# Patient Record
Sex: Female | Born: 1980 | Race: Black or African American | Hispanic: No | Marital: Married | Smoking: Never smoker
Health system: Southern US, Community
[De-identification: ages and names within clinical notes are randomized; demographics above are authoritative.]

## PROBLEM LIST (undated history)

## (undated) DIAGNOSIS — K219 Gastro-esophageal reflux disease without esophagitis: Secondary | ICD-10-CM

## (undated) DIAGNOSIS — Z1589 Genetic susceptibility to other disease: Secondary | ICD-10-CM

## (undated) DIAGNOSIS — K5900 Constipation, unspecified: Secondary | ICD-10-CM

## (undated) DIAGNOSIS — Z975 Presence of (intrauterine) contraceptive device: Secondary | ICD-10-CM

## (undated) DIAGNOSIS — F419 Anxiety disorder, unspecified: Secondary | ICD-10-CM

## (undated) DIAGNOSIS — G43909 Migraine, unspecified, not intractable, without status migrainosus: Secondary | ICD-10-CM

## (undated) HISTORY — PX: EYE SURGERY: SHX253

## (undated) HISTORY — DX: Presence of (intrauterine) contraceptive device: Z97.5

## (undated) HISTORY — PX: BREAST BIOPSY: SHX20

## (undated) HISTORY — PX: WISDOM TOOTH EXTRACTION: SHX21

## (undated) HISTORY — DX: Constipation, unspecified: K59.00

## (undated) HISTORY — DX: Genetic susceptibility to other disease: Z15.89

## (undated) HISTORY — DX: Anxiety disorder, unspecified: F41.9

## (undated) HISTORY — DX: Gastro-esophageal reflux disease without esophagitis: K21.9

---

## 1999-03-18 ENCOUNTER — Other Ambulatory Visit: Admission: RE | Admit: 1999-03-18 | Discharge: 1999-03-18 | Payer: Self-pay | Admitting: Obstetrics and Gynecology

## 2000-05-04 ENCOUNTER — Other Ambulatory Visit: Admission: RE | Admit: 2000-05-04 | Discharge: 2000-05-04 | Payer: Self-pay | Admitting: Obstetrics and Gynecology

## 2001-09-29 ENCOUNTER — Other Ambulatory Visit: Admission: RE | Admit: 2001-09-29 | Discharge: 2001-09-29 | Payer: Self-pay | Admitting: Obstetrics and Gynecology

## 2002-01-04 HISTORY — PX: EYE SURGERY: SHX253

## 2003-05-28 ENCOUNTER — Other Ambulatory Visit: Admission: RE | Admit: 2003-05-28 | Discharge: 2003-05-28 | Payer: Self-pay | Admitting: Obstetrics and Gynecology

## 2004-07-06 ENCOUNTER — Other Ambulatory Visit: Admission: RE | Admit: 2004-07-06 | Discharge: 2004-07-06 | Payer: Self-pay | Admitting: Obstetrics and Gynecology

## 2005-01-19 ENCOUNTER — Inpatient Hospital Stay (HOSPITAL_COMMUNITY): Admission: AD | Admit: 2005-01-19 | Discharge: 2005-01-21 | Payer: Self-pay | Admitting: Obstetrics and Gynecology

## 2006-10-20 ENCOUNTER — Ambulatory Visit: Payer: Self-pay | Admitting: Family Medicine

## 2006-10-20 DIAGNOSIS — G43009 Migraine without aura, not intractable, without status migrainosus: Secondary | ICD-10-CM | POA: Insufficient documentation

## 2006-10-21 ENCOUNTER — Telehealth: Payer: Self-pay | Admitting: Family Medicine

## 2007-01-26 ENCOUNTER — Telehealth: Payer: Self-pay | Admitting: Family Medicine

## 2007-02-09 ENCOUNTER — Ambulatory Visit: Payer: Self-pay | Admitting: Family Medicine

## 2007-02-09 DIAGNOSIS — G43909 Migraine, unspecified, not intractable, without status migrainosus: Secondary | ICD-10-CM

## 2007-02-09 DIAGNOSIS — J039 Acute tonsillitis, unspecified: Secondary | ICD-10-CM | POA: Insufficient documentation

## 2007-04-06 ENCOUNTER — Telehealth: Payer: Self-pay | Admitting: Family Medicine

## 2007-07-28 ENCOUNTER — Emergency Department (HOSPITAL_COMMUNITY): Admission: EM | Admit: 2007-07-28 | Discharge: 2007-07-28 | Payer: Self-pay | Admitting: Family Medicine

## 2008-02-16 ENCOUNTER — Telehealth: Payer: Self-pay | Admitting: Family Medicine

## 2008-12-12 ENCOUNTER — Ambulatory Visit: Payer: Self-pay | Admitting: Family Medicine

## 2009-03-04 ENCOUNTER — Ambulatory Visit: Payer: Self-pay | Admitting: Family Medicine

## 2009-03-04 DIAGNOSIS — H6120 Impacted cerumen, unspecified ear: Secondary | ICD-10-CM | POA: Insufficient documentation

## 2009-04-10 ENCOUNTER — Ambulatory Visit: Payer: Self-pay | Admitting: Family Medicine

## 2009-04-10 DIAGNOSIS — J309 Allergic rhinitis, unspecified: Secondary | ICD-10-CM | POA: Insufficient documentation

## 2009-07-24 ENCOUNTER — Ambulatory Visit: Payer: Self-pay | Admitting: Family Medicine

## 2009-07-24 DIAGNOSIS — L02419 Cutaneous abscess of limb, unspecified: Secondary | ICD-10-CM | POA: Insufficient documentation

## 2009-07-24 DIAGNOSIS — L03119 Cellulitis of unspecified part of limb: Secondary | ICD-10-CM

## 2009-09-23 ENCOUNTER — Telehealth: Payer: Self-pay | Admitting: Family Medicine

## 2009-12-22 ENCOUNTER — Telehealth: Payer: Self-pay | Admitting: Family Medicine

## 2009-12-24 ENCOUNTER — Telehealth: Payer: Self-pay | Admitting: Family Medicine

## 2010-02-03 NOTE — Progress Notes (Signed)
Summary: New Rx for Topamax  Phone Note Call from Patient Call back at Home Phone [phone]   Caller: Patient---live call Summary of Call: Pt like to go back on Topamax. Send to [facility] on [address]. Initial call taken by: Warnell Forester,  September 23, 2009 1:20 PM  Follow-up for Phone Call        Topamax 50 mg, dispense 30 tablets, directions one nightly follow-up office visit 3 weeks after starting medication.  No refills Follow-up by: Roderick Pee MD,  September 23, 2009 1:39 PM    New/Updated Medications: TOPAMAX 50 MG TABS (TOPIRAMATE) One tablet nightly  Call to make follow-up apt in 3 weeks after starting this medication Prescriptions: TOPAMAX 50 MG TABS (TOPIRAMATE) One tablet nightly  Call to make follow-up apt in 3 weeks after starting this medication  #50 x 0   Entered by:   Kathrynn Speed CMA   Authorized by:   Roderick Pee MD   Signed by:   Kathrynn Speed CMA on 09/23/2009   Method used:   Faxed to ...       [facility] [address] [phone]* (retail)       [address]       [place], [place]  [address]       Ph: [phone]       Fax: [phone]   RxID:   [id number]

## 2010-02-03 NOTE — Assessment & Plan Note (Signed)
Summary: severe abrasions on legs/some inf/swelling/cjr   Vital Signs:  Patient profile:   30 year old female Weight:      171 pounds Temp:     98.1 degrees F oral BP sitting:   130 / 90  (left arm) Cuff size:   regular  Vitals Entered By: Kathrynn Speed CMA (July 24, 2009 12:04 PM) CC: Severe abrasion on on both legs, right leg swollen & infection, x 5 days, src   CC:  Severe abrasion on on both legs, right leg swollen & infection, x 5 days, and src.  History of Present Illness: Mariah Ferrell is a 30 year old female, who comes in today for evaluation of an infected abrasion on her right lower extremity.  This past weekend.  She was at a water park in [place] fell out of the raft and abraded both lower extremities.  Everything is healing well, except she noticed the abrasion on her right lower extremity is getting red and tender  Current Medications (verified): 1)  Zomig Zmt 5 Mg  Tbdp (Zolmitriptan) .... Uad For Migraine 2)  Vicodin Es 7.5-750 Mg  Tabs (Hydrocodone-Acetaminophen) .... Take 1 Tablet By Mouth Three Times A Day Prn 3)  Promethazine Hcl 25 Mg  Tabs (Promethazine Hcl) .... Take 1 Tablet By Mouth Three Times A Day Prn 4)  Bcp .... Once Daily 5)  Corgard 40 Mg Tabs (Nadolol) .Marland Kitchen.. 1 Tab @ Bedtime 6)  Flonase 50 Mcg/act Susp (Fluticasone Propionate) .... Uad  Allergies (verified): 1)  ! Codeine  Past History:  Past medical, surgical, family and social histories (including risk factors) reviewed for relevance to current acute and chronic problems.  Past Medical History: Reviewed history from 12/12/2008 and no changes required. childbirth x 1, episodic migraines iritis  Family History: Reviewed history from 10/20/2006 and no changes required. cluster migraines  Social History: Reviewed history from 10/20/2006 and no changes required. Occupation:[employer], certified technician Married Never Smoked Alcohol use-no Drug use-no  Review of Systems       See HPI  Physical Exam  General:  Well-developed,well-nourished,in no acute distress; alert,appropriate and cooperative throughout examination Skin:  abrasion right knee, left lower extremity.  It looked normal, and healing well, good abrasion, right lower extremity has some erythema around the base, consistent with an early superficial cellulitis   Problems:  Medical Problems Added: 1)  Dx of Cellulitis, Right Leg  (ICD-682.6)  Impression & Recommendations:  Problem # 1:  CELLULITIS, RIGHT LEG (ICD-682.6) Assessment New  Her updated medication list for this problem includes:    Keflex 500 Mg Caps (Cephalexin) .Marland Kitchen... 2 by mouth two times a day  Orders: Prescription Created Electronically [phone])  Complete Medication List: 1)  Zomig Zmt 5 Mg Tbdp (Zolmitriptan) .... Uad for migraine 2)  Vicodin Es 7.5-750 Mg Tabs (Hydrocodone-acetaminophen) .... Take 1 tablet by mouth three times a day prn 3)  Promethazine Hcl 25 Mg Tabs (Promethazine hcl) .... Take 1 tablet by mouth three times a day prn 4)  Bcp  .... Once daily 5)  Corgard 40 Mg Tabs (Nadolol) .Marland Kitchen.. 1 tab @ bedtime 6)  Flonase 50 Mcg/act Susp (Fluticasone propionate) .... Uad 7)  Keflex 500 Mg Caps (Cephalexin) .... 2 by mouth two times a day  Patient Instructions: 1)  begin Keflex 2........ b.i.d. return p.r.n. Prescriptions: KEFLEX 500 MG CAPS (CEPHALEXIN) 2 by mouth two times a day  #40 x 1   Entered and Authorized by:   Roderick Pee MD   Signed  by:   Roderick Pee MD on 07/24/2009   Method used:   Electronically to        [facility] [phone]* (retail)       [address]       [place], [place]  [address]       Ph: [phone]       Fax: [phone]   RxID:   [id number]

## 2010-02-03 NOTE — Assessment & Plan Note (Signed)
Summary: HOARNESS/POST NASAL DRIP/COUGHING/BREATHING DIFF/CJR pt rsc a...   Vital Signs:  Patient profile:   30 year old female Weight:      168 pounds Temp:     98.2 degrees F oral BP sitting:   102 / 78  (left arm) Cuff size:   regular  Vitals Entered By: Kern Reap CMA Duncan Dull) (April 10, 2009 1:55 PM) CC: haorsness, cough   CC:  haorsness and cough.  History of Present Illness: Mariah Ferrell is a 30 year old female, nonsmoker, who comes in with a 4-day history of head congestion, postnasal drip, and cough.  She's had a history of allergic rhinitis in the past, but no asthma.  Allergies: 1)  ! Codeine  Past History:  Past medical, surgical, family and social histories (including risk factors) reviewed for relevance to current acute and chronic problems.  Past Medical History: Reviewed history from 12/12/2008 and no changes required. childbirth x 1, episodic migraines iritis  Family History: Reviewed history from 10/20/2006 and no changes required. cluster migraines  Social History: Reviewed history from 10/20/2006 and no changes required. Occupation:[employer], certified technician Married Never Smoked Alcohol use-no Drug use-no  Review of Systems      See HPI  Physical Exam  General:  Well-developed,well-nourished,in no acute distress; alert,appropriate and cooperative throughout examination Head:  Normocephalic and atraumatic without obvious abnormalities. No apparent alopecia or balding. Eyes:  No corneal or conjunctival inflammation noted. EOMI. Perrla. Funduscopic exam benign, without hemorrhages, exudates or papilledema. Vision grossly normal. Ears:  External ear exam shows no significant lesions or deformities.  Otoscopic examination reveals clear canals, tympanic membranes are intact bilaterally without bulging, retraction, inflammation or discharge. Hearing is grossly normal bilaterally. Nose:  septum in the midline, and 3+ nasal edema Mouth:   Oral mucosa and oropharynx without lesions or exudates.  Teeth in good repair. Neck:  No deformities, masses, or tenderness noted. Chest Wall:  No deformities, masses, or tenderness noted. Lungs:  Normal respiratory effort, chest expands symmetrically. Lungs are clear to auscultation, no crackles or wheezes.   Problems:  Medical Problems Added: 1)  Dx of Rhinitis  (ICD-477.9)  Impression & Recommendations:  Problem # 1:  RHINITIS (ICD-477.9) Assessment New  Her updated medication list for this problem includes:    Promethazine Hcl 25 Mg Tabs (Promethazine hcl) .Marland Kitchen... Take 1 tablet by mouth three times a day prn    Flonase 50 Mcg/act Susp (Fluticasone propionate) ..... Uad  Orders: Prescription Created Electronically [phone])  Complete Medication List: 1)  Zomig Zmt 5 Mg Tbdp (Zolmitriptan) .... Uad for migraine 2)  Vicodin Es 7.5-750 Mg Tabs (Hydrocodone-acetaminophen) .... Take 1 tablet by mouth three times a day prn 3)  Promethazine Hcl 25 Mg Tabs (Promethazine hcl) .... Take 1 tablet by mouth three times a day prn 4)  Bcp  .... Once daily 5)  Corgard 40 Mg Tabs (Nadolol) .Marland Kitchen.. 1 tab @ bedtime 6)  Flonase 50 Mcg/act Susp (Fluticasone propionate) .... Uad  Patient Instructions: 1)  u  may take plain Claritin in the morning, or  plain Zyrtec at bedtime.  Also, one shot of the steroid nasal spray up each nostril at bedtime. 2)  You may also irrigate urine this with warm salt water in a netti pot Prescriptions: FLONASE 50 MCG/ACT SUSP (FLUTICASONE PROPIONATE) UAD  #2 x 6   Entered and Authorized by:   Roderick Pee MD   Signed by:   Roderick Pee MD on 04/10/2009   Method used:  Electronically to        [facility] [phone]* (retail)       [address]       [place], [place]  [address]       Ph: [phone]       Fax: [phone]   RxID:   [id number]

## 2010-02-03 NOTE — Assessment & Plan Note (Signed)
Summary: right ear clogged/njr   Vital Signs:  Patient profile:   30 year old female Temp:     98.4 degrees F oral  Vitals Entered By: Gladis Riffle, RN (March 04, 2009 4:41 PM) CC: c/o right ear clog Is Patient Diabetic? No   CC:  c/o right ear clog.  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  She tried eardrops and flushing at home, but it didn't work.  Left ear normal  Preventive Screening-Counseling & Management  Alcohol-Tobacco     Alcohol type: rarely     Smoking Status: never  Medications Prior to Update: 1)  Zomig Zmt 5 Mg  Tbdp (Zolmitriptan) .... Uad For Migraine 2)  Vicodin Es 7.5-750 Mg  Tabs (Hydrocodone-Acetaminophen) .... Take 1 Tablet By Mouth Three Times A Day Prn 3)  Promethazine Hcl 25 Mg  Tabs (Promethazine Hcl) .... Take 1 Tablet By Mouth Three Times A Day Prn 4)  Bcp .... Once Daily 5)  Corgard 40 Mg Tabs (Nadolol) .Marland Kitchen.. 1 Tab @ Bedtime  Allergies: 1)  ! Codeine  Review of Systems      See HPI  Physical Exam  General:  Well-developed,well-nourished,in no acute distress; alert,appropriate and cooperative throughout examination Ears:  left ear canal normal right cerumen impaction removed via irrigation and suction by me   Impression & Recommendations:  Problem # 1:  CERUMEN IMPACTION, RIGHT (ICD-380.4) Assessment New  Orders: Cerumen Impaction Removal (09811)  Complete Medication List: 1)  Zomig Zmt 5 Mg Tbdp (Zolmitriptan) .... Uad for migraine 2)  Vicodin Es 7.5-750 Mg Tabs (Hydrocodone-acetaminophen) .... Take 1 tablet by mouth three times a day prn 3)  Promethazine Hcl 25 Mg Tabs (Promethazine hcl) .... Take 1 tablet by mouth three times a day prn 4)  Bcp  .... Once daily 5)  Corgard 40 Mg Tabs (Nadolol) .Marland Kitchen.. 1 tab @ bedtime

## 2010-02-05 NOTE — Progress Notes (Signed)
Summary: vicodin es  Phone Note From Pharmacy   Caller: [facility]. #[phone]* Summary of Call: pharmacy is calling to see if they should fill the viodin because the patient is allergic to codiene? Initial call taken by: Kern Reap CMA Duncan Dull),  December 24, 2009 12:21 PM  Follow-up for Phone Call        yes..........she's taking it before, with no side effects nor allergy symptoms Follow-up by: Roderick Pee MD,  December 24, 2009 12:51 PM  Additional Follow-up for Phone Call Additional follow up Details #1::        spoke with pharmacy Additional Follow-up by: Kern Reap CMA Duncan Dull),  December 24, 2009 1:10 PM

## 2010-02-05 NOTE — Progress Notes (Signed)
Summary: refill  Phone Note Refill Request Call back at Home Phone [phone] Message from:  Patient----live call  Refills Requested: Medication #1:  VICODIN ES 7.5-750 MG  TABS Take 1 tablet by mouth three times a day prn send to [facility]  Initial call taken by: Warnell Forester,  December 22, 2009 12:49 PM  Follow-up for Phone Call        Vicodin ES number 30 directions one half to one tablet t.i.d., p.r.n. for severe migraine, no refills.  She needs a 30 minute appointment in February for annual a weighted Follow-up by: Roderick Pee MD,  December 22, 2009 1:43 PM  Additional Follow-up for Phone Call Additional follow up Details #1::        Rx called to pharmacy Additional Follow-up by: Kern Reap CMA Duncan Dull),  December 22, 2009 1:49 PM

## 2010-05-22 NOTE — Discharge Summary (Signed)
NAMEKENNEDEY, DIGILIO              ACCOUNT NO.:  [id number]   MEDICAL RECORD NO.:  [id number]          PATIENT TYPE:  INP   LOCATION:  9101                          FACILITY:  [facility]   PHYSICIAN:  Huel Cote, M.D. DATE OF BIRTH:  [birth year]   DATE OF ADMISSION:  01/19/2005  DATE OF DISCHARGE:  01/21/2005                                 DISCHARGE SUMMARY   DISCHARGE DIAGNOSES:  1.  Term pregnancy at 39+ weeks, delivered.  2.  Status post normal spontaneous vaginal delivery.  3.  Group B strep positive status.  4.  Jehovah's witness, will not accept blood products.   DISCHARGE MEDICATIONS:  1.  Motrin 600 mg p.o. every six hours p.r.n.  2.  Darvocet one to two tablets p.o. every four hours p.r.n.   HOSPITAL COURSE:  The patient is a 30 year old G1, P0, who was admitted at  [redacted] weeks gestation for induction of labor given term status and favorable  cervix.  The patient was a strict Jehovah's witness and declined to take any  blood products even in a life threatening situation.  She was positive group  B strep status.  Otherwise, her prenatal care was unremarkable.   PRENATAL LABORATORY:  A positive, antibody negative, sickle normal, RPR  nonreactive, rubella immune, hepatitis B surface antigen negative, human  immunodeficiency virus nonreactive, GC negative, chlamydia negative, group B  strep positive.   PAST OBSTETRICAL HISTORY:  None.   PAST GYNECOLOGICAL HISTORY:  None.   PAST SURGICAL HISTORY:  Wisdom teeth and __________.   PAST MEDICAL HISTORY:  None.   ALLERGIES:  CODEINE.   MEDICATIONS:  None.   On admission, the patient was afebrile with stable vital signs.  Fetal heart  rate was reactive.  Cervix was 60 and 2 at a -2 station.  She was placed on  penicillin for her positive group B strep status and Pitocin to augment her  labor.  She reached complete dilation and pushed well less than 10 minutes  with a normal spontaneous vaginal delivery of a vigorous female  infant over  a small second degree laceration.  Nuchal x1 was reduced over the head.  There was a compound presentation of the right arm, which easily reduced.  Apgars were 8 and 9.  Weight was 6 pounds, 10  ounces.  Placenta delivered spontaneously.  After cord blood was obtained,  second degree laceration was repaired with 2-0 Vicryl.  Postpartum the  patient did extremely well.  Hemoglobin was 10.8 after delivery.  She had a  normal postpartum course, which was uneventful and was felt stable for  discharge home on postpartum day #2.      Huel Cote, M.D.  Electronically Signed     KR/MEDQ  D:  01/21/2005  T:  01/21/2005  Job:  [id number]

## 2012-05-22 ENCOUNTER — Ambulatory Visit (INDEPENDENT_AMBULATORY_CARE_PROVIDER_SITE_OTHER): Payer: BC Managed Care – PPO | Admitting: Family Medicine

## 2012-05-22 ENCOUNTER — Ambulatory Visit: Payer: BC Managed Care – PPO

## 2012-05-22 VITALS — BP 128/76 | HR 90 | Temp 98.3°F | Resp 16 | Ht 63.5 in | Wt 179.6 lb

## 2012-05-22 DIAGNOSIS — K5900 Constipation, unspecified: Secondary | ICD-10-CM

## 2012-05-22 DIAGNOSIS — R109 Unspecified abdominal pain: Secondary | ICD-10-CM

## 2012-05-22 LAB — POCT CBC
Hemoglobin: 12.2 g/dL (ref 12.2–16.2)
MCH, POC: 27 pg (ref 27–31.2)
MCV: 85.3 fL (ref 80–97)
MID (cbc): 0.5 (ref 0–0.9)
Platelet Count, POC: 241 10*3/uL (ref 142–424)
RBC: 4.52 M/uL (ref 4.04–5.48)
WBC: 6.2 10*3/uL (ref 4.6–10.2)

## 2012-05-22 LAB — POCT UA - MICROSCOPIC ONLY: Crystals, Ur, HPF, POC: NEGATIVE

## 2012-05-22 LAB — POCT URINALYSIS DIPSTICK
Ketones, UA: NEGATIVE
Protein, UA: NEGATIVE

## 2012-05-22 NOTE — Patient Instructions (Signed)
Drink lots of fluids.  Avoid excessive cheese bananas.  Eat lots of fruits and vegetables. Leafy green vegetables and melons and berries or especially good  Take MiraLax once or twice daily until stools were loose, then gradually decrease dose to as needed.  Take Dulcolax initially if needed to start the bowels moving better.  Take Colace one daily for stool softener.  If bowels are too were rock hard go ahead and use a fleets enema  Return if worse

## 2012-05-22 NOTE — Progress Notes (Signed)
Subjective: 32 year old lady who is here complaining of abdominal pains for the last 2 weeks. She had a couple of weeks ago on the weekend. She's been having a lot of trouble with constipation. She took some laxatives and finally got some relief. That seemed to help her. Gas-X and some other preparations did not seem to help. She has not had any abdominal surgeries. This weekend she got worse. She had cramping mid abdominal pain. She'll removed a little bit of hard stool. She tried magnesium sulfate and it still didn't work. She is on medicines for migraine including Topamax and baclofen. She is married, her husband has had a vasectomy, and she denies chance of pregnancy. Her daughter accompanied her here today.  Objective: Pleasant lady in no major distress. Her throat was clear. Neck supple without nodes. Chest clear. Heart regular without murmurs. Abdomen soft. Has generalized tenderness right upper and midabdomen and across lower and left abdomen. Bowel sounds were active.  Assessment: Abdominal pain Constipation  Plan: Check blood chemistries and labs and x-ray.  Results for orders placed in visit on 05/22/12  POCT UA - MICROSCOPIC ONLY      Result Value Range   WBC, Ur, HPF, POC 1-3     RBC, urine, microscopic 0-2     Bacteria, U Microscopic 1+     Mucus, UA neg     Epithelial cells, urine per micros 3-7     Crystals, Ur, HPF, POC neg     Casts, Ur, LPF, POC neg     Yeast, UA neg    POCT URINALYSIS DIPSTICK      Result Value Range   Color, UA amber     Clarity, UA clear     Glucose, UA neg     Bilirubin, UA large     Ketones, UA neg     Spec Grav, UA 1.015     Blood, UA trace-lysed     pH, UA 6.5     Protein, UA neg     Urobilinogen, UA 1.0     Nitrite, UA neg     Leukocytes, UA Trace    POCT CBC      Result Value Range   WBC 6.2  4.6 - 10.2 K/uL   Lymph, poc 1.8  0.6 - 3.4   POC LYMPH PERCENT 29.1  10 - 50 %L   MID (cbc) 0.5  0 - 0.9   POC MID % 7.4  0 - 12 %M   POC Granulocyte 3.9  2 - 6.9   Granulocyte percent 63.5  37 - 80 %G   RBC 4.52  4.04 - 5.48 M/uL   Hemoglobin 12.2  12.2 - 16.2 g/dL   HCT, POC 40.9  81.1 - 47.9 %   MCV 85.3  80 - 97 fL   MCH, POC 27.0  27 - 31.2 pg   MCHC 31.6 (*) 31.8 - 35.4 g/dL   RDW, POC 91.4     Platelet Count, POC 241  142 - 424 K/uL   MPV 9.3  0 - 99.8 fL   [facility] reading (PRIMARY) by  Dr. Alwyn Ren  benign-appearing abdomen. Lots of stool in colon.  Assessment: Abdominal pain secondary to constipation  Plan: Discussed treatment of the constipation with the patient. She is to return if not doing better.Marland Kitchen

## 2012-05-23 ENCOUNTER — Telehealth: Payer: Self-pay | Admitting: Radiology

## 2012-05-23 ENCOUNTER — Inpatient Hospital Stay (HOSPITAL_COMMUNITY)
Admission: EM | Admit: 2012-05-23 | Discharge: 2012-05-27 | DRG: 493 | Disposition: A | Discharge status: Home / Self Care | Payer: BC Managed Care – PPO | Attending: Internal Medicine | Admitting: Internal Medicine

## 2012-05-23 ENCOUNTER — Emergency Department (HOSPITAL_COMMUNITY): Payer: BC Managed Care – PPO

## 2012-05-23 ENCOUNTER — Encounter (HOSPITAL_COMMUNITY): Payer: Self-pay | Admitting: Emergency Medicine

## 2012-05-23 DIAGNOSIS — D62 Acute posthemorrhagic anemia: Secondary | ICD-10-CM | POA: Diagnosis not present

## 2012-05-23 DIAGNOSIS — K8051 Calculus of bile duct without cholangitis or cholecystitis with obstruction: Secondary | ICD-10-CM | POA: Diagnosis present

## 2012-05-23 DIAGNOSIS — N390 Urinary tract infection, site not specified: Secondary | ICD-10-CM | POA: Diagnosis present

## 2012-05-23 DIAGNOSIS — R7989 Other specified abnormal findings of blood chemistry: Secondary | ICD-10-CM | POA: Diagnosis present

## 2012-05-23 DIAGNOSIS — K8061 Calculus of gallbladder and bile duct with cholecystitis, unspecified, with obstruction: Principal | ICD-10-CM | POA: Diagnosis present

## 2012-05-23 DIAGNOSIS — R17 Unspecified jaundice: Secondary | ICD-10-CM | POA: Diagnosis present

## 2012-05-23 DIAGNOSIS — R1013 Epigastric pain: Secondary | ICD-10-CM | POA: Diagnosis present

## 2012-05-23 DIAGNOSIS — R7402 Elevation of levels of lactic acid dehydrogenase (LDH): Secondary | ICD-10-CM | POA: Diagnosis present

## 2012-05-23 DIAGNOSIS — R7401 Elevation of levels of liver transaminase levels: Secondary | ICD-10-CM | POA: Diagnosis present

## 2012-05-23 DIAGNOSIS — G43909 Migraine, unspecified, not intractable, without status migrainosus: Secondary | ICD-10-CM | POA: Diagnosis present

## 2012-05-23 DIAGNOSIS — K5900 Constipation, unspecified: Secondary | ICD-10-CM | POA: Diagnosis present

## 2012-05-23 DIAGNOSIS — K8050 Calculus of bile duct without cholangitis or cholecystitis without obstruction: Secondary | ICD-10-CM

## 2012-05-23 DIAGNOSIS — K8065 Calculus of gallbladder and bile duct with chronic cholecystitis with obstruction: Principal | ICD-10-CM | POA: Diagnosis present

## 2012-05-23 LAB — COMPREHENSIVE METABOLIC PANEL
ALT: 716 U/L — ABNORMAL HIGH (ref 0–35)
AST: 272 U/L — ABNORMAL HIGH (ref 0–37)
Alkaline Phosphatase: 370 U/L — ABNORMAL HIGH (ref 39–117)
BUN: 7 mg/dL (ref 6–23)
CO2: 24 mEq/L (ref 19–32)
CO2: 21 mEq/L (ref 19–32)
Calcium: 9.6 mg/dL (ref 8.4–10.5)
Chloride: 107 mEq/L (ref 96–112)
Chloride: 106 mEq/L (ref 96–112)
Creat: 0.88 mg/dL (ref 0.50–1.10)
Creatinine, Ser: 0.8 mg/dL (ref 0.50–1.10)
GFR calc non Af Amer: >90 mL/min (ref >90)
Glucose, Bld: 84 mg/dL (ref 70–99)
Potassium: 4 mEq/L (ref 3.5–5.1)
Sodium: 139 mEq/L (ref 135–145)
Total Bilirubin: 4 mg/dL — ABNORMAL HIGH (ref 0.3–1.2)

## 2012-05-23 LAB — URINALYSIS, ROUTINE W REFLEX MICROSCOPIC
Glucose, UA: NEGATIVE mg/dL
Hgb urine dipstick: NEGATIVE
Ketones, ur: 15 mg/dL — AB
Protein, ur: NEGATIVE mg/dL
pH: 5.5 (ref 5.0–8.0)

## 2012-05-23 LAB — CBC WITH DIFFERENTIAL/PLATELET
Basophils Absolute: 0.1 10*3/uL (ref 0.0–0.1)
HCT: 36.8 % (ref 36.0–46.0)
Hemoglobin: 12.2 g/dL (ref 12.0–15.0)
Lymphocytes Relative: 26 % (ref 12–46)
Monocytes Absolute: 0.6 10*3/uL (ref 0.1–1.0)
Neutro Abs: 4.1 10*3/uL (ref 1.7–7.7)
Neutrophils Relative %: 59 % (ref 43–77)
RDW: 14.2 % (ref 11.5–15.5)
WBC: 6.8 10*3/uL (ref 4.0–10.5)

## 2012-05-23 LAB — TSH: TSH: 1.178 u[IU]/mL (ref 0.350–4.500)

## 2012-05-23 LAB — URINE MICROSCOPIC-ADD ON

## 2012-05-23 MED ORDER — HYDROMORPHONE HCL PF 1 MG/ML IJ SOLN
0.5000 mg | Freq: Once | INTRAMUSCULAR | Status: AC
  Administered 2012-05-23: 0.5 mg via INTRAVENOUS
  Filled 2012-05-23: qty 1

## 2012-05-23 MED ORDER — ONDANSETRON HCL 4 MG/2ML IJ SOLN
4.0000 mg | INTRAMUSCULAR | Status: AC
  Administered 2012-05-23: 4 mg via INTRAVENOUS
  Filled 2012-05-23: qty 2

## 2012-05-23 MED ORDER — CEPHALEXIN 500 MG PO CAPS
500.0000 mg | ORAL_CAPSULE | Freq: Once | ORAL | Status: AC
  Administered 2012-05-23: 500 mg via ORAL
  Filled 2012-05-23: qty 1

## 2012-05-23 NOTE — ED Provider Notes (Signed)
History     CSN: [id number]  Arrival date & time 05/23/12  4098   First MD Initiated Contact with Patient 05/23/12 1959      Chief Complaint  Patient presents with  . Abdominal Pain  . Abnormal Labs     (Consider location/radiation/quality/duration/timing/severity/associated sxs/prior treatment) HPI Comments: Patient is a 32 year old female with no significant past medical history who presents for right upper [place] epigastric pain x10 days. Patient states the pain is "punching" in nature, has been gradually worsening since onset, is waxing waning in severity, and radiating to her mid back. Patient denies any alleviating factors of her pain, but states that eating makes her pain worse. Patient was seen at [facility] urgent care yesterday for the pain and found to have elevated AST and ALT levels. Patient had abdominal x-ray done which was significant for constipation. Patient states her last bowel movement was yesterday which was normal in color and consistency. Patient denies history of abdominal surgery. She further denies fever, chest pain, shortness of breath, vomiting, diarrhea, melena or hematochezia, urinary symptoms, and numbness or tingling in her extremities.  Patient is a 32 y.o. female presenting with abdominal pain. The history is provided by the patient. No language interpreter was used.  Abdominal Pain Associated symptoms include abdominal pain (RUQ) and nausea. Pertinent negatives include no chest pain, fever, numbness, vomiting or weakness.    History reviewed. No pertinent past medical history.  Past Surgical History  Procedure Laterality Date  . Eye surgery      Family History  Problem Relation Age of Onset  . Hypertension Mother     History  Substance Use Topics  . Smoking status: Never Smoker   . Smokeless tobacco: Not on file  . Alcohol Use: Yes    OB History   Grav Para Term Preterm Abortions TAB SAB Ect Mult Living                  Review of Systems   Constitutional: Negative for fever.  Respiratory: Negative for shortness of breath.   Cardiovascular: Negative for chest pain.  Gastrointestinal: Positive for nausea, abdominal pain (RUQ) and constipation (resolved). Negative for vomiting and diarrhea.  Genitourinary: Negative for dysuria and hematuria.  Skin: Negative for pallor.  Neurological: Negative for syncope, weakness and numbness.  All other systems reviewed and are negative.    Allergies  Codeine  Home Medications   Current Outpatient Rx  Name  Route  Sig  Dispense  Refill  . baclofen (LIORESAL) 10 MG tablet   Oral   Take 10 mg by mouth 3 (three) times daily as needed (for headaches).          . eletriptan (RELPAX) 20 MG tablet   Oral   One tablet by mouth at onset of headache. May repeat in 2 hours if headache persists or recurs. may repeat in 2 hours if necessary         . topiramate (TOPAMAX) 25 MG tablet   Oral   Take 75 mg by mouth at bedtime.            BP 140/84  Pulse 91  Temp(Src) 98.8 F (37.1 C) (Oral)  Resp 20  SpO2 100%  LMP 05/01/2012  Physical Exam  Nursing note and vitals reviewed. Constitutional: She is oriented to person, place, and time. She appears well-developed and well-nourished. No distress.  HENT:  Head: Normocephalic and atraumatic.  Mouth/Throat: Oropharynx is clear and moist. No oropharyngeal exudate.  Eyes: Conjunctivae  and EOM are normal. No scleral icterus.  Neck: Normal range of motion.  Cardiovascular: Normal rate, regular rhythm, normal heart sounds and intact distal pulses.   Pulmonary/Chest: Effort normal and breath sounds normal. No respiratory distress. She has no wheezes. She has no rales.  Abdominal: Soft. She exhibits no distension and no mass. There is tenderness (RUQ and epigatric region). There is no rebound and no guarding.  No peritoneal signs or palpable masses.  Musculoskeletal: Normal range of motion.  Neurological: She is alert and oriented to  person, place, and time.  Skin: Skin is warm and dry. No rash noted. She is not diaphoretic. No erythema. No pallor.  Psychiatric: She has a normal mood and affect. Her behavior is normal.    ED Course  Procedures (including critical care time)  Labs Reviewed  CBC WITH DIFFERENTIAL - Abnormal; Notable for the following:    Eosinophils Relative 6 (*)    All other components within normal limits  COMPREHENSIVE METABOLIC PANEL - Abnormal; Notable for the following:    AST 272 (*)    ALT 716 (*)    Alkaline Phosphatase 370 (*)    Total Bilirubin 4.0 (*)    All other components within normal limits  URINALYSIS, ROUTINE W REFLEX MICROSCOPIC - Abnormal; Notable for the following:    Color, Urine ORANGE (*)    APPearance CLOUDY (*)    Bilirubin Urine LARGE (*)    Ketones, ur 15 (*)    Urobilinogen, UA 4.0 (*)    Nitrite POSITIVE (*)    Leukocytes, UA SMALL (*)    All other components within normal limits  URINE MICROSCOPIC-ADD ON - Abnormal; Notable for the following:    Squamous Epithelial / LPF FEW (*)    Bacteria, UA FEW (*)    All other components within normal limits  URINE CULTURE  LIPASE, BLOOD  POCT PREGNANCY, URINE   US Abdomen Complete  05/23/2012   *RADIOLOGY REPORT*  Clinical Data:  Right upper quadrant pain.  Elevated liver function tests.  ABDOMINAL ULTRASOUND COMPLETE  Comparison:  None  Findings:  Gallbladder:  Multiple small less than 1 cm gallstones are seen. There is no evidence of gallbladder wall thickening or pericholecystic fluid.  Common Bile Duct:  Diffuse dilatation of the intrahepatic bile ducts is seen as well as the common bile duct.  The common bile duct measures up to 1.7 cm and at least one stone is seen within the distal common bile duct which measures approximately 7 mm.  Liver: No focal mass lesion identified.  Within normal limits in parenchymal echogenicity.  IVC:  Appears normal.  Pancreas:  No abnormality identified.  Spleen:  Within normal limits in  size and echotexture.  Right kidney:  Normal in size and parenchymal echogenicity.  No evidence of mass or hydronephrosis.  Left kidney:  Normal in size and parenchymal echogenicity.  No evidence of mass or hydronephrosis.  Abdominal Aorta:  No aneurysm identified.  IMPRESSION:  1.  Diffuse biliary ductal dilatation, with choledocholithiasis. 2.  Cholelithiasis, without other sonographic signs of acute cholecystitis.   Original Report Authenticated By: Myles Rosenthal, M.D.   Dg Abd 2 Views  05/22/2012   *RADIOLOGY REPORT*  Clinical Data: Abdominal pain  ABDOMEN - 2 VIEW  Comparison: None.  Findings: Upright film shows no evidence for intraperitoneal free air.  Supine film shows no gaseous bowel dilatation to suggest obstruction.  Prominent stool volume noted.  Visualized bony structures are normal.  IMPRESSION: Prominent stool  volume could be compatible with clinical constipation.   Original Report Authenticated By: Kennith Center, M.D.    1. UTI (urinary tract infection)   2. Choledocholithiasis     MDM  Patient is a 32 year old female with no significant past medical history who presents for right upper quadrant epigastric pain onset 10 days ago. Patient was seen in [place] urgent care and found to have elevated LFTs. Today AST 272, ALT 716. Patient also has elevated alkaline phosphatase to 370 and increased total bilirubin of 4.0. No leukocytosis. There is focal tenderness in the right upper quadrant epigastric region without peritoneal signs or palpable masses. Will obtain abdominal ultrasound to further evaluate for cholelithiasis.   Urine positive for leukocytes and nitrites; will tx with Keflex for UTI. Patient well appearing with no complaints.  U/S abdomen significant for diffuse biliary ductal dilatation. Common bile duct measuring 1.7 cm and distal common bile duct stone appreciated measuring 7 mm. No evidence of cholecystitis. Have consulted general surgery and GI to inform them of the patient.  Internal medicine to admit. Patient made NPO.     Antony Madura, PA-C 05/23/12 2333

## 2012-05-23 NOTE — ED Provider Notes (Signed)
Medical screening examination/treatment/procedure(s) were conducted as a shared visit with non-physician practitioner(s) and myself.  I personally evaluated the patient during the encounter   Delmi Fulfer, MD 05/23/12 2336 

## 2012-05-23 NOTE — ED Notes (Signed)
Pt states that she has been having abd pain off and on for 10 days.  Went to [facility] yesterday and had labs drawn and was sent here today for abnormal LFTs.  Denies NVD.  C/o constipation.  Had a BM yesterday but prior to that, it had been a week since her last one.

## 2012-05-23 NOTE — H&P (Signed)
[facility] History and Physical  MACIL CRADY HQI:[id number] DOB: 01-Dec-1980 DOA: 05/23/2012  Referring physician: er PCP: Evette Georges, MD  Specialists: Enid Baas  Chief Complaint: epigastric pain  HPI: Mariah Ferrell is a 32 y.o. female  no significant past medical history who presents for right upper epigastric pain x10 days. Patient states the pain is "punching" in nature, has been gradually worsening since onset, is waxing waning in severity, and radiating to her mid back. Patient denies any alleviating factors of her pain, but states that eating makes her pain worse. Patient was seen at [facility] urgent care yesterday for the pain and found to have elevated AST and ALT levels. Patient had abdominal x-ray done which was significant for constipation. Patient states her last bowel movement was yesterday which was normal in color and consistency.  She denies fever, chest pain, shortness of breath, vomiting, diarrhea, melena or hematochezia, urinary symptoms, and numbness or tingling in her extremities  In the ER, an U/s was done that was significant for diffuse biliary ductal dilatation. Common bile duct measuring 1.7 cm and distal common bile duct stone appreciated measuring 7 mm. No evidence of cholecystitis.  Gi consulted by Er but deferred admission to hospitalist.  Patient is a Jehovah's Witness- no blood products  Review of Systems: all systems reviewed, negative unless stated above   History reviewed. No pertinent past medical history. Past Surgical History  Procedure Laterality Date  . Eye surgery     Social History:  reports that she has never smoked. She does not have any smokeless tobacco history on file. She reports that  drinks alcohol. She reports that she does not use illicit drugs.   Allergies  Allergen Reactions  . Codeine     REACTION: nausea    Family History  Problem Relation Age of Onset  . Hypertension Mother     Prior to Admission  medications   Medication Sig Start Date End Date Taking? Authorizing Provider  baclofen (LIORESAL) 10 MG tablet Take 10 mg by mouth 3 (three) times daily as needed (for headaches).    Yes Historical Provider, MD  eletriptan (RELPAX) 20 MG tablet One tablet by mouth at onset of headache. May repeat in 2 hours if headache persists or recurs. may repeat in 2 hours if necessary   Yes Historical Provider, MD  topiramate (TOPAMAX) 25 MG tablet Take 75 mg by mouth at bedtime.    Yes Historical Provider, MD   Physical Exam: Filed Vitals:   05/23/12 1901  BP: 140/84  Pulse: 91  Temp: 98.8 F (37.1 C)  TempSrc: Oral  Resp: 20  SpO2: 100%     General: A+Ox3, NAD  Eyes: wnl  ENT: wnl  Neck: supple  Cardiovascular: rrr  Respiratory: clear anterior  Abdomen: +BS, soft, minimal RUQ tend  Skin: no rashes or lesions  Musculoskeletal: moves all 4 ext  Psychiatric: no SI/no HI, normal mood/affect  Neurologic: CN 2-12 intact  Labs on Admission:  Basic Metabolic Panel:  Recent Labs Lab 05/22/12 1226 05/23/12 1920  NA 138 139  K 4.2 4.0  CL 107 106  CO2 24 21  GLUCOSE 84 88  BUN 7 7  CREATININE 0.88 0.80  CALCIUM 9.6 9.1   Liver Function Tests:  Recent Labs Lab 05/22/12 1226 05/23/12 1920  AST 399* 272*  ALT 917* 716*  ALKPHOS 334* 370*  BILITOT 6.0* 4.0*  PROT 6.8 7.2  ALBUMIN 4.2 3.7    Recent Labs Lab 05/23/12 1920  LIPASE 26   No results found for this basename: AMMONIA,  in the last 168 hours CBC:  Recent Labs Lab 05/22/12 1229 05/23/12 1920  WBC 6.2 6.8  NEUTROABS  --  4.1  HGB 12.2 12.2  HCT 38.6 36.8  MCV 85.3 80.3  PLT  --  264   Cardiac Enzymes: No results found for this basename: CKTOTAL, CKMB, CKMBINDEX, TROPONINI,  in the last 168 hours  BNP (last 3 results) No results found for this basename: PROBNP,  in the last 8760 hours CBG: No results found for this basename: GLUCAP,  in the last 168 hours  Radiological Exams on  Admission: US Abdomen Complete  05/23/2012   *RADIOLOGY REPORT*  Clinical Data:  Right upper quadrant pain.  Elevated liver function tests.  ABDOMINAL ULTRASOUND COMPLETE  Comparison:  None  Findings:  Gallbladder:  Multiple small less than 1 cm gallstones are seen. There is no evidence of gallbladder wall thickening or pericholecystic fluid.  Common Bile Duct:  Diffuse dilatation of the intrahepatic bile ducts is seen as well as the common bile duct.  The common bile duct measures up to 1.7 cm and at least one stone is seen within the distal common bile duct which measures approximately 7 mm.  Liver: No focal mass lesion identified.  Within normal limits in parenchymal echogenicity.  IVC:  Appears normal.  Pancreas:  No abnormality identified.  Spleen:  Within normal limits in size and echotexture.  Right kidney:  Normal in size and parenchymal echogenicity.  No evidence of mass or hydronephrosis.  Left kidney:  Normal in size and parenchymal echogenicity.  No evidence of mass or hydronephrosis.  Abdominal Aorta:  No aneurysm identified.  IMPRESSION:  1.  Diffuse biliary ductal dilatation, with choledocholithiasis. 2.  Cholelithiasis, without other sonographic signs of acute cholecystitis.   Original Report Authenticated By: Myles Rosenthal, M.D.   Dg Abd 2 Views  05/22/2012   *RADIOLOGY REPORT*  Clinical Data: Abdominal pain  ABDOMEN - 2 VIEW  Comparison: None.  Findings: Upright film shows no evidence for intraperitoneal free air.  Supine film shows no gaseous bowel dilatation to suggest obstruction.  Prominent stool volume noted.  Visualized bony structures are normal.  IMPRESSION: Prominent stool volume could be compatible with clinical constipation.   Original Report Authenticated By: Kennith Center, M.D.      Assessment/Plan Active Problems:   Epigastric pain   UTI (lower urinary tract infection)   Constipation   Transaminitis   1. Epigastric/RUQ pain with stone in distal CBD- GI to see in the AM,  prob ERCP, NPO after midnight 2. UTI- rocephin 3. Constipation- resolved 4. transaminitis- see #1  GI to see in AM as they refused to admit  Code Status: full Family Communication: patient Disposition Plan: admit  Time spent: 70 min  Macenzie Burford [facility] Pager [phone]   If 7PM-7AM, please contact night-coverage [email or web address] Password [facility] 05/23/2012, 11:36 PM

## 2012-05-23 NOTE — Telephone Encounter (Signed)
Patient needs to go to the Emergency Room for her abdominal pain per Dr Alwyn Ren. Her liver function tests are very elevated, suggesting she has an acute cholecystitis. I have spoken to her to advise and she agrees to go. To you FYI

## 2012-05-23 NOTE — ED Notes (Signed)
U/S at pt bedside 

## 2012-05-24 ENCOUNTER — Encounter (HOSPITAL_COMMUNITY): Payer: Self-pay | Admitting: *Deleted

## 2012-05-24 DIAGNOSIS — K8020 Calculus of gallbladder without cholecystitis without obstruction: Secondary | ICD-10-CM

## 2012-05-24 DIAGNOSIS — N390 Urinary tract infection, site not specified: Secondary | ICD-10-CM

## 2012-05-24 DIAGNOSIS — R1013 Epigastric pain: Secondary | ICD-10-CM

## 2012-05-24 DIAGNOSIS — K8051 Calculus of bile duct without cholangitis or cholecystitis with obstruction: Secondary | ICD-10-CM | POA: Diagnosis present

## 2012-05-24 LAB — COMPREHENSIVE METABOLIC PANEL
BUN: 5 mg/dL — ABNORMAL LOW (ref 6–23)
CO2: 21 mEq/L (ref 19–32)
Chloride: 105 mEq/L (ref 96–112)
Creatinine, Ser: 0.79 mg/dL (ref 0.50–1.10)
GFR calc Af Amer: >90 mL/min (ref >90)
GFR calc non Af Amer: >90 mL/min (ref >90)
Glucose, Bld: 103 mg/dL — ABNORMAL HIGH (ref 70–99)
Total Bilirubin: 4.4 mg/dL — ABNORMAL HIGH (ref 0.3–1.2)

## 2012-05-24 LAB — URINE CULTURE: Colony Count: NO GROWTH

## 2012-05-24 LAB — CBC
MCH: 26.1 pg (ref 26.0–34.0)
Platelets: 226 10*3/uL (ref 150–400)
RBC: 4.25 MIL/uL (ref 3.87–5.11)
WBC: 6.5 10*3/uL (ref 4.0–10.5)

## 2012-05-24 MED ORDER — SENNOSIDES-DOCUSATE SODIUM 8.6-50 MG PO TABS
1.0000 | ORAL_TABLET | Freq: Every evening | ORAL | Status: DC | PRN
  Filled 2012-05-24: qty 1

## 2012-05-24 MED ORDER — ONDANSETRON HCL 4 MG/2ML IJ SOLN
4.0000 mg | Freq: Four times a day (QID) | INTRAMUSCULAR | Status: DC | PRN
  Administered 2012-05-24 (×2): 4 mg via INTRAVENOUS
  Filled 2012-05-24 (×3): qty 2

## 2012-05-24 MED ORDER — SENNOSIDES-DOCUSATE SODIUM 8.6-50 MG PO TABS
1.0000 | ORAL_TABLET | Freq: Two times a day (BID) | ORAL | Status: DC
  Administered 2012-05-24 – 2012-05-27 (×6): 1 via ORAL
  Filled 2012-05-24 (×8): qty 1

## 2012-05-24 MED ORDER — PROMETHAZINE HCL 25 MG/ML IJ SOLN
6.2500 mg | Freq: Four times a day (QID) | INTRAMUSCULAR | Status: DC | PRN
  Administered 2012-05-24 – 2012-05-25 (×4): 6.25 mg via INTRAVENOUS
  Filled 2012-05-24 (×5): qty 1

## 2012-05-24 MED ORDER — ENOXAPARIN SODIUM 40 MG/0.4ML ~~LOC~~ SOLN
40.0000 mg | SUBCUTANEOUS | Status: AC
  Administered 2012-05-24: 40 mg via SUBCUTANEOUS
  Filled 2012-05-24 (×2): qty 0.4

## 2012-05-24 MED ORDER — DEXTROSE 5 % IV SOLN
1.0000 g | Freq: Every day | INTRAVENOUS | Status: DC
  Administered 2012-05-24 (×2): 1 g via INTRAVENOUS
  Filled 2012-05-24 (×2): qty 10

## 2012-05-24 MED ORDER — BUTALBITAL-APAP-CAFFEINE 50-325-40 MG PO TABS
1.0000 | ORAL_TABLET | Freq: Once | ORAL | Status: AC
  Administered 2012-05-24: 1 via ORAL
  Filled 2012-05-24: qty 1

## 2012-05-24 MED ORDER — SODIUM CHLORIDE 0.9 % IV SOLN
INTRAVENOUS | Status: DC
  Administered 2012-05-24 – 2012-05-25 (×3): via INTRAVENOUS

## 2012-05-24 MED ORDER — TOPIRAMATE 25 MG PO TABS
75.0000 mg | ORAL_TABLET | Freq: Every day | ORAL | Status: DC
Start: 2012-05-24 — End: 2012-05-27
  Administered 2012-05-24 – 2012-05-26 (×3): 75 mg via ORAL
  Filled 2012-05-24 (×5): qty 3

## 2012-05-24 MED ORDER — HYDROMORPHONE HCL PF 1 MG/ML IJ SOLN
0.5000 mg | INTRAMUSCULAR | Status: DC | PRN
  Administered 2012-05-24 – 2012-05-25 (×5): 0.5 mg via INTRAVENOUS
  Filled 2012-05-24 (×6): qty 1

## 2012-05-24 MED ORDER — ELETRIPTAN HYDROBROMIDE 20 MG PO TABS
20.0000 mg | ORAL_TABLET | ORAL | Status: DC | PRN
  Administered 2012-05-24 (×2): 20 mg via ORAL
  Filled 2012-05-24 (×2): qty 1

## 2012-05-24 MED ORDER — ONDANSETRON HCL 4 MG PO TABS: 4.0000 mg | ORAL_TABLET | Freq: Four times a day (QID) | ORAL | Status: DC | PRN

## 2012-05-24 MED ORDER — BACLOFEN 10 MG PO TABS
10.0000 mg | ORAL_TABLET | Freq: Three times a day (TID) | ORAL | Status: DC | PRN
  Filled 2012-05-24: qty 1

## 2012-05-24 NOTE — Consult Note (Signed)
[facility] Consultation Note  Referring Provider: Dr. Marlin Canary [facility]) Primary Care Physician:  Evette Georges, MD  Reason for Consultation:  Elevated LFTs, gallstones, choledocholithiasis.  HPI: FARIN BUHMAN is a 32 y.o. female with about 10 days of post-prandial epigastric pain with radiation to back.  Symptoms progressively worsening.  No nausea, vomiting, fevers.  No blood in stool.  No prior GI problems.  Elevated LFTs, including bilirubin ~ 4.  Ultrasound showed gallstones as well as dilated CBD (1.7 cm) and evidence of at least one bile duct stone.   History reviewed. No pertinent past medical history.  Past Surgical History  Procedure Laterality Date  . Eye surgery      Prior to Admission medications   Medication Sig Start Date End Date Taking? Authorizing Provider  baclofen (LIORESAL) 10 MG tablet Take 10 mg by mouth 3 (three) times daily as needed (for headaches).    Yes Historical Provider, MD  eletriptan (RELPAX) 20 MG tablet One tablet by mouth at onset of headache. May repeat in 2 hours if headache persists or recurs. may repeat in 2 hours if necessary   Yes Historical Provider, MD  topiramate (TOPAMAX) 25 MG tablet Take 75 mg by mouth at bedtime.    Yes Historical Provider, MD    Current Facility-Administered Medications  Medication Dose Route Frequency Provider Last Rate Last Dose  . 0.9 %  sodium chloride infusion   Intravenous Continuous Joseph Art, DO 75 mL/hr at 05/24/12 0045    . baclofen (LIORESAL) tablet 10 mg  10 mg Oral TID PRN Joseph Art, DO      . cefTRIAXone (ROCEPHIN) 1 g in dextrose 5 % 50 mL IVPB  1 g Intravenous QHS Joseph Art, DO   1 g at 05/24/12 0104  . eletriptan (RELPAX) tablet 20 mg  20 mg Oral PRN Joseph Art, DO   20 mg at 05/24/12 0123  . enoxaparin (LOVENOX) injection 40 mg  40 mg Subcutaneous Q24H Joseph Art, DO      . HYDROmorphone (DILAUDID) injection 0.5 mg  0.5 mg Intravenous Q4H PRN Joseph Art,  DO   0.5 mg at 05/24/12 0715  . ondansetron (ZOFRAN) tablet 4 mg  4 mg Oral Q6H PRN Joseph Art, DO       Or  . ondansetron (ZOFRAN) injection 4 mg  4 mg Intravenous Q6H PRN Joseph Art, DO   4 mg at 05/24/12 0715  . senna-docusate (Senokot-S) tablet 1 tablet  1 tablet Oral QHS PRN Joseph Art, DO      . topiramate (TOPAMAX) tablet 75 mg  75 mg Oral QHS Joseph Art, DO        Allergies as of 05/23/2012 - Review Complete 05/23/2012  Allergen Reaction Noted  . Codeine  10/20/2006    Family History  Problem Relation Age of Onset  . Hypertension Mother     History   Social History  . Marital Status: Married    Spouse Name: N/A    Number of Children: N/A  . Years of Education: N/A   Occupational History  . Not on file.   Social History Main Topics  . Smoking status: Never Smoker   . Smokeless tobacco: Not on file  . Alcohol Use: Yes  . Drug Use: No  . Sexually Active: Yes   Other Topics Concern  . Not on file   Social History Narrative  . No narrative on file  Review of Systems: As per HPI, all others negative  Physical Exam: Vital signs in last 24 hours: Temp:  [98.2 F (36.8 C)-98.8 F (37.1 C)] 98.2 F (36.8 C) (05/21 0500) Pulse Rate:  [79-91] 79 (05/21 0500) Resp:  [18-20] 20 (05/21 0500) BP: (133-140)/(81-88) 133/88 mmHg (05/21 0500) SpO2:  [100 %] 100 % (05/21 0500) Weight:  [78.8 kg (173 lb 11.6 oz)] 78.8 kg (173 lb 11.6 oz) (05/21 0053)   General:  NAD, non-toxic appearing HEENT:  Sclera icteric.  Lab Results:  Recent Labs  05/23/12 1920 05/24/12 0350  WBC 6.8 6.5  HGB 12.2 11.1*  HCT 36.8 34.5*  PLT 264 226   BMET  Recent Labs  05/22/12 1226 05/23/12 1920 05/24/12 0350  NA 138 139 135  K 4.2 4.0 3.5  CL 107 106 105  CO2 24 21 21   GLUCOSE 84 88 103*  BUN 7 7 5*  CREATININE 0.88 0.80 0.79  CALCIUM 9.6 9.1 8.4   LFT  Recent Labs  05/24/12 0350  PROT 6.3  ALBUMIN 3.2*  AST 209*  ALT 570*  ALKPHOS 317*   BILITOT 4.4*   PT/INR No results found for this basename: LABPROT, INR,  in the last 72 hours  Studies/Results: US Abdomen Complete  05/23/2012   *RADIOLOGY REPORT*  Clinical Data:  Right upper quadrant pain.  Elevated liver function tests.  ABDOMINAL ULTRASOUND COMPLETE  Comparison:  None  Findings:  Gallbladder:  Multiple small less than 1 cm gallstones are seen. There is no evidence of gallbladder wall thickening or pericholecystic fluid.  Common Bile Duct:  Diffuse dilatation of the intrahepatic bile ducts is seen as well as the common bile duct.  The common bile duct measures up to 1.7 cm and at least one stone is seen within the distal common bile duct which measures approximately 7 mm.  Liver: No focal mass lesion identified.  Within normal limits in parenchymal echogenicity.  IVC:  Appears normal.  Pancreas:  No abnormality identified.  Spleen:  Within normal limits in size and echotexture.  Right kidney:  Normal in size and parenchymal echogenicity.  No evidence of mass or hydronephrosis.  Left kidney:  Normal in size and parenchymal echogenicity.  No evidence of mass or hydronephrosis.  Abdominal Aorta:  No aneurysm identified.  IMPRESSION:  1.  Diffuse biliary ductal dilatation, with choledocholithiasis. 2.  Cholelithiasis, without other sonographic signs of acute cholecystitis.   Original Report Authenticated By: Myles Rosenthal, M.D.   Dg Abd 2 Views  05/22/2012   *RADIOLOGY REPORT*  Clinical Data: Abdominal pain  ABDOMEN - 2 VIEW  Comparison: None.  Findings: Upright film shows no evidence for intraperitoneal free air.  Supine film shows no gaseous bowel dilatation to suggest obstruction.  Prominent stool volume noted.  Visualized bony structures are normal.  IMPRESSION: Prominent stool volume could be compatible with clinical constipation.   Original Report Authenticated By: Kennith Center, M.D.    Impression:  1.  Gallstones. 2.  Elevated LFTs with obstructive pattern on ultrasound. 3.   Biliary ductal diltation with evidence of bile duct stone(s).  Plan:  1.  Clear liquid diet today.  NPO after midnight. 2.  ERCP tomorrow for biliary sphincterotomy and likely bile duct stone extraction. 3.  Risks (up to and including bleeding, infection, perforation, pancreatitis that can be complicated by infected necrosis and death), benefits (removal of stones, alleviating blockage, decreasing risk of cholangitis or choledocholithiasis-related pancreatitis), and alternatives (watchful waiting, percutaneous transhepatic cholangiography) of ERCP were  explained to patient in detail and patient elects to proceed. 4.  Will need surgical consultation, post-ERCP, for consideration of cholecystectomy. 5.  It is noted that patient is a Jehovah's witness and will not accept blood products.   LOS: 1 day   Reeder Brisby M  05/24/2012, 8:21 AM

## 2012-05-24 NOTE — Consult Note (Signed)
Mariah Ferrell 1980/05/20  [id number].    Requesting MD: Dr. Trula Ore Rama Chief Complaint/Reason for Consult: CBD stone, abdominal pain HPI:  32 y.o. female with about 10 days of post-prandial epigastric pain with radiation to back. Symptoms progressively worsening and brought on by greasy/fatty foods. No nausea, vomiting, fevers/chills. No blood in stool. No prior GI problems other than constipation and "indigestion" from over-eating.  She went to a UC on 05/22/12 and they called her on 05/23/12 with the results of her labs: elevated LFTs, including bilirubin at 6.0 (now 4.4). Ultrasound showed gallstones as well as dilated CBD (1.7 cm) and evidence of at least one bile duct stone.  Dr. Dulce Sellar is on board with the patient and is planning on doing an ERCP with sphincterotomy tomorrow.  We were consulted regarding possible cholecystectomy after ERCP.    No history of abdominal surgery.  PMH positive for migraines, but otherwise healthy.  ROS: All systems reviewed and otherwise negative except for as above  Family History  Problem Relation Age of Onset  . Hypertension Mother     History reviewed. No pertinent past medical history.  Past Surgical History  Procedure Laterality Date  . Eye surgery      Social History:  reports that she has never smoked. She does not have any smokeless tobacco history on file. She reports that  drinks alcohol. She reports that she does not use illicit drugs.  Allergies:  Allergies  Allergen Reactions  . Codeine     REACTION: nausea    Medications Prior to Admission  Medication Sig Dispense Refill  . baclofen (LIORESAL) 10 MG tablet Take 10 mg by mouth 3 (three) times daily as needed (for headaches).       . eletriptan (RELPAX) 20 MG tablet One tablet by mouth at onset of headache. May repeat in 2 hours if headache persists or recurs. may repeat in 2 hours if necessary      . topiramate (TOPAMAX) 25 MG tablet Take 75 mg by mouth at bedtime.          Blood pressure 133/88, pulse 79, temperature 98.2 F (36.8 C), temperature source Oral, resp. rate 20, height 5\' 3"  (1.6 m), weight 78.8 kg (173 lb 11.6 oz), last menstrual period 05/01/2012, SpO2 100.00%. Physical Exam: General: pleasant, WD/WN AA female who is laying in bed in NAD HEENT: head is normocephalic, atraumatic.  Sclera are icteric.  PERRL.  Ears and nose without any masses or lesions.  Mouth is pink and moist Heart: regular, rate, and rhythm.  No obvious murmurs, gallops, or rubs noted.  Palpable pedal pulses bilaterally Lungs: CTAB, no wheezes, rhonchi, or rales noted.  Respiratory effort nonlabored Abd: soft, minimally tender in epigastric area, ND, +BS, no masses, hernias, or organomegaly MS: all 4 extremities are symmetrical with no cyanosis, clubbing, or edema Skin: warm and dry with no masses, lesions, or rashes Psych: A&Ox3 with an appropriate affect.  Results for orders placed during the hospital encounter of 05/23/12 (from the past 48 hour(s))  CBC WITH DIFFERENTIAL     Status: Abnormal   Collection Time    05/23/12  7:20 PM      Result Value Range   WBC 6.8  4.0 - 10.5 K/uL   RBC 4.58  3.87 - 5.11 MIL/uL   Hemoglobin 12.2  12.0 - 15.0 g/dL   HCT 40.9  81.1 - 91.4 %   MCV 80.3  78.0 - 100.0 fL   MCH 26.6  26.0 -  34.0 pg   MCHC 33.2  30.0 - 36.0 g/dL   RDW 86.5  78.4 - 69.6 %   Platelets 264  150 - 400 K/uL   Neutrophils Relative % 59  43 - 77 %   Neutro Abs 4.1  1.7 - 7.7 K/uL   Lymphocytes Relative 26  12 - 46 %   Lymphs Abs 1.8  0.7 - 4.0 K/uL   Monocytes Relative 8  3 - 12 %   Monocytes Absolute 0.6  0.1 - 1.0 K/uL   Eosinophils Relative 6 (*) 0 - 5 %   Eosinophils Absolute 0.4  0.0 - 0.7 K/uL   Basophils Relative 1  0 - 1 %   Basophils Absolute 0.1  0.0 - 0.1 K/uL  COMPREHENSIVE METABOLIC PANEL     Status: Abnormal   Collection Time    05/23/12  7:20 PM      Result Value Range   Sodium 139  135 - 145 mEq/L   Potassium 4.0  3.5 - 5.1 mEq/L    Chloride 106  96 - 112 mEq/L   CO2 21  19 - 32 mEq/L   Glucose, Bld 88  70 - 99 mg/dL   BUN 7  6 - 23 mg/dL   Creatinine, Ser 2.95  0.50 - 1.10 mg/dL   Calcium 9.1  8.4 - 28.4 mg/dL   Total Protein 7.2  6.0 - 8.3 g/dL   Albumin 3.7  3.5 - 5.2 g/dL   AST 132 (*) 0 - 37 U/L   ALT 716 (*) 0 - 35 U/L   Alkaline Phosphatase 370 (*) 39 - 117 U/L   Total Bilirubin 4.0 (*) 0.3 - 1.2 mg/dL   GFR calc non Af Amer >90  >90 mL/min   GFR calc Af Amer >90  >90 mL/min   Comment:            The eGFR has been calculated     using the CKD EPI equation.     This calculation has not been     validated in all clinical     situations.     eGFR's persistently     <90 mL/min signify     possible Chronic Kidney Disease.  LIPASE, BLOOD     Status: None   Collection Time    05/23/12  7:20 PM      Result Value Range   Lipase 26  11 - 59 U/L  URINALYSIS, ROUTINE W REFLEX MICROSCOPIC     Status: Abnormal   Collection Time    05/23/12  8:17 PM      Result Value Range   Color, Urine ORANGE (*) YELLOW   Comment: BIOCHEMICALS MAY BE AFFECTED BY COLOR   APPearance CLOUDY (*) CLEAR   Specific Gravity, Urine 1.023  1.005 - 1.030   pH 5.5  5.0 - 8.0   Glucose, UA NEGATIVE  NEGATIVE mg/dL   Hgb urine dipstick NEGATIVE  NEGATIVE   Bilirubin Urine LARGE (*) NEGATIVE   Ketones, ur 15 (*) NEGATIVE mg/dL   Protein, ur NEGATIVE  NEGATIVE mg/dL   Urobilinogen, UA 4.0 (*) 0.0 - 1.0 mg/dL   Nitrite POSITIVE (*) NEGATIVE   Leukocytes, UA SMALL (*) NEGATIVE  URINE MICROSCOPIC-ADD ON     Status: Abnormal   Collection Time    05/23/12  8:17 PM      Result Value Range   Squamous Epithelial / LPF FEW (*) RARE   WBC, UA 3-6  <3 WBC/hpf  Bacteria, UA FEW (*) RARE   Urine-Other MUCOUS PRESENT    POCT PREGNANCY, URINE     Status: None   Collection Time    05/23/12  8:21 PM      Result Value Range   Preg Test, Ur NEGATIVE  NEGATIVE   Comment:            THE SENSITIVITY OF THIS     METHODOLOGY IS >24 mIU/mL   COMPREHENSIVE METABOLIC PANEL     Status: Abnormal   Collection Time    05/24/12  3:50 AM      Result Value Range   Sodium 135  135 - 145 mEq/L   Potassium 3.5  3.5 - 5.1 mEq/L   Chloride 105  96 - 112 mEq/L   CO2 21  19 - 32 mEq/L   Glucose, Bld 103 (*) 70 - 99 mg/dL   BUN 5 (*) 6 - 23 mg/dL   Creatinine, Ser 7.82  0.50 - 1.10 mg/dL   Calcium 8.4  8.4 - 95.6 mg/dL   Total Protein 6.3  6.0 - 8.3 g/dL   Albumin 3.2 (*) 3.5 - 5.2 g/dL   AST 213 (*) 0 - 37 U/L   ALT 570 (*) 0 - 35 U/L   Alkaline Phosphatase 317 (*) 39 - 117 U/L   Total Bilirubin 4.4 (*) 0.3 - 1.2 mg/dL   GFR calc non Af Amer >90  >90 mL/min   GFR calc Af Amer >90  >90 mL/min   Comment:            The eGFR has been calculated     using the CKD EPI equation.     This calculation has not been     validated in all clinical     situations.     eGFR's persistently     <90 mL/min signify     possible Chronic Kidney Disease.  CBC     Status: Abnormal   Collection Time    05/24/12  3:50 AM      Result Value Range   WBC 6.5  4.0 - 10.5 K/uL   Comment: WHITE COUNT CONFIRMED ON SMEAR   RBC 4.25  3.87 - 5.11 MIL/uL   Hemoglobin 11.1 (*) 12.0 - 15.0 g/dL   HCT 08.6 (*) 57.8 - 46.9 %   MCV 81.2  78.0 - 100.0 fL   MCH 26.1  26.0 - 34.0 pg   MCHC 32.2  30.0 - 36.0 g/dL   RDW 62.9  52.8 - 41.3 %   Platelets 226  150 - 400 K/uL   US Abdomen Complete  05/23/2012   *RADIOLOGY REPORT*  Clinical Data:  Right upper quadrant pain.  Elevated liver function tests.  ABDOMINAL ULTRASOUND COMPLETE  Comparison:  None  Findings:  Gallbladder:  Multiple small less than 1 cm gallstones are seen. There is no evidence of gallbladder wall thickening or pericholecystic fluid.  Common Bile Duct:  Diffuse dilatation of the intrahepatic bile ducts is seen as well as the common bile duct.  The common bile duct measures up to 1.7 cm and at least one stone is seen within the distal common bile duct which measures approximately 7 mm.  Liver: No focal  mass lesion identified.  Within normal limits in parenchymal echogenicity.  IVC:  Appears normal.  Pancreas:  No abnormality identified.  Spleen:  Within normal limits in size and echotexture.  Right kidney:  Normal in size and parenchymal echogenicity.  No evidence of  mass or hydronephrosis.  Left kidney:  Normal in size and parenchymal echogenicity.  No evidence of mass or hydronephrosis.  Abdominal Aorta:  No aneurysm identified.  IMPRESSION:  1.  Diffuse biliary ductal dilatation, with choledocholithiasis. 2.  Cholelithiasis, without other sonographic signs of acute cholecystitis.   Original Report Authenticated By: Myles Rosenthal, M.D.   Dg Abd 2 Views  05/22/2012   *RADIOLOGY REPORT*  Clinical Data: Abdominal pain  ABDOMEN - 2 VIEW  Comparison: None.  Findings: Upright film shows no evidence for intraperitoneal free air.  Supine film shows no gaseous bowel dilatation to suggest obstruction.  Prominent stool volume noted.  Visualized bony structures are normal.  IMPRESSION: Prominent stool volume could be compatible with clinical constipation.   Original Report Authenticated By: Kennith Center, M.D.       Assessment/Plan Cholelithiasis with Choledocholelithiasis Postprandial epigastric pain Elevated LFT's (Hyperbilirubinemia &Transaminitis) CBD dilatation   Plan: 1.  Proceed with ERCP and sphincterotomy tomorrow 2.  Will discuss with Dr. Jamey Ripa about indication and timing of procedure but have time tomorrow afternoon or Friday, patient would like to have her GB out this admission if possible 3.  IVF, pain control, antiemetics 4.  Will avoid blood products per the patients wishes (Jahovah's witness) 5.  Ambulate and IS 5.  SCD's and lovenox   DORT, Lucca Ballo 05/24/2012, 10:46 AM Pager: [phone]

## 2012-05-24 NOTE — Consult Note (Signed)
Agree with A&P of MD,PA. Assuming the ERCP goes well tomorrow we will plan to recheck labs Friday am and proceed to lap chole on Friday. I have discussed the indications for laparoscopic cholecystectomy with her and provided educational material. We have discussed the risks of surgery, including general risks such as bleeding, infection, lung and heart issues etc. We have also discussed the potential for injuries to other organs, bile duct leaks, and other unexpected events. We have also talked about the fact that this may need to be converted to open under certain circumstances. We discussed the typical post op recovery and the fact that there is a good likelihood of improvement in symptoms and return to normal activity.  She understands this and wishes to proceed to schedule surgery. I believe all of her questions have been answered.

## 2012-05-24 NOTE — Progress Notes (Addendum)
[facility] PROGRESS NOTE  Mariah Ferrell ZOX:[id number] DOB: 1980/10/16 DOA: 05/23/2012 PCP: Evette Georges, MD  Brief narrative: Mariah Ferrell is an 32 y.o. female with no significant PMH who was admitted with biliary colic, transaminitis, and cholechodolithiasis.  She has been seen by Dr. Dulce Sellar of gastroenterology with plans to perform an ERCP 05/25/2012.  Assessment/Plan: Principal Problem:   Choledocholithiasis with obstruction and epigastric pain -Admitted and provided with supportive care including IV fluids, antinausea and pain medications. -Seen by gastroenterologist with plans to perform ERCP 05/25/2012. -Will need surgical consultation post ERCP for consideration of cholecystectomy.  Called this in. Active Problems:   UTI (lower urinary tract infection) -Continue empiric Rocephin. Followup urine culture results.   Constipation -Stool softeners as needed.   Transaminitis -Secondary to obstruction of the common bile duct from gallstones.  Code Status: Full. Family Communication: Husband updated at bedside. Disposition Plan: Home when stable.   Medical Consultants:  Dr. Willis Modena, Gastroenterology.  Other Consultants:  None.   Anti-infectives:  Rocephin 05/24/2012--->   HPI/Subjective: Mariah Ferrell is feeling okay at the moment. She has had Dilaudid-HP which has alleviated her abdominal pain. No nausea or vomiting. Tolerated a clear liquid breakfast without difficulty.  Objective: Filed Vitals:   05/23/12 1901 05/24/12 0053 05/24/12 0500  BP: 140/84 133/81 133/88  Pulse: 91 80 79  Temp: 98.8 F (37.1 C) 98.3 F (36.8 C) 98.2 F (36.8 C)  TempSrc: Oral Oral Oral  Resp: 20 18 20   Height:  5\' 3"  (1.6 m)   Weight:  78.8 kg (173 lb 11.6 oz)   SpO2: 100% 100% 100%    Intake/Output Summary (Last 24 hours) at 05/24/12 1008 Last data filed at 05/24/12 0600  Gross per 24 hour  Intake 443.75 ml  Output      1 ml  Net 442.75 ml     Exam: Gen:  NAD Cardiovascular:  RRR, No M/R/G Respiratory:  Lungs CTAB Gastrointestinal:  Abdomen soft, tender in the right upper quadrant. Extremities:  No C/E/C  Data Reviewed: Basic Metabolic Panel:  Recent Labs Lab 05/22/12 1226 05/23/12 1920 05/24/12 0350  NA 138 139 135  K 4.2 4.0 3.5  CL 107 106 105  CO2 24 21 21   GLUCOSE 84 88 103*  BUN 7 7 5*  CREATININE 0.88 0.80 0.79  CALCIUM 9.6 9.1 8.4   GFR Estimated Creatinine Clearance: 99.5 ml/min (by C-G formula based on Cr of 0.79). Liver Function Tests:  Recent Labs Lab 05/22/12 1226 05/23/12 1920 05/24/12 0350  AST 399* 272* 209*  ALT 917* 716* 570*  ALKPHOS 334* 370* 317*  BILITOT 6.0* 4.0* 4.4*  PROT 6.8 7.2 6.3  ALBUMIN 4.2 3.7 3.2*    Recent Labs Lab 05/23/12 1920  LIPASE 26   CBC:  Recent Labs Lab 05/23/12 1920 05/24/12 0350  WBC 6.8 6.5  NEUTROABS 4.1  --   HGB 12.2 11.1*  HCT 36.8 34.5*  MCV 80.3 81.2  PLT 264 226   Thyroid function studies  Recent Labs  05/22/12 1256  TSH 1.178   Microbiology No results found for this or any previous visit (from the past 240 hour(s)).   Procedures and Diagnostic Studies: US Abdomen Complete  05/23/2012   *RADIOLOGY REPORT*  Clinical Data:  Right upper quadrant pain.  Elevated liver function tests.  ABDOMINAL ULTRASOUND COMPLETE  Comparison:  None  Findings:  Gallbladder:  Multiple small less than 1 cm gallstones are seen. There is no evidence of gallbladder wall  thickening or pericholecystic fluid.  Common Bile Duct:  Diffuse dilatation of the intrahepatic bile ducts is seen as well as the common bile duct.  The common bile duct measures up to 1.7 cm and at least one stone is seen within the distal common bile duct which measures approximately 7 mm.  Liver: No focal mass lesion identified.  Within normal limits in parenchymal echogenicity.  IVC:  Appears normal.  Pancreas:  No abnormality identified.  Spleen:  Within normal limits in size and  echotexture.  Right kidney:  Normal in size and parenchymal echogenicity.  No evidence of mass or hydronephrosis.  Left kidney:  Normal in size and parenchymal echogenicity.  No evidence of mass or hydronephrosis.  Abdominal Aorta:  No aneurysm identified.  IMPRESSION:  1.  Diffuse biliary ductal dilatation, with choledocholithiasis. 2.  Cholelithiasis, without other sonographic signs of acute cholecystitis.   Original Report Authenticated By: Myles Rosenthal, M.D.   Dg Abd 2 Views  05/22/2012   *RADIOLOGY REPORT*  Clinical Data: Abdominal pain  ABDOMEN - 2 VIEW  Comparison: None.  Findings: Upright film shows no evidence for intraperitoneal free air.  Supine film shows no gaseous bowel dilatation to suggest obstruction.  Prominent stool volume noted.  Visualized bony structures are normal.  IMPRESSION: Prominent stool volume could be compatible with clinical constipation.   Original Report Authenticated By: Kennith Center, M.D.    Scheduled Meds: . cefTRIAXone (ROCEPHIN)  IV  1 g Intravenous QHS  . enoxaparin (LOVENOX) injection  40 mg Subcutaneous Q24H  . topiramate  75 mg Oral QHS   Continuous Infusions: . sodium chloride 75 mL/hr at 05/24/12 0045    Time spent: 25 minutes.   LOS: 1 day   Kialee Kham  [facility] Pager [phone].  If 8PM-8AM, please contact night-coverage at [email or web address], password [facility] 05/24/2012, 10:08 AM

## 2012-05-25 ENCOUNTER — Encounter (HOSPITAL_COMMUNITY)
Admission: EM | Disposition: A | Discharge status: Home / Self Care | Payer: Self-pay | Source: Home / Self Care | Attending: Internal Medicine

## 2012-05-25 ENCOUNTER — Encounter (HOSPITAL_COMMUNITY): Payer: Self-pay | Admitting: Certified Registered"

## 2012-05-25 ENCOUNTER — Inpatient Hospital Stay (HOSPITAL_COMMUNITY): Payer: BC Managed Care – PPO | Admitting: Certified Registered"

## 2012-05-25 ENCOUNTER — Inpatient Hospital Stay (HOSPITAL_COMMUNITY): Payer: BC Managed Care – PPO

## 2012-05-25 DIAGNOSIS — R945 Abnormal results of liver function studies: Secondary | ICD-10-CM

## 2012-05-25 HISTORY — PX: ERCP: SHX5425

## 2012-05-25 LAB — COMPREHENSIVE METABOLIC PANEL
ALT: 500 U/L — ABNORMAL HIGH (ref 0–35)
AST: 175 U/L — ABNORMAL HIGH (ref 0–37)
Alkaline Phosphatase: 330 U/L — ABNORMAL HIGH (ref 39–117)
CO2: 25 mEq/L (ref 19–32)
Calcium: 8.4 mg/dL (ref 8.4–10.5)
Chloride: 106 mEq/L (ref 96–112)
GFR calc non Af Amer: >90 mL/min (ref >90)
Potassium: 3.7 mEq/L (ref 3.5–5.1)
Sodium: 139 mEq/L (ref 135–145)

## 2012-05-25 LAB — SURGICAL PCR SCREEN: MRSA, PCR: NEGATIVE

## 2012-05-25 LAB — CBC
HCT: 35.8 % — ABNORMAL LOW (ref 36.0–46.0)
Hemoglobin: 11.3 g/dL — ABNORMAL LOW (ref 12.0–15.0)
MCH: 26.6 pg (ref 26.0–34.0)
MCHC: 31.6 g/dL (ref 30.0–36.0)

## 2012-05-25 SURGERY — ERCP, WITH INTERVENTION IF INDICATED
Anesthesia: General

## 2012-05-25 MED ORDER — ROCURONIUM BROMIDE 100 MG/10ML IV SOLN
INTRAVENOUS | Status: DC | PRN
  Administered 2012-05-25: 40 mg via INTRAVENOUS

## 2012-05-25 MED ORDER — PROMETHAZINE HCL 25 MG/ML IJ SOLN
INTRAMUSCULAR | Status: AC
  Filled 2012-05-25: qty 1

## 2012-05-25 MED ORDER — FENTANYL CITRATE 0.05 MG/ML IJ SOLN
100.0000 ug | Freq: Once | INTRAMUSCULAR | Status: DC
  Administered 2012-05-25 (×2): 25 ug via INTRAVENOUS

## 2012-05-25 MED ORDER — LIDOCAINE HCL 4 % MT SOLN
OROMUCOSAL | Status: DC | PRN
  Administered 2012-05-25: 4 mL via TOPICAL

## 2012-05-25 MED ORDER — CIPROFLOXACIN IN D5W 400 MG/200ML IV SOLN
400.0000 mg | Freq: Once | INTRAVENOUS | Status: AC
  Administered 2012-05-25: 400 mg via INTRAVENOUS
  Filled 2012-05-25: qty 200

## 2012-05-25 MED ORDER — NEOSTIGMINE METHYLSULFATE 1 MG/ML IJ SOLN
INTRAMUSCULAR | Status: DC | PRN
  Administered 2012-05-25: 3 mg via INTRAVENOUS

## 2012-05-25 MED ORDER — LACTATED RINGERS IV SOLN
INTRAVENOUS | Status: DC | PRN
  Administered 2012-05-25 (×2): via INTRAVENOUS

## 2012-05-25 MED ORDER — ARTIFICIAL TEARS OP OINT
TOPICAL_OINTMENT | OPHTHALMIC | Status: DC | PRN
  Administered 2012-05-25: 1 via OPHTHALMIC

## 2012-05-25 MED ORDER — SODIUM CHLORIDE 0.9 % IV SOLN: INTRAVENOUS | Status: DC

## 2012-05-25 MED ORDER — HYDROMORPHONE HCL PF 1 MG/ML IJ SOLN: 0.2500 mg | INTRAMUSCULAR | Status: DC | PRN

## 2012-05-25 MED ORDER — SCOPOLAMINE 1 MG/3DAYS TD PT72
MEDICATED_PATCH | TRANSDERMAL | Status: AC
  Filled 2012-05-25: qty 1

## 2012-05-25 MED ORDER — CHLORHEXIDINE GLUCONATE 4 % EX LIQD
1.0000 "application " | Freq: Once | CUTANEOUS | Status: AC
  Administered 2012-05-26: 1 via TOPICAL
  Filled 2012-05-25: qty 15

## 2012-05-25 MED ORDER — PROPOFOL 10 MG/ML IV BOLUS
INTRAVENOUS | Status: DC | PRN
  Administered 2012-05-25: 150 mg via INTRAVENOUS

## 2012-05-25 MED ORDER — LIDOCAINE HCL (CARDIAC) 20 MG/ML IV SOLN
INTRAVENOUS | Status: DC | PRN
  Administered 2012-05-25: 80 mg via INTRAVENOUS

## 2012-05-25 MED ORDER — LACTATED RINGERS IV SOLN
INTRAVENOUS | Status: DC
  Administered 2012-05-25 – 2012-05-26 (×2): via INTRAVENOUS

## 2012-05-25 MED ORDER — FENTANYL CITRATE 0.05 MG/ML IJ SOLN
INTRAMUSCULAR | Status: DC | PRN
  Administered 2012-05-25 (×2): 50 ug via INTRAVENOUS

## 2012-05-25 MED ORDER — CHLORHEXIDINE GLUCONATE 4 % EX LIQD
1.0000 "application " | Freq: Once | CUTANEOUS | Status: AC
  Administered 2012-05-25: 1 via TOPICAL
  Filled 2012-05-25: qty 15

## 2012-05-25 MED ORDER — PROMETHAZINE HCL 25 MG/ML IJ SOLN
6.2500 mg | INTRAMUSCULAR | Status: DC | PRN
  Administered 2012-05-25: 12.5 mg via INTRAVENOUS

## 2012-05-25 MED ORDER — SCOPOLAMINE 1 MG/3DAYS TD PT72
MEDICATED_PATCH | TRANSDERMAL | Status: DC | PRN
  Administered 2012-05-25: 1 via TRANSDERMAL

## 2012-05-25 MED ORDER — ONDANSETRON HCL 4 MG/2ML IJ SOLN
INTRAMUSCULAR | Status: DC | PRN
  Administered 2012-05-25 (×2): 4 mg via INTRAVENOUS

## 2012-05-25 MED ORDER — MIDAZOLAM HCL 5 MG/5ML IJ SOLN
INTRAMUSCULAR | Status: DC | PRN
  Administered 2012-05-25: 2 mg via INTRAVENOUS

## 2012-05-25 MED ORDER — SODIUM CHLORIDE 0.9 % IV SOLN
INTRAVENOUS | Status: DC | PRN
  Administered 2012-05-25 (×2)

## 2012-05-25 MED ORDER — GLYCOPYRROLATE 0.2 MG/ML IJ SOLN
INTRAMUSCULAR | Status: DC | PRN
  Administered 2012-05-25: 0.4 mg via INTRAVENOUS

## 2012-05-25 MED ORDER — FENTANYL CITRATE 0.05 MG/ML IJ SOLN
INTRAMUSCULAR | Status: AC
  Administered 2012-05-25: 50 ug via INTRAVENOUS
  Filled 2012-05-25: qty 2

## 2012-05-25 NOTE — Progress Notes (Signed)
Day of Surgery  Subjective: Pt feeling fine today, minimal abdominal pain, no N/V.  Ready to have her procedure.  Its being moved to [facility] since the [facility] was out in the OR.  She will return today and have her lap chole here at [facility] tomorrow.  Pt ambulating well, +flatus, no BM yet.    Objective: Vital signs in last 24 hours: Temp:  [98 F (36.7 C)-98.5 F (36.9 C)] 98 F (36.7 C) (05/22 0610) Pulse Rate:  [64-71] 65 (05/22 0610) Resp:  [18-20] 20 (05/22 0610) BP: (124-126)/(70-85) 126/81 mmHg (05/22 0610) SpO2:  [100 %] 100 % (05/22 0610) Last BM Date: 05/22/12  Intake/Output from previous day: 05/21 0701 - 05/22 0700 In: 2582.5 [P.O.:720; I.V.:1862.5] Out: 2150 [Urine:2150] Intake/Output this shift:    PE: Gen:  Alert, NAD, pleasant Abd: Soft, minimal tenderness in epigastrium, ND, +BS, no HSM   Lab Results:   Recent Labs  05/24/12 0350 05/25/12 0419  WBC 6.5 4.7  HGB 11.1* 11.3*  HCT 34.5* 35.8*  PLT 226 252   BMET  Recent Labs  05/24/12 0350 05/25/12 0419  NA 135 139  K 3.5 3.7  CL 105 106  CO2 21 25  GLUCOSE 103* 86  BUN 5* 3*  CREATININE 0.79 0.71  CALCIUM 8.4 8.4   PT/INR No results found for this basename: LABPROT, INR,  in the last 72 hours CMP     Component Value Date/Time   NA 139 05/25/2012 0419   K 3.7 05/25/2012 0419   CL 106 05/25/2012 0419   CO2 25 05/25/2012 0419   GLUCOSE 86 05/25/2012 0419   BUN 3* 05/25/2012 0419   CREATININE 0.71 05/25/2012 0419   CREATININE 0.88 05/22/2012 1226   CALCIUM 8.4 05/25/2012 0419   PROT 6.3 05/25/2012 0419   ALBUMIN 3.1* 05/25/2012 0419   AST 175* 05/25/2012 0419   ALT 500* 05/25/2012 0419   ALKPHOS 330* 05/25/2012 0419   BILITOT 2.1* 05/25/2012 0419   GFRNONAA >90 05/25/2012 0419   GFRAA >90 05/25/2012 0419   Lipase     Component Value Date/Time   LIPASE 26 05/23/2012 1920       Studies/Results: US Abdomen Complete  05/23/2012   *RADIOLOGY REPORT*  Clinical Data:  Right upper quadrant pain.  Elevated  liver function tests.  ABDOMINAL ULTRASOUND COMPLETE  Comparison:  None  Findings:  Gallbladder:  Multiple small less than 1 cm gallstones are seen. There is no evidence of gallbladder wall thickening or pericholecystic fluid.  Common Bile Duct:  Diffuse dilatation of the intrahepatic bile ducts is seen as well as the common bile duct.  The common bile duct measures up to 1.7 cm and at least one stone is seen within the distal common bile duct which measures approximately 7 mm.  Liver: No focal mass lesion identified.  Within normal limits in parenchymal echogenicity.  IVC:  Appears normal.  Pancreas:  No abnormality identified.  Spleen:  Within normal limits in size and echotexture.  Right kidney:  Normal in size and parenchymal echogenicity.  No evidence of mass or hydronephrosis.  Left kidney:  Normal in size and parenchymal echogenicity.  No evidence of mass or hydronephrosis.  Abdominal Aorta:  No aneurysm identified.  IMPRESSION:  1.  Diffuse biliary ductal dilatation, with choledocholithiasis. 2.  Cholelithiasis, without other sonographic signs of acute cholecystitis.   Original Report Authenticated By: Myles Rosenthal, M.D.    Anti-infectives: Anti-infectives   Start     Dose/Rate Route Frequency Ordered  Stop   05/24/12 0045  cefTRIAXone (ROCEPHIN) 1 g in dextrose 5 % 50 mL IVPB     1 g 100 mL/hr over 30 Minutes Intravenous Daily at bedtime 05/24/12 0033     05/23/12 2200  cephALEXin (KEFLEX) capsule 500 mg     500 mg Oral  Once 05/23/12 2145 05/23/12 2230       Assessment/Plan Cholelithiasis with Choledocholelithiasis  Postprandial epigastric pain  Elevated LFT's (Hyperbilirubinemia &Transaminitis)  CBD dilatation   Plan:  1. Proceed with ERCP and sphincterotomy today at [facility] 2. OR tomorrow for Lap chole with IOC 3. IVF, pain control, antiemetics  4. Will avoid blood products per the patients wishes (Jahovah's witness), pt asks if a cell saver can be used and will allow epo drugs if  needed 5. Ambulate and IS  5. SCD's and lovenox will order to hold for surgery     LOS: 2 days    DORT, [facility] 05/25/2012, 9:08 AM Pager: [phone]

## 2012-05-25 NOTE — Interval H&P Note (Signed)
History and Physical Interval Note:  05/25/2012 1:05 PM  Mariah Ferrell  has presented today for surgery, with the diagnosis of STONES  The various methods of treatment have been discussed with the patient and family. After consideration of risks, benefits and other options for treatment, the patient has consented to  Procedure(s): ENDOSCOPIC RETROGRADE CHOLANGIOPANCREATOGRAPHY (ERCP) (N/A) as a surgical intervention .  The patient's history has been reviewed, patient examined, no change in status, stable for surgery.  I have reviewed the patient's chart and labs.  Questions were answered to the patient's satisfaction.     Willer Osorno M  Assessment:  1.  Dilated bile duct. 2.  Elevated LFTs. 3.  Gallstones. 4.  Bile duct stone(s) seen on abdominal ultrasound  Plan:  1.  ERCP with biliary sphincterotomy and possible bile duct stone extraction. 2.  Risks (up to and including bleeding, infection, perforation, pancreatitis that can be complicated by infected necrosis and death), benefits (removal of stones, alleviating blockage, decreasing risk of cholangitis or choledocholithiasis-related pancreatitis), and alternatives (watchful waiting, percutaneous transhepatic cholangiography) of ERCP were explained to patient in detail and patient elects to proceed.

## 2012-05-25 NOTE — Progress Notes (Signed)
Agree with A&P of MD,PA. Patient very comfortable. Reviewed plans risks etc again today. Will check labs in am and plan surgery tomorrow. Currently scheduled for 11:00

## 2012-05-25 NOTE — Progress Notes (Signed)
[facility] PROGRESS NOTE  [name] XBM:[id number] DOB: [birth year] DOA: 05/23/2012 PCP: Evette Georges, MD  Brief narrative: Mariah Ferrell is an 32 y.o. female with no significant PMH who was admitted with biliary colic, transaminitis, and cholechodolithiasis.  She has been seen by Dr. Dulce Sellar of gastroenterology with plans to perform an ERCP 05/25/2012 and by Dr. Jamey Ripa of general surgery with plans to undergo laparoscopic cholecystectomy on 05/26/2012.  Assessment/Plan: Principal Problem:   Choledocholithiasis with obstruction and epigastric pain -Admitted and provided with supportive care including IV fluids, antinausea and pain medications. -Seen by gastroenterologist with plans to perform ERCP 05/25/2012. -Elective cholecystectomy tentatively scheduled for 05/26/2012. Active Problems:   Migraine headache -May be induced by Zofran. Zofran discontinued. Continue Relpax PRN.   UTI (lower urinary tract infection) -Urine culture negative, discontinue empiric Rocephin.    Constipation -Stool softeners as needed.   Transaminitis -Secondary to obstruction of the common bile duct from gallstones.  Code Status: Full. Family Communication: Husband updated at bedside. Disposition Plan: Home when stable.   Medical Consultants:  Dr. Willis Modena, Gastroenterology.  Other Consultants:  None.   Anti-infectives:  Rocephin 05/24/2012--->   HPI/Subjective: Mariah Ferrell reports that her pain and nausea are under control. No new complaints today.  Objective: Filed Vitals:   05/24/12 0500 05/24/12 1500 05/24/12 2254 05/25/12 0610  BP: 133/88 124/85 124/70 126/81  Pulse: 79 71 64 65  Temp: 98.2 F (36.8 C) 98.5 F (36.9 C) 98.1 F (36.7 C) 98 F (36.7 C)  TempSrc: Oral Oral Oral Oral  Resp: 20 18 20 20   Height:      Weight:      SpO2: 100% 100% 100% 100%    Intake/Output Summary (Last 24 hours) at 05/25/12 0945 Last data filed at 05/25/12 0900  Gross  per 24 hour  Intake 2582.5 ml  Output   2750 ml  Net -167.5 ml    Exam: Gen:  NAD Cardiovascular:  RRR, No M/R/G Respiratory:  Lungs CTAB Gastrointestinal:  Abdomen soft, tender in the right upper quadrant. Extremities:  No C/E/C  Data Reviewed: Basic Metabolic Panel:  Recent Labs Lab 05/22/12 1226 05/23/12 1920 05/24/12 0350 05/25/12 0419  NA 138 139 135 139  K 4.2 4.0 3.5 3.7  CL 107 106 105 106  CO2 24 21 21 25   GLUCOSE 84 88 103* 86  BUN 7 7 5* 3*  CREATININE 0.88 0.80 0.79 0.71  CALCIUM 9.6 9.1 8.4 8.4   GFR Estimated Creatinine Clearance: 99.5 ml/min (by C-G formula based on Cr of 0.71). Liver Function Tests:  Recent Labs Lab 05/22/12 1226 05/23/12 1920 05/24/12 0350 05/25/12 0419  AST 399* 272* 209* 175*  ALT 917* 716* 570* 500*  ALKPHOS 334* 370* 317* 330*  BILITOT 6.0* 4.0* 4.4* 2.1*  PROT 6.8 7.2 6.3 6.3  ALBUMIN 4.2 3.7 3.2* 3.1*    Recent Labs Lab 05/23/12 1920  LIPASE 26   CBC:  Recent Labs Lab 05/23/12 1920 05/24/12 0350 05/25/12 0419  WBC 6.8 6.5 4.7  NEUTROABS 4.1  --   --   HGB 12.2 11.1* 11.3*  HCT 36.8 34.5* 35.8*  MCV 80.3 81.2 84.2  PLT 264 226 252   Thyroid function studies  Recent Labs  05/22/12 1256  TSH 1.178   Microbiology Recent Results (from the past 240 hour(s))  URINE CULTURE     Status: None   Collection Time    05/23/12  8:17 PM      Result Value  Range Status   Specimen Description URINE, CLEAN CATCH   Final   Special Requests NONE   Final   Culture  Setup Time 05/24/2012 01:31   Final   Colony Count NO GROWTH   Final   Culture NO GROWTH   Final   Report Status 05/24/2012 FINAL   Final     Procedures and Diagnostic Studies: US Abdomen Complete  05/23/2012   *RADIOLOGY REPORT*  Clinical Data:  Right upper quadrant pain.  Elevated liver function tests.  ABDOMINAL ULTRASOUND COMPLETE  Comparison:  None  Findings:  Gallbladder:  Multiple small less than 1 cm gallstones are seen. There is no  evidence of gallbladder wall thickening or pericholecystic fluid.  Common Bile Duct:  Diffuse dilatation of the intrahepatic bile ducts is seen as well as the common bile duct.  The common bile duct measures up to 1.7 cm and at least one stone is seen within the distal common bile duct which measures approximately 7 mm.  Liver: No focal mass lesion identified.  Within normal limits in parenchymal echogenicity.  IVC:  Appears normal.  Pancreas:  No abnormality identified.  Spleen:  Within normal limits in size and echotexture.  Right kidney:  Normal in size and parenchymal echogenicity.  No evidence of mass or hydronephrosis.  Left kidney:  Normal in size and parenchymal echogenicity.  No evidence of mass or hydronephrosis.  Abdominal Aorta:  No aneurysm identified.  IMPRESSION:  1.  Diffuse biliary ductal dilatation, with choledocholithiasis. 2.  Cholelithiasis, without other sonographic signs of acute cholecystitis.   Original Report Authenticated By: Myles Rosenthal, M.D.   Dg Abd 2 Views  05/22/2012   *RADIOLOGY REPORT*  Clinical Data: Abdominal pain  ABDOMEN - 2 VIEW  Comparison: None.  Findings: Upright film shows no evidence for intraperitoneal free air.  Supine film shows no gaseous bowel dilatation to suggest obstruction.  Prominent stool volume noted.  Visualized bony structures are normal.  IMPRESSION: Prominent stool volume could be compatible with clinical constipation.   Original Report Authenticated By: Kennith Center, M.D.    Scheduled Meds: . cefTRIAXone (ROCEPHIN)  IV  1 g Intravenous QHS  . senna-docusate  1 tablet Oral BID  . topiramate  75 mg Oral QHS   Continuous Infusions: . sodium chloride 75 mL/hr at 05/25/12 0317  . sodium chloride      Time spent: 25 minutes.   LOS: 2 days   Twylia Oka  [facility] Pager [phone].  If 8PM-8AM, please contact night-coverage at [email or web address], password [facility] 05/25/2012, 9:45 AM

## 2012-05-25 NOTE — Op Note (Signed)
[facility] [address] [place], [address]   ERCP PROCEDURE REPORT  PATIENT: Mariah Ferrell, Mariah Ferrell.  MR# :[id number] BIRTHDATE: 06/16/1980  GENDER: Female ENDOSCOPIST: Willis Modena, MD REFERRED BY: [facility] PROCEDURE DATE:  05/25/2012 PROCEDURE:   ERCP with sphincterotomy/papillotomy and ERCP with removal of calculus/calculi ASA CLASS:    ASA-I INDICATIONS:  Bile duct stones (seen on abdominal ultrasound); dilated bile duct; gallstones; elevated LFTs MEDICATIONS:    General endotracheal anesthesia (GETA); Ciprofloxacin 400 mg IV  DESCRIPTION OF PROCEDURE:   After the risks benefits and alternatives of the procedure were thoroughly explained, informed consent was obtained.  The Pentax Ercp Scope [id number]  endoscope was introduced through the mouth and advanced to the second portion of the duodenum .     FINDINGS:  Normal-appearing ampulla with scant biliary drainage pre-instrumentation.  Deep biliary access achieved.  Bile duct about 10mm in diameter with hazy filling defects in the distal bile duct, consistent with small stones.  Moderate biliary sphincterotomy performed with blended current without immediate complication.  Trapezoidal basket used to extract several small triangular light-green colored stones.  Post-occlusion cholangiogram showed no obvious residual filling defects, with gradual tapering of the distal bile duct without obvious evidence of biliary stricture.  There was some periampullary edema post stone extraction and sphincterotomy, but there was fairly good bile flow per ampulla post-instrumentation.  I feel the periampullary edema will resolve with time, and did not feel there was need for bile duct stent placement.  Pancreatogram was not obtained, intentionally.  ENDOSCOPIC IMPRESSION:  As above.  Successful removal of several bile duct stones.  RECOMMENDATIONS: 1.  Watch for potential complications of  procedure. 2.  No ASA/NSAIDs x 5 days post-sphincterotomy. 3.  Clear liquid diet today, NPO after midnight. 4.  If no obvious complications after today's procedure, would consider cholecystectomy (tentatively planned for tomorrow, 05/26/12).     _______________________________ Rosalie DoctorWillis Modena, MD 05/25/2012 4:07 PM   CC:

## 2012-05-25 NOTE — Transfer of Care (Signed)
Immediate Anesthesia Transfer of Care Note  Patient: Mariah Ferrell  Procedure(s) Performed: Procedure(s): ENDOSCOPIC RETROGRADE CHOLANGIOPANCREATOGRAPHY (ERCP) (N/A)  Patient Location: PACU  Anesthesia Type:General  Level of Consciousness: awake  Airway & Oxygen Therapy: Patient Spontanous Breathing and Patient connected to nasal cannula oxygen  Post-op Assessment: Report given to PACU RN and Post -op Vital signs reviewed and stable  Post vital signs: Reviewed and stable  Complications: No apparent anesthesia complications

## 2012-05-25 NOTE — Anesthesia Preprocedure Evaluation (Addendum)
Anesthesia Evaluation  Patient identified by MRN, date of birth, ID band Patient awake    Reviewed: Allergy & Precautions, H&P , NPO status , Patient's Chart, lab work & pertinent test results, reviewed documented beta blocker date and time   Airway Mallampati: I TM Distance: >3 FB Neck ROM: Full    Dental  (+) Dental Advisory Given and Teeth Intact   Pulmonary  breath sounds clear to auscultation        Cardiovascular negative cardio ROS  Rhythm:Regular Rate:Normal     Neuro/Psych  Headaches,    GI/Hepatic negative GI ROS, Neg liver ROS, Increased LFTs   Endo/Other    Renal/GU negative Renal ROS     Musculoskeletal   Abdominal   Peds  Hematology  (+) Blood dyscrasia, , Jehovah witness   Anesthesia Other Findings   Reproductive/Obstetrics                          Anesthesia Physical Anesthesia Plan  ASA: I  Anesthesia Plan: General   Post-op Pain Management:    Induction: Intravenous  Airway Management Planned: Oral ETT  Additional Equipment:   Intra-op Plan:   Post-operative Plan: Extubation in OR  Informed Consent: I have reviewed the patients History and Physical, chart, labs and discussed the procedure including the risks, benefits and alternatives for the proposed anesthesia with the patient or authorized representative who has indicated his/her understanding and acceptance.   Dental advisory given  Plan Discussed with: CRNA and Surgeon  Anesthesia Plan Comments:         Anesthesia Quick Evaluation

## 2012-05-26 ENCOUNTER — Encounter (HOSPITAL_COMMUNITY): Payer: Self-pay

## 2012-05-26 ENCOUNTER — Inpatient Hospital Stay (HOSPITAL_COMMUNITY): Payer: BC Managed Care – PPO | Admitting: Anesthesiology

## 2012-05-26 ENCOUNTER — Encounter (HOSPITAL_COMMUNITY)
Admission: EM | Disposition: A | Discharge status: Home / Self Care | Payer: Self-pay | Source: Home / Self Care | Attending: Internal Medicine

## 2012-05-26 ENCOUNTER — Encounter (HOSPITAL_COMMUNITY): Payer: Self-pay | Admitting: Anesthesiology

## 2012-05-26 ENCOUNTER — Inpatient Hospital Stay (HOSPITAL_COMMUNITY): Payer: BC Managed Care – PPO

## 2012-05-26 DIAGNOSIS — K8010 Calculus of gallbladder with chronic cholecystitis without obstruction: Secondary | ICD-10-CM

## 2012-05-26 HISTORY — PX: CHOLECYSTECTOMY: SHX55

## 2012-05-26 LAB — HEPATIC FUNCTION PANEL
ALT: 348 U/L — ABNORMAL HIGH (ref 0–35)
AST: 87 U/L — ABNORMAL HIGH (ref 0–37)
Albumin: 3 g/dL — ABNORMAL LOW (ref 3.5–5.2)
Alkaline Phosphatase: 290 U/L — ABNORMAL HIGH (ref 39–117)
Total Protein: 6.3 g/dL (ref 6.0–8.3)

## 2012-05-26 LAB — CBC
HCT: 33.5 % — ABNORMAL LOW (ref 36.0–46.0)
Hemoglobin: 11.1 g/dL — ABNORMAL LOW (ref 12.0–15.0)
MCH: 27.4 pg (ref 26.0–34.0)
MCHC: 33.1 g/dL (ref 30.0–36.0)
MCV: 82.7 fL (ref 78.0–100.0)
RDW: 14.2 % (ref 11.5–15.5)

## 2012-05-26 SURGERY — LAPAROSCOPIC CHOLECYSTECTOMY WITH INTRAOPERATIVE CHOLANGIOGRAM
Anesthesia: General | Wound class: Clean Contaminated

## 2012-05-26 MED ORDER — ROCURONIUM BROMIDE 100 MG/10ML IV SOLN
INTRAVENOUS | Status: DC | PRN
  Administered 2012-05-26: 35 mg via INTRAVENOUS

## 2012-05-26 MED ORDER — HEMOSTATIC AGENTS (NO CHARGE) OPTIME
TOPICAL | Status: DC | PRN
  Administered 2012-05-26: 1 via TOPICAL

## 2012-05-26 MED ORDER — IOHEXOL 300 MG/ML  SOLN
INTRAMUSCULAR | Status: AC
  Filled 2012-05-26: qty 1

## 2012-05-26 MED ORDER — LIDOCAINE HCL (CARDIAC) 20 MG/ML IV SOLN
INTRAVENOUS | Status: DC | PRN
  Administered 2012-05-26: 100 mg via INTRAVENOUS

## 2012-05-26 MED ORDER — BUPIVACAINE HCL (PF) 0.25 % IJ SOLN
INTRAMUSCULAR | Status: AC
  Filled 2012-05-26: qty 30

## 2012-05-26 MED ORDER — NEOSTIGMINE METHYLSULFATE 1 MG/ML IJ SOLN
INTRAMUSCULAR | Status: DC | PRN
  Administered 2012-05-26: 3 mg via INTRAVENOUS

## 2012-05-26 MED ORDER — MIDAZOLAM HCL 5 MG/5ML IJ SOLN
INTRAMUSCULAR | Status: DC | PRN
  Administered 2012-05-26: 2 mg via INTRAVENOUS

## 2012-05-26 MED ORDER — IOHEXOL 300 MG/ML  SOLN
INTRAMUSCULAR | Status: DC | PRN
  Administered 2012-05-26: 50 mL

## 2012-05-26 MED ORDER — LIDOCAINE HCL 4 % MT SOLN
OROMUCOSAL | Status: DC | PRN
  Administered 2012-05-26: 4 mL via TOPICAL

## 2012-05-26 MED ORDER — HYDROMORPHONE HCL PF 2 MG/ML IJ SOLN
2.0000 mg | INTRAMUSCULAR | Status: DC | PRN
  Administered 2012-05-26 (×2): 2 mg via INTRAVENOUS
  Filled 2012-05-26 (×2): qty 1

## 2012-05-26 MED ORDER — CEFAZOLIN SODIUM-DEXTROSE 2-3 GM-% IV SOLR: 2.0000 g | Freq: Once | INTRAVENOUS | Status: DC

## 2012-05-26 MED ORDER — SUCCINYLCHOLINE CHLORIDE 20 MG/ML IJ SOLN
INTRAMUSCULAR | Status: DC | PRN
  Administered 2012-05-26: 100 mg via INTRAVENOUS

## 2012-05-26 MED ORDER — PROPOFOL 10 MG/ML IV BOLUS
INTRAVENOUS | Status: DC | PRN
  Administered 2012-05-26: 150 mg via INTRAVENOUS

## 2012-05-26 MED ORDER — CEFAZOLIN SODIUM-DEXTROSE 2-3 GM-% IV SOLR
INTRAVENOUS | Status: DC | PRN
  Administered 2012-05-26: 2 g via INTRAVENOUS

## 2012-05-26 MED ORDER — KETOROLAC TROMETHAMINE 30 MG/ML IJ SOLN: 15.0000 mg | Freq: Once | INTRAMUSCULAR | Status: DC | PRN

## 2012-05-26 MED ORDER — OXYCODONE-ACETAMINOPHEN 5-325 MG PO TABS
1.0000 | ORAL_TABLET | ORAL | Status: DC | PRN
  Administered 2012-05-26: 2 via ORAL
  Filled 2012-05-26: qty 2

## 2012-05-26 MED ORDER — 0.9 % SODIUM CHLORIDE (POUR BTL) OPTIME
TOPICAL | Status: DC | PRN
  Administered 2012-05-26: 1000 mL

## 2012-05-26 MED ORDER — PROMETHAZINE HCL 25 MG/ML IJ SOLN: 6.2500 mg | INTRAMUSCULAR | Status: DC | PRN

## 2012-05-26 MED ORDER — CEFAZOLIN SODIUM-DEXTROSE 2-3 GM-% IV SOLR
INTRAVENOUS | Status: AC
  Filled 2012-05-26: qty 50

## 2012-05-26 MED ORDER — LACTATED RINGERS IV SOLN
INTRAVENOUS | Status: DC | PRN
  Administered 2012-05-26: 1000 mL

## 2012-05-26 MED ORDER — SCOPOLAMINE 1 MG/3DAYS TD PT72: 1.0000 | MEDICATED_PATCH | Freq: Once | TRANSDERMAL | Status: DC

## 2012-05-26 MED ORDER — ONDANSETRON HCL 4 MG/2ML IJ SOLN
INTRAMUSCULAR | Status: DC | PRN
  Administered 2012-05-26: 4 mg via INTRAVENOUS

## 2012-05-26 MED ORDER — LACTATED RINGERS IV SOLN
INTRAVENOUS | Status: DC
  Administered 2012-05-26: 12:00:00 via INTRAVENOUS

## 2012-05-26 MED ORDER — HYDROMORPHONE HCL PF 1 MG/ML IJ SOLN
0.2500 mg | INTRAMUSCULAR | Status: DC | PRN
  Administered 2012-05-26 (×4): 0.5 mg via INTRAVENOUS

## 2012-05-26 MED ORDER — FENTANYL CITRATE 0.05 MG/ML IJ SOLN
INTRAMUSCULAR | Status: DC | PRN
  Administered 2012-05-26 (×3): 50 ug via INTRAVENOUS
  Administered 2012-05-26 (×3): 100 ug via INTRAVENOUS
  Administered 2012-05-26: 50 ug via INTRAVENOUS
  Administered 2012-05-26: 100 ug via INTRAVENOUS

## 2012-05-26 MED ORDER — HEPARIN SODIUM (PORCINE) 5000 UNIT/ML IJ SOLN
5000.0000 [IU] | Freq: Three times a day (TID) | INTRAMUSCULAR | Status: DC
  Administered 2012-05-27: 5000 [IU] via SUBCUTANEOUS
  Filled 2012-05-26 (×3): qty 1

## 2012-05-26 MED ORDER — GLYCOPYRROLATE 0.2 MG/ML IJ SOLN
INTRAMUSCULAR | Status: DC | PRN
  Administered 2012-05-26: 0.4 mg via INTRAVENOUS

## 2012-05-26 MED ORDER — BUPIVACAINE HCL (PF) 0.25 % IJ SOLN
INTRAMUSCULAR | Status: DC | PRN
  Administered 2012-05-26: 30 mL

## 2012-05-26 MED ORDER — DEXAMETHASONE SODIUM PHOSPHATE 10 MG/ML IJ SOLN
INTRAMUSCULAR | Status: DC | PRN
  Administered 2012-05-26: 8 mg via INTRAVENOUS

## 2012-05-26 SURGICAL SUPPLY — 42 items
APPLIER CLIP ROT 10 11.4 M/L (STAPLE) ×2
CANISTER SUCTION 2500CC (MISCELLANEOUS) ×2 IMPLANT
CHLORAPREP W/TINT 10.5 ML (MISCELLANEOUS) ×2 IMPLANT
CLIP APPLIE ROT 10 11.4 M/L (STAPLE) ×1 IMPLANT
CLOTH BEACON ORANGE TIMEOUT ST (SAFETY) ×2 IMPLANT
COVER MAYO STAND STRL (DRAPES) IMPLANT
DECANTER SPIKE VIAL GLASS SM (MISCELLANEOUS) ×2 IMPLANT
DERMABOND ADVANCED (GAUZE/BANDAGES/DRESSINGS) ×1
DERMABOND ADVANCED .7 DNX12 (GAUZE/BANDAGES/DRESSINGS) ×1 IMPLANT
DRAPE C-ARM 42X72 X-RAY (DRAPES) IMPLANT
DRAPE LAPAROSCOPIC ABDOMINAL (DRAPES) ×2 IMPLANT
ELECT REM PT RETURN 9FT ADLT (ELECTROSURGICAL) ×2
ELECTRODE REM PT RTRN 9FT ADLT (ELECTROSURGICAL) ×1 IMPLANT
ENDOLOOP SUT PDS II  0 18 (SUTURE) ×2
ENDOLOOP SUT PDS II 0 18 (SUTURE) ×2 IMPLANT
FILTER SMOKE EVAC LAPAROSHD (FILTER) ×2 IMPLANT
GLOVE BIOGEL PI IND STRL 7.0 (GLOVE) ×1 IMPLANT
GLOVE BIOGEL PI INDICATOR 7.0 (GLOVE) ×1
GLOVE ECLIPSE 8.0 STRL XLNG CF (GLOVE) ×4 IMPLANT
GLOVE EUDERMIC 7 POWDERFREE (GLOVE) ×2 IMPLANT
GLOVE SURG ORTHO 8.0 STRL STRW (GLOVE) IMPLANT
GOWN STRL NON-REIN LRG LVL3 (GOWN DISPOSABLE) ×4 IMPLANT
GOWN STRL REIN XL XLG (GOWN DISPOSABLE) ×6 IMPLANT
HEMOSTAT SURGICEL 4X8 (HEMOSTASIS) ×2 IMPLANT
IV SET EXT 30 76VOL 4 MALE LL (IV SETS) IMPLANT
KIT BASIN OR (CUSTOM PROCEDURE TRAY) ×2 IMPLANT
NS IRRIG 1000ML POUR BTL (IV SOLUTION) ×2 IMPLANT
POUCH SPECIMEN RETRIEVAL 10MM (ENDOMECHANICALS) ×2 IMPLANT
SCISSORS LAP 5X35 DISP (ENDOMECHANICALS) ×2 IMPLANT
SET CHOLANGIOGRAPH MIX (MISCELLANEOUS) ×2 IMPLANT
SET IRRIG TUBING LAPAROSCOPIC (IRRIGATION / IRRIGATOR) ×2 IMPLANT
SLEEVE Z-THREAD 5X100MM (TROCAR) ×2 IMPLANT
SOLUTION ANTI FOG 6CC (MISCELLANEOUS) ×2 IMPLANT
STOPCOCK K 69 2C6206 (IV SETS) IMPLANT
STRIP CLOSURE SKIN 1/4X3 (GAUZE/BANDAGES/DRESSINGS) ×4 IMPLANT
SUT MNCRL AB 4-0 PS2 18 (SUTURE) ×4 IMPLANT
TOWEL OR 17X26 10 PK STRL BLUE (TOWEL DISPOSABLE) ×2 IMPLANT
TRAY LAP CHOLE (CUSTOM PROCEDURE TRAY) ×2 IMPLANT
TROCAR XCEL BLUNT TIP 100MML (ENDOMECHANICALS) ×2 IMPLANT
TROCAR Z-THREAD FIOS 11X100 BL (TROCAR) ×2 IMPLANT
TROCAR Z-THREAD FIOS 5X100MM (TROCAR) ×2 IMPLANT
TUBING INSUFFLATION 10FT LAP (TUBING) ×2 IMPLANT

## 2012-05-26 NOTE — Anesthesia Preprocedure Evaluation (Signed)

## 2012-05-26 NOTE — Preoperative (Signed)
Beta Blockers   Reason not to administer Beta Blockers:Not Applicable 

## 2012-05-26 NOTE — Transfer of Care (Signed)
Immediate Anesthesia Transfer of Care Note  Patient: Mariah Ferrell  Procedure(s) Performed: Procedure(s): LAPAROSCOPIC CHOLECYSTECTOMY WITH INTRAOPERATIVE CHOLANGIOGRAM (N/A)  Patient Location: PACU  Anesthesia Type:General  Level of Consciousness: alert , oriented, sedated and patient cooperative  Airway & Oxygen Therapy: Patient Spontanous Breathing and Patient connected to face mask oxygen  Post-op Assessment: Report given to PACU RN, Post -op Vital signs reviewed and stable and Patient moving all extremities X 4  Post vital signs: stable  Complications: No apparent anesthesia complications

## 2012-05-26 NOTE — Anesthesia Postprocedure Evaluation (Signed)
  Anesthesia Post-op Note  Patient: Mariah Ferrell  Procedure(s) Performed: Procedure(s): ENDOSCOPIC RETROGRADE CHOLANGIOPANCREATOGRAPHY (ERCP) (N/A)  Patient Location: PACU  Anesthesia Type:General  Level of Consciousness: awake  Airway and Oxygen Therapy: Patient Spontanous Breathing  Post-op Pain: mild  Post-op Assessment: Post-op Vital signs reviewed  Post-op Vital Signs: stable  Complications: No apparent anesthesia complications

## 2012-05-26 NOTE — Progress Notes (Signed)
Subjective: Minimal abdominal pain. Ready to proceed with surgery.  Objective: Vital signs in last 24 hours: Temp:  [97.5 F (36.4 C)-98.3 F (36.8 C)] 98.3 F (36.8 C) (05/23 0545) Pulse Rate:  [67-88] 71 (05/23 0545) Resp:  [16-20] 20 (05/23 0545) BP: (106-132)/(66-81) 106/70 mmHg (05/23 0545) SpO2:  [98 %-100 %] 100 % (05/23 0545) Weight change:  Last BM Date: 05/22/12  PE: GEN:  NAD ABD:  Soft  Lab Results: CMP     Component Value Date/Time   NA 139 05/25/2012 0419   K 3.7 05/25/2012 0419   CL 106 05/25/2012 0419   CO2 25 05/25/2012 0419   GLUCOSE 86 05/25/2012 0419   BUN 3* 05/25/2012 0419   CREATININE 0.71 05/25/2012 0419   CREATININE 0.88 05/22/2012 1226   CALCIUM 8.4 05/25/2012 0419   PROT 6.3 05/26/2012 0830   ALBUMIN 3.0* 05/26/2012 0830   AST 87* 05/26/2012 0830   ALT 348* 05/26/2012 0830   ALKPHOS 290* 05/26/2012 0830   BILITOT 1.2 05/26/2012 0830   GFRNONAA >90 05/25/2012 0419   GFRAA >90 05/25/2012 0419   Assessment:  1.  Choledocholithiasis, post-procedure day one from ERCP with biliary sphincterotomy and bile duct stone extractions.  No obvious post-procedural complications. 2.  Elevated LFTs, downtrending. 3.  Cholelithiasis.  Plan: 1.  Proceed with cholecystectomy today. 2.  Will sign-off; please call with questions.   Mariah Ferrell 05/26/2012, 9:42 AM

## 2012-05-26 NOTE — Progress Notes (Signed)
[facility] PROGRESS NOTE  Mariah Ferrell ZOX:[id number] DOB: Jul 28, 1980 DOA: 05/23/2012 PCP: Evette Georges, MD  Brief narrative: Mariah Ferrell is an 32 y.o. female with no significant PMH who was admitted with biliary colic, transaminitis, and cholechodolithiasis.  She has been seen by Dr. Dulce Sellar of gastroenterology with plans to perform an ERCP 05/25/2012 and by Dr. Jamey Ripa of general surgery with plans to undergo laparoscopic cholecystectomy on 05/26/2012.  Assessment/Plan: Principal Problem:   Choledocholithiasis with obstruction and epigastric pain -Admitted and provided with supportive care including IV fluids, antinausea and pain medications. -Seen by gastroenterologist, status post ERCP 05/25/2012. -Elective cholecystectomy scheduled for 05/26/2012. Active Problems:   Migraine headache -Continue Relpax PRN.   UTI (lower urinary tract infection) -Urine culture negative, empiric Rocephin discontinued 05/25/12.    Constipation -Stool softeners as needed.   Transaminitis -Secondary to obstruction of the common bile duct from gallstones.  Code Status: Full. Family Communication: Husband and parents updated at bedside. Disposition Plan: Home when stable.   Medical Consultants:  Dr. Willis Modena, Gastroenterology.  Dr. Gwendlyn Deutscher, General Surgery  Other Consultants:  None.   Anti-infectives:  Rocephin 05/24/2012--->   HPI/Subjective: Mariah Ferrell is without complaints this morning. Denies pain, nausea, vomiting.  Objective: Filed Vitals:   05/25/12 1600 05/25/12 1700 05/25/12 2045 05/26/12 0545  BP:  128/75 132/81 106/70  Pulse: 77 71 73 71  Temp:  97.8 F (36.6 C) 97.9 F (36.6 C) 98.3 F (36.8 C)  TempSrc:   Oral Oral  Resp: 18 18 16 20   Height:      Weight:      SpO2: 100% 100% 100% 100%    Intake/Output Summary (Last 24 hours) at 05/26/12 0940 Last data filed at 05/26/12 0751  Gross per 24 hour  Intake   1640 ml  Output    2000 ml  Net   -360 ml    Exam: Gen:  NAD Cardiovascular:  RRR, No M/R/G Respiratory:  Lungs CTAB Gastrointestinal:  Abdomen soft, tender in the right upper quadrant. Extremities:  No C/E/C  Data Reviewed: Basic Metabolic Panel:  Recent Labs Lab 05/22/12 1226 05/23/12 1920 05/24/12 0350 05/25/12 0419  NA 138 139 135 139  K 4.2 4.0 3.5 3.7  CL 107 106 105 106  CO2 24 21 21 25   GLUCOSE 84 88 103* 86  BUN 7 7 5* 3*  CREATININE 0.88 0.80 0.79 0.71  CALCIUM 9.6 9.1 8.4 8.4   GFR Estimated Creatinine Clearance: 99.5 ml/min (by C-G formula based on Cr of 0.71). Liver Function Tests:  Recent Labs Lab 05/22/12 1226 05/23/12 1920 05/24/12 0350 05/25/12 0419 05/26/12 0830  AST 399* 272* 209* 175* 87*  ALT 917* 716* 570* 500* 348*  ALKPHOS 334* 370* 317* 330* 290*  BILITOT 6.0* 4.0* 4.4* 2.1* 1.2  PROT 6.8 7.2 6.3 6.3 6.3  ALBUMIN 4.2 3.7 3.2* 3.1* 3.0*    Recent Labs Lab 05/23/12 1920 05/26/12 0830  LIPASE 26 73*   CBC:  Recent Labs Lab 05/23/12 1920 05/24/12 0350 05/25/12 0419 05/26/12 0830  WBC 6.8 6.5 4.7 4.9  NEUTROABS 4.1  --   --   --   HGB 12.2 11.1* 11.3* 11.1*  HCT 36.8 34.5* 35.8* 33.5*  MCV 80.3 81.2 84.2 82.7  PLT 264 226 252 255   Microbiology Recent Results (from the past 240 hour(s))  URINE CULTURE     Status: None   Collection Time    05/23/12  8:17 PM  Result Value Range Status   Specimen Description URINE, CLEAN CATCH   Final   Special Requests NONE   Final   Culture  Setup Time 05/24/2012 01:31   Final   Colony Count NO GROWTH   Final   Culture NO GROWTH   Final   Report Status 05/24/2012 FINAL   Final  SURGICAL PCR SCREEN     Status: None   Collection Time    05/25/12  8:10 AM      Result Value Range Status   MRSA, PCR NEGATIVE  NEGATIVE Final   Staphylococcus aureus NEGATIVE  NEGATIVE Final   Comment:            The Xpert SA Assay (FDA     approved for NASAL specimens     in patients over 44 years of age),     is  one component of     a comprehensive surveillance     program.  Test performance has     been validated by [employer] for patients greater     than or equal to 32 year old.     It is not intended     to diagnose infection nor to     guide or monitor treatment.     Procedures and Diagnostic Studies: US Abdomen Complete  05/23/2012   *RADIOLOGY REPORT*  Clinical Data:  Right upper quadrant pain.  Elevated liver function tests.  ABDOMINAL ULTRASOUND COMPLETE  Comparison:  None  Findings:  Gallbladder:  Multiple small less than 1 cm gallstones are seen. There is no evidence of gallbladder wall thickening or pericholecystic fluid.  Common Bile Duct:  Diffuse dilatation of the intrahepatic bile ducts is seen as well as the common bile duct.  The common bile duct measures up to 1.7 cm and at least one stone is seen within the distal common bile duct which measures approximately 7 mm.  Liver: No focal mass lesion identified.  Within normal limits in parenchymal echogenicity.  IVC:  Appears normal.  Pancreas:  No abnormality identified.  Spleen:  Within normal limits in size and echotexture.  Right kidney:  Normal in size and parenchymal echogenicity.  No evidence of mass or hydronephrosis.  Left kidney:  Normal in size and parenchymal echogenicity.  No evidence of mass or hydronephrosis.  Abdominal Aorta:  No aneurysm identified.  IMPRESSION:  1.  Diffuse biliary ductal dilatation, with choledocholithiasis. 2.  Cholelithiasis, without other sonographic signs of acute cholecystitis.   Original Report Authenticated By: Myles Rosenthal, M.D.   Dg Abd 2 Views  05/22/2012   *RADIOLOGY REPORT*  Clinical Data: Abdominal pain  ABDOMEN - 2 VIEW  Comparison: None.  Findings: Upright film shows no evidence for intraperitoneal free air.  Supine film shows no gaseous bowel dilatation to suggest obstruction.  Prominent stool volume noted.  Visualized bony structures are normal.  IMPRESSION: Prominent stool volume could  be compatible with clinical constipation.   Original Report Authenticated By: Kennith Center, M.D.    Scheduled Meds: . scopolamine  1 patch Transdermal Once  . senna-docusate  1 tablet Oral BID  . topiramate  75 mg Oral QHS   Continuous Infusions: . sodium chloride 75 mL/hr at 05/25/12 0317  . lactated ringers 50 mL/hr at 05/26/12 0340    Time spent: 25 minutes.   LOS: 3 days   Tarick Parenteau  [facility] Pager [phone].  If 8PM-8AM, please contact night-coverage at [email or web address], password [facility] 05/26/2012, 9:40 AM

## 2012-05-26 NOTE — Anesthesia Postprocedure Evaluation (Signed)
  Anesthesia Post-op Note  Patient: Mariah Ferrell  Procedure(s) Performed: Procedure(s) (LRB): LAPAROSCOPIC CHOLECYSTECTOMY WITH INTRAOPERATIVE CHOLANGIOGRAM (N/A)  Patient Location: PACU  Anesthesia Type: General  Level of Consciousness: awake and alert   Airway and Oxygen Therapy: Patient Spontanous Breathing  Post-op Pain: mild  Post-op Assessment: Post-op Vital signs reviewed, Patient's Cardiovascular Status Stable, Respiratory Function Stable, Patent Airway and No signs of Nausea or vomiting  Last Vitals:  Filed Vitals:   05/26/12 1400  BP: 138/76  Pulse: 72  Temp: 36.9 C  Resp: 14    Post-op Vital Signs: stable   Complications: No apparent anesthesia complications

## 2012-05-26 NOTE — Progress Notes (Signed)
Choledocholithiasis with obstruction  Assessment: Doing well s/p ercp and stone retreival  Plan: Await am labs but anticipate proceeding today to cholecystectomy and IOC. Discussed with patient and questions answered   Subjective: Feels OK this am, had some pain when awoke after ERCP, but improved this am. Anxious to move on to surgery  Objective: Vital signs in last 24 hours: Temp:  [97.5 F (36.4 C)-98.3 F (36.8 C)] 98.3 F (36.8 C) (05/23 0545) Pulse Rate:  [67-88] 71 (05/23 0545) Resp:  [16-20] 20 (05/23 0545) BP: (106-132)/(66-81) 106/70 mmHg (05/23 0545) SpO2:  [98 %-100 %] 100 % (05/23 0545) Last BM Date: 05/22/12  Intake/Output from previous day: 05/22 0701 - 05/23 0700 In: 1640 [P.O.:240; I.V.:1400] Out: 2600 [Urine:2600]  General appearance: alert, cooperative and no distress GI: soft, non-tender; bowel sounds normal; no masses,  no organomegaly  Lab Results:  Results for orders placed during the hospital encounter of 05/23/12 (from the past 24 hour(s))  SURGICAL PCR SCREEN     Status: None   Collection Time    05/25/12  8:10 AM      Result Value Range   MRSA, PCR NEGATIVE  NEGATIVE   Staphylococcus aureus NEGATIVE  NEGATIVE     Studies/Results Radiology     MEDS, Scheduled . scopolamine  1 patch Transdermal Once  . senna-docusate  1 tablet Oral BID  . topiramate  75 mg Oral QHS       LOS: 3 days    Currie Paris, MD, [facility] Surgery, [place] [phone]   05/26/2012 7:33 AM

## 2012-05-26 NOTE — Op Note (Signed)
SALLI BODIN 02/24/1980 [id number] 05/23/2012  Preoperative diagnosis: chronic calculus cholecystitis, status post ERCP and stone retrieval for choledocholithiasis  Postoperative diagnosis: the same  Procedure: laparoscopic cholecystectomy with intraoperative cholangiogram  Surgeon: Currie Paris, MD, FACS  Assistant surgeon: Dr. Viviann Spare gross   Anesthesia: General  Clinical History and Indications: This patient was admitted and found to have cholelithiasis and choledocholithiasis.she underwent ERCP yesterday with stone retrieval. She is brought to the operating room today for cholecystectomy.  Description of procedure: The patient was seen in the preoperative area. I reviewed the plans for the procedure with her as well as the risks and complications. She had no further questions and wished to proceed.  The patient was taken to the operating room. After satisfactory general endotracheal anesthesia had been obtained the abdomen was prepped and draped. A time out was done.  0.25% plain Marcaine was used at all incisions. I made an umbilical incision, identified the fascia and opened that, and entered the peritoneal cavity under direct vision. A 0 Vicryl pursestring suture was placed and the Hasson cannula was introduced under direct vision and secured with the pursestring. The abdomen was inflated to 15 cm.  The camera was placed and there were no gross abnormalities. The patient was then placed in reverse Trendelenburg and tilted to the left. A 10/11 trocar was placed in the epigastrium and two 5 mm trochars placed laterally all under direct vision.  The gallbladder was markedly distended. I opened the peritoneum in the area of the cystic duct was able to identify the cystic artery which was closely adherent to the cystic duct but I was able to dissected off. I clipped and divided. I then had a long window behind the cystic duct and saw the cystic duct lymph node. I can also see  the junction with the common duct. I put a clip on the cystic duct at the junction with the gallbladder and opened. A few stones were milked out.  An intraoperative cholangiogram was then performed. A Cook catheter was introduced percutaneously and placed in the cystic duct. The cholangiogram showed good filling of the common duct and hepatic radicals, but there also appeared to be several stones remaining in the common bile duct. She had stones in the cystic duct these may have entered the common duct after the ERCP yesterday. The catheter was removed, and the cystic duct was divided. It was then ligated with 2 PDS Endoloops.  The gallbladder was then removed from below to above the coagulation current of the cautery. It was then placed in a bag to be retrieved later.  The abdomen was irrigated and a check for hemostasis along the bed of the gallbladder made. Once everything appeared to be dry we were able to move the camera to the epigastric port and removed the gallbladder through the umbilical port.  The abdomen was reinsufflated and a final check for hemostasis made. There is no evidence of bleeding or bile leakage. I left some Surgicel along the bed of the gallbladder where there had been some bleeding that was controlled with cautery.The lateral ports were removed under direct vision and there was no bleeding. The umbilical site was closed with a pursestring, watching with the camera in the epigastric port. The abdomen was then deflated through the epigastric port and that was removed. Skin was closed with 4-0 Monocryl subcuticular and Dermabond.  The patient tolerated the procedure well. There were no operative complications. EBL was minimal. All counts were correct.  Currie Paris, MD, FACS 05/26/2012 12:38 PM

## 2012-05-27 DIAGNOSIS — D62 Acute posthemorrhagic anemia: Secondary | ICD-10-CM | POA: Diagnosis not present

## 2012-05-27 LAB — COMPREHENSIVE METABOLIC PANEL
ALT: 272 U/L — ABNORMAL HIGH (ref 0–35)
AST: 84 U/L — ABNORMAL HIGH (ref 0–37)
Albumin: 2.8 g/dL — ABNORMAL LOW (ref 3.5–5.2)
Alkaline Phosphatase: 243 U/L — ABNORMAL HIGH (ref 39–117)
GFR calc Af Amer: >90 mL/min (ref >90)
Glucose, Bld: 120 mg/dL — ABNORMAL HIGH (ref 70–99)
Potassium: 3.5 mEq/L (ref 3.5–5.1)
Sodium: 137 mEq/L (ref 135–145)
Total Protein: 5.7 g/dL — ABNORMAL LOW (ref 6.0–8.3)

## 2012-05-27 LAB — CBC
HCT: 31.3 % — ABNORMAL LOW (ref 36.0–46.0)
Hemoglobin: 9.9 g/dL — ABNORMAL LOW (ref 12.0–15.0)
MCH: 26.4 pg (ref 26.0–34.0)
MCHC: 31.6 g/dL (ref 30.0–36.0)
MCV: 83.5 fL (ref 78.0–100.0)
RDW: 14 % (ref 11.5–15.5)

## 2012-05-27 MED ORDER — HYDROCODONE-ACETAMINOPHEN 10-500 MG PO TABS: 1.0000 | ORAL_TABLET | Freq: Four times a day (QID) | ORAL | Status: DC | PRN

## 2012-05-27 MED ORDER — HYDROCODONE-ACETAMINOPHEN 10-325 MG PO TABS
1.0000 | ORAL_TABLET | Freq: Four times a day (QID) | ORAL | Status: DC | PRN
  Administered 2012-05-27: 1 via ORAL
  Filled 2012-05-27: qty 1

## 2012-05-27 MED ORDER — PROMETHAZINE HCL 25 MG PO TABS: 25.0000 mg | ORAL_TABLET | Freq: Four times a day (QID) | ORAL | Status: DC | PRN

## 2012-05-27 MED ORDER — POLYETHYLENE GLYCOL 3350 17 G PO PACK
17.0000 g | PACK | Freq: Every day | ORAL | Status: DC
  Administered 2012-05-27: 17 g via ORAL
  Filled 2012-05-27 (×2): qty 1

## 2012-05-27 MED ORDER — PROMETHAZINE HCL 25 MG PO TABS
25.0000 mg | ORAL_TABLET | Freq: Four times a day (QID) | ORAL | Status: DC | PRN
  Administered 2012-05-27: 25 mg via ORAL
  Filled 2012-05-27: qty 1

## 2012-05-27 MED ORDER — HYDROMORPHONE HCL 2 MG PO TABS
1.0000 mg | ORAL_TABLET | Freq: Once | ORAL | Status: AC
  Administered 2012-05-27: 1 mg via ORAL
  Filled 2012-05-27: qty 1

## 2012-05-27 NOTE — Progress Notes (Signed)
Patient ID: Mariah Ferrell, female   DOB: 1980/01/28, 32 y.o.   MRN: [id number]  General Surgery - [facility] - Progress Note  POD# 1  Subjective: Patient comfortable.  Tolerating diet.  Wanting to go home.  Objective: Vital signs in last 24 hours: Temp:  [98.1 F (36.7 C)-98.5 F (36.9 C)] 98.2 F (36.8 C) (05/24 0500) Pulse Rate:  [64-90] 72 (05/24 0500) Resp:  [9-18] 16 (05/24 0500) BP: (109-138)/(64-82) 110/75 mmHg (05/24 0500) SpO2:  [99 %-100 %] 100 % (05/24 0500) Last BM Date: 05/22/12  Intake/Output from previous day: 05/23 0701 - 05/24 0700 In: 1750 [I.V.:1750] Out: 1700 [Urine:1000; Blood:700]  Exam: HEENT - clear, not icteric Neck - soft Chest - clear bilaterally Cor - RRR, no murmur Abd - soft, obese; wounds clear and dry; mild tenderness RUQ Ext - no significant edema Neuro - grossly intact, no focal deficits  Lab Results:   Recent Labs  05/26/12 0830 05/27/12 0602  WBC 4.9 8.0  HGB 11.1* 9.9*  HCT 33.5* 31.3*  PLT 255 230     Recent Labs  05/25/12 0419 05/27/12 0602  NA 139 137  K 3.7 3.5  CL 106 104  CO2 25 25  GLUCOSE 86 120*  BUN 3* 5*  CREATININE 0.71 0.72  CALCIUM 8.4 8.5    Studies/Results: Dg Cholangiogram Operative  05/26/2012   *RADIOLOGY REPORT*  Clinical Data: Cholelithiasis  INTRAOPERATIVE CHOLANGIOGRAM  Technique:  Multiple fluoroscopic spot radiographs were obtained during intraoperative cholangiogram and are submitted for interpretation post-operatively.  Comparison: ERCP 05/25/2012  Findings: Multiple small filling defects in the common bile duct. These appear occlusive near the level of the ampulla with no significant flow into the duodenum.  IMPRESSION:  1.  Occlusive choledocholithiasis   Original Report Authenticated By: D. Andria Rhein, MD   Dg Ercp Biliary & Pancreatic Ducts  05/25/2012   *RADIOLOGY REPORT*  Clinical Data: Choledocholithiasis.  ERCP  Comparison:  Abdominal ultrasound 05/23/2012.   Technique:  Multiple spot images were obtained with the fluoroscopic device and submitted for interpretation post- procedure.  ERCP was performed by Dr. Dulce Sellar.  Findings: Initial images demonstrate multiple filling defects within the common bile duct.  At least three of the images demonstrate a balloon occlusion catheter sweeping the duct.  The radiologic technologist documented 12 minutes 1 second of fluoroscopy time.  IMPRESSION: Choledocholithiasis with stone removal by Dr. Dulce Sellar.  These images were submitted for radiologic interpretation only. Please see the procedural report for the amount of contrast and the fluoroscopy time utilized.   Original Report Authenticated By: Hulan Saas, M.D.    Assessment / Plan: 1.  Status post lap chole  Probable retained CBD stones with elevated LFT's  Discussed with patient  Discussed with Dr. Bosie Clos who will see patient later today - patient can go home and follow up with Dr. Dulce Sellar in clinic this week with lab work to be repeated  Will see Dr. Jamey Ripa in [facility] office 2-3 weeks in follow up  May be discharged home today from surgical standpoint if OK with GI service  Velora Heckler, MD, [facility] Office: [phone]  05/27/2012

## 2012-05-27 NOTE — Progress Notes (Signed)
Patient ID: Mariah Ferrell, female   DOB: [birth year], 32 y.o.   MRN: [id number]  Patient sitting in chair. Feels good. Wants to go home. LFTs trending down. Reports intermittent abdominal pain that she attributes to her recent lap chole.  Tolerating regular diet.  Stable to go home as I discussed this morning with Dr. Gerrit Friends. Agree with lab recheck next week. Nurse aware.

## 2012-05-27 NOTE — Discharge Summary (Addendum)
Physician Discharge Summary  Mariah Ferrell:[id number] DOB: [birth year] DOA: 05/23/2012  PCP: Evette Georges, MD  Admit date: 05/23/2012 Discharge date: 05/27/2012  Recommendations for Outpatient Follow-up:  1. F/U with Dr. Jamey Ripa in 2-3 weeks. 2. F/U with Dr. Dulce Sellar in 1 week for repeat CMET and CBC.  Discharge Diagnoses:  Principal Problem:    Choledocholithiasis with obstruction and epigastric pain status post ERCP/sphincterotomy and laparoscopic cholecystectomy Active Problems:    MIGRAINE HEADACHE    UTI (lower urinary tract infection)    Constipation    Transaminitis    Postoperative anemia due to acute blood loss  Discharge Condition: Improved.  Diet recommendation: Low-sodium, heart healthy.  History of present illness:  Mariah Ferrell is an 32 y.o. female with no significant PMH who was admitted to the hospital on 05/23/2012 with biliary colic, transaminitis, and cholechodolithiasis.   Hospital Course by problem:  Principal Problem:  Choledocholithiasis with obstruction and epigastric pain  -Admitted and provided with supportive care including IV fluids, antinausea and pain medications.  -Seen by gastroenterologist, status post ERCP with sphincterotomy 05/25/2012.  -Status post laparoscopic cholecystectomy 05/26/2012. No postsurgical complications. Active Problems:  Postoperative anemia secondary to blood loss -2 g drop in hemoglobin noted postoperatively. Recommend recheck of CBC on next M.D. visit. Migraine headache  -Continue Relpax PRN.  UTI (lower urinary tract infection)  -Urine culture negative, empiric Rocephin discontinued 05/25/12.  Constipation  -Stool softeners as needed.  Transaminitis / hyperbilirubinemia  -Secondary to obstruction of the common bile duct from gallstones. LFTs trending down over time.   Procedures:  ERCP with sphincterotomy 05/25/2012  Laparoscopic cholecystectomy 05/26/2012  Consultations: Dr. Willis Modena,  Gastroenterology.  Dr. Cyndia Bent, General Surgery  Discharge Exam: Filed Vitals:   05/27/12 0500  BP: 110/75  Pulse: 72  Temp: 98.2 F (36.8 C)  Resp: 16   Filed Vitals:   05/26/12 1415 05/26/12 1438 05/26/12 2100 05/27/12 0500  BP: 122/76 131/82 109/64 110/75  Pulse: 73 90 72 72  Temp: 98.5 F (36.9 C) 98.4 F (36.9 C) 98.5 F (36.9 C) 98.2 F (36.8 C)  TempSrc:   Oral Oral  Resp: 12 16 18 16   Height:      Weight:      SpO2: 100% 100% 99% 100%    Gen:  NAD Cardiovascular:  RRR, No M/R/G Respiratory: Lungs CTAB Gastrointestinal: Abdomen slightly tender Extremities: No C/E/C   Discharge Instructions  Discharge Orders   Future Orders Complete By Expires     Call MD for:  persistant nausea and vomiting  As directed     Call MD for:  redness, tenderness, or signs of infection (pain, swelling, redness, odor or green/yellow discharge around incision site)  As directed     Call MD for:  severe uncontrolled pain  As directed     Call MD for:  temperature >100.4  As directed     Diet - low sodium heart healthy  As directed     Discharge instructions  As directed     Comments:      May return to work when cleared by Careers adviser.    Increase activity slowly  As directed         Medication List    TAKE these medications       baclofen 10 MG tablet  Commonly known as:  LIORESAL  Take 10 mg by mouth 3 (three) times daily as needed (for headaches).     eletriptan 20 MG tablet  Commonly known  as:  RELPAX  One tablet by mouth at onset of headache. May repeat in 2 hours if headache persists or recurs. may repeat in 2 hours if necessary     HYDROcodone-acetaminophen 10-500 MG per tablet  Commonly known as:  LORTAB  Take 1 tablet by mouth every 6 (six) hours as needed for pain.     promethazine 25 MG tablet  Commonly known as:  PHENERGAN  Take 1 tablet (25 mg total) by mouth every 6 (six) hours as needed for nausea.     topiramate 25 MG tablet  Commonly known  as:  TOPAMAX  Take 75 mg by mouth at bedtime.          The results of significant diagnostics from this hospitalization (including imaging, microbiology, ancillary and laboratory) are listed below for reference.    Significant Diagnostic Studies: Dg Cholangiogram Operative  05/26/2012   *RADIOLOGY REPORT*  Clinical Data: Cholelithiasis  INTRAOPERATIVE CHOLANGIOGRAM  Technique:  Multiple fluoroscopic spot radiographs were obtained during intraoperative cholangiogram and are submitted for interpretation post-operatively.  Comparison: ERCP 05/25/2012  Findings: Multiple small filling defects in the common bile duct. These appear occlusive near the level of the ampulla with no significant flow into the duodenum.  IMPRESSION:  1.  Occlusive choledocholithiasis   Original Report Authenticated By: D. Andria Rhein, MD   US Abdomen Complete  05/23/2012   *RADIOLOGY REPORT*  Clinical Data:  Right upper quadrant pain.  Elevated liver function tests.  ABDOMINAL ULTRASOUND COMPLETE  Comparison:  None  Findings:  Gallbladder:  Multiple small less than 1 cm gallstones are seen. There is no evidence of gallbladder wall thickening or pericholecystic fluid.  Common Bile Duct:  Diffuse dilatation of the intrahepatic bile ducts is seen as well as the common bile duct.  The common bile duct measures up to 1.7 cm and at least one stone is seen within the distal common bile duct which measures approximately 7 mm.  Liver: No focal mass lesion identified.  Within normal limits in parenchymal echogenicity.  IVC:  Appears normal.  Pancreas:  No abnormality identified.  Spleen:  Within normal limits in size and echotexture.  Right kidney:  Normal in size and parenchymal echogenicity.  No evidence of mass or hydronephrosis.  Left kidney:  Normal in size and parenchymal echogenicity.  No evidence of mass or hydronephrosis.  Abdominal Aorta:  No aneurysm identified.  IMPRESSION:  1.  Diffuse biliary ductal dilatation, with  choledocholithiasis. 2.  Cholelithiasis, without other sonographic signs of acute cholecystitis.   Original Report Authenticated By: Myles Rosenthal, M.D.   Dg Ercp Biliary & Pancreatic Ducts  05/25/2012   *RADIOLOGY REPORT*  Clinical Data: Choledocholithiasis.  ERCP  Comparison:  Abdominal ultrasound 05/23/2012.  Technique:  Multiple spot images were obtained with the fluoroscopic device and submitted for interpretation post- procedure.  ERCP was performed by Dr. Dulce Sellar.  Findings: Initial images demonstrate multiple filling defects within the common bile duct.  At least three of the images demonstrate a balloon occlusion catheter sweeping the duct.  The radiologic technologist documented 12 minutes 1 second of fluoroscopy time.  IMPRESSION: Choledocholithiasis with stone removal by Dr. Dulce Sellar.  These images were submitted for radiologic interpretation only. Please see the procedural report for the amount of contrast and the fluoroscopy time utilized.   Original Report Authenticated By: Hulan Saas, M.D.   Dg Abd 2 Views  05/22/2012   *RADIOLOGY REPORT*  Clinical Data: Abdominal pain  ABDOMEN - 2 VIEW  Comparison: None.  Findings: Upright film shows no evidence for intraperitoneal free air.  Supine film shows no gaseous bowel dilatation to suggest obstruction.  Prominent stool volume noted.  Visualized bony structures are normal.  IMPRESSION: Prominent stool volume could be compatible with clinical constipation.   Original Report Authenticated By: Kennith Center, M.D.    Labs:  Basic Metabolic Panel:  Recent Labs Lab 05/22/12 1226 05/23/12 1920 05/24/12 0350 05/25/12 0419 05/27/12 0602  NA 138 139 135 139 137  K 4.2 4.0 3.5 3.7 3.5  CL 107 106 105 106 104  CO2 24 21 21 25 25   GLUCOSE 84 88 103* 86 120*  BUN 7 7 5* 3* 5*  CREATININE 0.88 0.80 0.79 0.71 0.72  CALCIUM 9.6 9.1 8.4 8.4 8.5   GFR Estimated Creatinine Clearance: 99.5 ml/min (by C-G formula based on Cr of 0.72). Liver Function  Tests:  Recent Labs Lab 05/23/12 1920 05/24/12 0350 05/25/12 0419 05/26/12 0830 05/27/12 0602  AST 272* 209* 175* 87* 84*  ALT 716* 570* 500* 348* 272*  ALKPHOS 370* 317* 330* 290* 243*  BILITOT 4.0* 4.4* 2.1* 1.2 0.8  PROT 7.2 6.3 6.3 6.3 5.7*  ALBUMIN 3.7 3.2* 3.1* 3.0* 2.8*    Recent Labs Lab 05/23/12 1920 05/26/12 0830  LIPASE 26 73*   CBC:  Recent Labs Lab 05/23/12 1920 05/24/12 0350 05/25/12 0419 05/26/12 0830 05/27/12 0602  WBC 6.8 6.5 4.7 4.9 8.0  NEUTROABS 4.1  --   --   --   --   HGB 12.2 11.1* 11.3* 11.1* 9.9*  HCT 36.8 34.5* 35.8* 33.5* 31.3*  MCV 80.3 81.2 84.2 82.7 83.5  PLT 264 226 252 255 230   Microbiology Recent Results (from the past 240 hour(s))  URINE CULTURE     Status: None   Collection Time    05/23/12  8:17 PM      Result Value Range Status   Specimen Description URINE, CLEAN CATCH   Final   Special Requests NONE   Final   Culture  Setup Time 05/24/2012 01:31   Final   Colony Count NO GROWTH   Final   Culture NO GROWTH   Final   Report Status 05/24/2012 FINAL   Final  SURGICAL PCR SCREEN     Status: None   Collection Time    05/25/12  8:10 AM      Result Value Range Status   MRSA, PCR NEGATIVE  NEGATIVE Final   Staphylococcus aureus NEGATIVE  NEGATIVE Final   Comment:            The Xpert SA Assay (FDA     approved for NASAL specimens     in patients over 41 years of age),     is one component of     a comprehensive surveillance     program.  Test performance has     been validated by [employer] for patients greater     than or equal to 86 year old.     It is not intended     to diagnose infection nor to     guide or monitor treatment.    Time coordinating discharge: 35 minutes.  Signed:  RAMA,CHRISTINA  Pager [phone] [facility] 05/27/2012, 11:23 AM

## 2012-05-30 ENCOUNTER — Encounter (HOSPITAL_COMMUNITY): Payer: Self-pay | Admitting: Surgery

## 2012-06-09 ENCOUNTER — Encounter (INDEPENDENT_AMBULATORY_CARE_PROVIDER_SITE_OTHER): Payer: Self-pay | Admitting: Surgery

## 2012-06-09 ENCOUNTER — Ambulatory Visit (INDEPENDENT_AMBULATORY_CARE_PROVIDER_SITE_OTHER): Payer: BC Managed Care – PPO | Admitting: Surgery

## 2012-06-09 VITALS — BP 120/82 | HR 72 | Temp 97.4°F | Resp 18 | Ht 63.0 in | Wt 183.0 lb

## 2012-06-09 DIAGNOSIS — Z09 Encounter for follow-up examination after completed treatment for conditions other than malignant neoplasm: Secondary | ICD-10-CM

## 2012-06-09 DIAGNOSIS — K8051 Calculus of bile duct without cholangitis or cholecystitis with obstruction: Secondary | ICD-10-CM

## 2012-06-09 MED ORDER — PROMETHAZINE HCL 25 MG PO TABS: 25.0000 mg | ORAL_TABLET | Freq: Four times a day (QID) | ORAL | Status: DC | PRN

## 2012-06-09 MED ORDER — HYDROCODONE-ACETAMINOPHEN 10-500 MG PO TABS
1.0000 | ORAL_TABLET | Freq: Four times a day (QID) | ORAL | Status: DC | PRN
Start: 2012-06-09 — End: 2014-08-07

## 2012-06-09 NOTE — Progress Notes (Signed)
NAME: Mariah Ferrell       DOB: 1980/05/27           DATE: 06/09/2012       XBM:[id number]   CC:  Chief Complaint  Patient presents with  . Routine Post Op     Impression:  The patient appears to be doing well, with improvement in her symptoms. She has a followup appointment with Dr Dulce Sellar in a week with more labs to be done prior to that visit  Plan:  She may resume full activity and regular diet. She  will followup with [place] on a p.r.n. basis. I did tell her that she may still have some foods that cause indigestion and ask her to call us if there are any questions, problems or concerns. Refilled Hydrocodone and phenergan HPI:  This patient underwent a laparoscopic cholecystectomy with operative cholangiogram on 5/23. She is in for her first postoperative visit. She notes that her incisional pain has resolved. Her preoperative symptoms have improved. She is not having problems with nausea, vomiting, diarrhea, fevers, chills, or urinary symptoms. She is tolerating diet. She feels that she is progressing well and nearly back to normal.She is still having some back pain and reports her LFT's are still up. PE:  VS: BP 120/82  Pulse 72  Temp(Src) 97.4 F (36.3 C) (Oral)  Resp 18  Ht 5\' 3"  (1.6 m)  Wt 183 lb (83.008 kg)  BMI 32.43 kg/m2  LMP 05/25/2012  General: The patient is alert and appears comfortable, NAD.  Abdomen: Soft and benign. The incisions are healing nicely. There are no apparent problems.  Data reviewed: IOC: Clinical Data: Cholelithiasis  INTRAOPERATIVE CHOLANGIOGRAM  Technique: Multiple fluoroscopic spot radiographs were obtained  during intraoperative cholangiogram and are submitted for  interpretation post-operatively.  Comparison: ERCP 05/25/2012  Findings: Multiple small filling defects in the common bile duct.  These appear occlusive near the level of the ampulla with no  significant flow into the duodenum.  IMPRESSION:  1. Occlusive choledocholithiasis   Original Report Authenticated By: D. Andria Rhein, MD  Pathology: Diagnosis Gallbladder CHRONIC CHOLECYSTITIS AND CHOLELITHIASIS. Abigail Miyamoto MD Pathologist, Electronic Signature

## 2012-06-09 NOTE — Patient Instructions (Signed)
We will see you again on an as needed basis. Please call the office at [phone] if you have any questions or concerns. Thank you for allowing us to take care of you.  

## 2013-02-14 ENCOUNTER — Ambulatory Visit (INDEPENDENT_AMBULATORY_CARE_PROVIDER_SITE_OTHER): Payer: 59 | Admitting: Family Medicine

## 2013-02-14 VITALS — BP 130/90 | HR 92 | Temp 98.8°F | Resp 18 | Ht 63.0 in | Wt 200.2 lb

## 2013-02-14 DIAGNOSIS — M222X9 Patellofemoral disorders, unspecified knee: Secondary | ICD-10-CM

## 2013-02-14 DIAGNOSIS — M25569 Pain in unspecified knee: Secondary | ICD-10-CM

## 2013-02-14 MED ORDER — NAPROXEN 500 MG PO TABS: 500.0000 mg | ORAL_TABLET | Freq: Two times a day (BID) | ORAL | Status: DC

## 2013-02-14 NOTE — Progress Notes (Addendum)
   Subjective:    Patient ID: Mariah Ferrell, female    DOB: 12/20/1980, 33 y.o.   MRN: [id number]  HPI Patient presents for bilateral knee pain, right > left. Started about 2-3 weeks ago without injury or trigger. Has been doing increased exercise programs for the last 2-2.5 months. Worse with squats and stairs. Pain is diffuse around knee. No swelling. No locking, catching, giving out but does get popping at times. Has tried ice and prn NSAIDs. 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   Review of Systems  All other systems reviewed and are negative.      Objective:   Physical Exam  Constitutional: She appears well-developed and well-nourished. No distress.  HENT:  Head: Normocephalic and atraumatic.  Eyes: No scleral icterus.  Cardiovascular: Normal rate.   Pulmonary/Chest: Effort normal. No respiratory distress.  Musculoskeletal:       Right knee: She exhibits no swelling, no effusion, no ecchymosis, no deformity, no erythema, no LCL laxity, normal patellar mobility, no bony tenderness, normal meniscus and no MCL laxity. Tenderness found. No medial joint line, no lateral joint line, no MCL, no LCL and no patellar tendon tenderness noted.       Left knee: Normal.  Skin: Skin is warm and dry. She is not diaphoretic.  Psychiatric: She has a normal mood and affect. Her behavior is normal.      Assessment & Plan:  #1. Patellofemoral pain - Exam reassuring - No xrays needed today - Conservative care: NSAID, ice, compression, quad and VMO strengthening HEP - F/u 1 month if not at least 50% better, consider xrays, formal PT, at that time.  This patient was evaluated by a resident in training under my supervision.  I was personally available at all times while this patient was under evaluation and treatment.  Jeffery S Anderson, M.D.  

## 2013-02-14 NOTE — Patient Instructions (Addendum)
Thank you for coming in today  1. Consider compression sleeve 2. Ice for 15 min 2x per day, especially after exercise 3. Naproxen 2x per day with food for 2 weeks, then as needed 4. Home exercise plan! - Stationary bicycle fast speed, low resistance for 30 min 2-3 times per week - Straight leg raise 2 x 20 each with toes straight and toes turned out - Short arc quad with ball squeeze 3 x 15 - Wall squat with ball squeeze work up to 3 x 60 sec  Try this for about a month. Return if not improving.   I am here only Weds nights from 5:30-close. Dr. Neomia DearVoss  Patellofemoral Pain Your exam shows your knee pain is probably due to a problem with the knee cap, the patella. This problem is also called patellofemoral pain, runner's knee, or chondromalacia. Most of the time, this problem is due to overuse of the knee joint. Repeated bending and straightening can irritate the underside of the knee cap. When this happens, activities such as running, walking, climbing, biking or jumping usually produce pain. Pain may also occur after prolonged sitting. Other patellofemoral symptoms can include joint stiffness, swelling, and a snapping or grinding sensation with movement. Rest and rehabilitation are usually successful in treating this problem. Surgery is rarely needed. Treatment includes correcting any mechanical factors that could hurt the normal working of the knee. This could be weak thigh muscles or foot problems. Avoid repetitive activities of the knee until the pain and other symptoms improve. Apply ice packs over the knee for 20 to 30 minutes every 2 to 4 hours to reduce pain and swelling. Only take over-the-counter or prescription medicines for pain, discomfort, or fever as directed by your caregiver. Knee braces or neoprene sleeves may help reduce irritation. Rehabilitation exercises to strengthen the quad muscle are often prescribed when your symptoms are better. Call your caregiver for a follow-up exam to  evaluate your response to treatment. Document Released: 01/29/2004 Document Revised: 03/15/2011 Document Reviewed: 12/21/2004 [facility] Patient Information 2014 [place], [place].

## 2014-01-17 ENCOUNTER — Encounter (HOSPITAL_COMMUNITY): Payer: Self-pay | Admitting: Gastroenterology

## 2014-02-10 ENCOUNTER — Ambulatory Visit (INDEPENDENT_AMBULATORY_CARE_PROVIDER_SITE_OTHER): Payer: 59 | Admitting: Internal Medicine

## 2014-02-10 VITALS — BP 132/84 | HR 86 | Temp 98.1°F | Resp 20 | Ht 63.75 in | Wt 206.1 lb

## 2014-02-10 DIAGNOSIS — G43001 Migraine without aura, not intractable, with status migrainosus: Secondary | ICD-10-CM

## 2014-02-10 MED ORDER — ELETRIPTAN HYDROBROMIDE 20 MG PO TABS: 20.0000 mg | ORAL_TABLET | ORAL | Status: DC | PRN

## 2014-02-10 MED ORDER — BUTALBITAL-APAP-CAFFEINE 50-325-40 MG PO TABS: 1.0000 | ORAL_TABLET | Freq: Four times a day (QID) | ORAL | Status: DC | PRN

## 2014-02-10 MED ORDER — PROMETHAZINE HCL 25 MG PO TABS: 25.0000 mg | ORAL_TABLET | Freq: Four times a day (QID) | ORAL | Status: DC | PRN

## 2014-02-10 MED ORDER — ONDANSETRON 4 MG PO TBDP
8.0000 mg | ORAL_TABLET | Freq: Once | ORAL | Status: AC
  Administered 2014-02-10: 8 mg via ORAL

## 2014-02-10 MED ORDER — KETOROLAC TROMETHAMINE 60 MG/2ML IM SOLN
60.0000 mg | Freq: Once | INTRAMUSCULAR | Status: AC
  Administered 2014-02-10: 60 mg via INTRAMUSCULAR

## 2014-02-10 NOTE — Progress Notes (Addendum)
Subjective:    Patient ID: Mariah Ferrell, female    DOB: Oct 14, 1980, 34 y.o.   MRN: [id number]  Chief Complaint  Patient presents with  . Migraine    started yesterday morning    HPI HPI Comments: Mariah Ferrell is a 33 y.o. female with a history of migraines who presents to [facility] complaining of frontal HA which began yesterday morning in class and worsened through the day. She would wake periodically through the night. The HA radiates to the back of her head. No blurred vision. No dizziness. She has nausea with 2x vomiting. She does not usually vomit with her migraines. She has tried excedrin and ibuprofen for no relief and this often will work. Has congestion but she took muscinex D yesterday and today which has provided relief. PT has a Hx of migraines, with warning signs usually food, weather, hormones, stress trigger her HA. She takes relpax if the ibuprophen and excedrin does not work but she is currently out of her Relpax. She says her vitamin D levels are low. No photophobia, fever, abdominal pain, cough and dysuria   Patient Active Problem List   Diagnosis Date Noted  . Postoperative anemia due to acute blood loss 05/27/2012  . Choledocholithiasis with obstruction and epigastric pain status post ERCP/sphincterotomy and laparoscopic cholecystectomy 05/24/2012  . UTI (lower urinary tract infection) 05/23/2012  . Constipation 05/23/2012  . Transaminitis 05/23/2012  . CELLULITIS, RIGHT LEG 07/24/2009  . RHINITIS 04/10/2009  . CERUMEN IMPACTION, RIGHT 03/04/2009  . MIGRAINE HEADACHE 02/09/2007  . TONSILLITIS, RECURRENT 02/09/2007  . COMMON MIGRAINE 10/20/2006   History reviewed. No pertinent past medical history. Past Surgical History  Procedure Laterality Date  . Eye surgery    . Ercp N/A 05/25/2012    Procedure: ENDOSCOPIC RETROGRADE CHOLANGIOPANCREATOGRAPHY (ERCP);  Surgeon: Willis Modena, MD;  Location: [facility] OR;  Service: Endoscopy;  Laterality: N/A;  . Cholecystectomy  N/A 05/26/2012    Procedure: LAPAROSCOPIC CHOLECYSTECTOMY WITH INTRAOPERATIVE CHOLANGIOGRAM;  Surgeon: Currie Paris, MD;  Location: [facility] ORS;  Service: General;  Laterality: N/A;   Allergies  Allergen Reactions  . Codeine     REACTION: nausea Can take codeine with phenergan   Prior to Admission medications   Medication Sig Start Date End Date Taking? Authorizing Provider  eletriptan (RELPAX) 20 MG tablet One tablet by mouth at onset of headache. May repeat in 2 hours if headache persists or recurs. may repeat in 2 hours if necessary   Yes Historical Provider, MD  HYDROcodone-acetaminophen (LORTAB) 10-500 MG per tablet Take 1 tablet by mouth every 6 (six) hours as needed for pain. 06/09/12  Yes Currie Paris, MD  Lorcaserin HCl 10 MG TABS Take 10 mg by mouth 2 (two) times daily.   Yes Historical Provider, MD  promethazine (PHENERGAN) 25 MG tablet Take 1 tablet (25 mg total) by mouth every 6 (six) hours as needed for nausea. 06/09/12  Yes Currie Paris, MD   Review of Systems  Constitutional: Positive for fever.  Eyes: Positive for visual disturbance. Negative for photophobia.  Respiratory: Negative for cough.   Gastrointestinal: Positive for nausea and vomiting. Negative for abdominal pain.  Genitourinary: Negative for dysuria.  Neurological: Positive for headaches.       Objective:   Physical Exam  Constitutional: She is oriented to person, place, and time. She appears well-developed and well-nourished. No distress.  HENT:  Head: Normocephalic and atraumatic.  Right Ear: External ear normal.  Left Ear: External ear normal.  Nose: Nose normal.  Mouth/Throat: Oropharynx is clear and moist.  TMs clear  Eyes: Conjunctivae and EOM are normal. Pupils are equal, round, and reactive to light. Left eye exhibits no discharge.  No photophobia  Neck: Normal range of motion. Neck supple. No thyromegaly present.  Cardiovascular: Normal rate, regular rhythm and normal heart sounds.     No murmur heard. Pulmonary/Chest: Effort normal and breath sounds normal. No respiratory distress.  Abdominal: Soft. Bowel sounds are normal.  Musculoskeletal: Normal range of motion. She exhibits no edema.  Lymphadenopathy:    She has no cervical adenopathy.  Neurological: She is alert and oriented to person, place, and time. No cranial nerve deficit.  Finger to nose and rapidly alternating movements intact No sensory or motor losses and extremities DTRs symmetrical Gait normal  Skin: Skin is warm and dry.  Psychiatric: She has a normal mood and affect. Her behavior is normal.  Nursing note and vitals reviewed. BP 132/84 mmHg  Pulse 86  Temp(Src) 98.1 F (36.7 C) (Oral)  Resp 20  Ht 5' 3.75" (1.619 m)  Wt 206 lb 2 oz (93.498 kg)  BMI 35.67 kg/m2  SpO2 100%  LMP 01/23/2014  Given 8 mg of Zofran and 60 mg of Toradol///over 15-20 minute she begin to get relief of both of her symptoms and so was discharged with oral medication    Assessment & Plan:  Migraine without aura and with status migrainosus, not intractable - Plan: ondansetron (ZOFRAN-ODT) disintegrating tablet 8 mg, ketorolac (TORADOL) injection 60 mg  Meds ordered this encounter  Medications  . eletriptan (RELPAX) 20 MG tablet----------- refilled     Sig: Take 1 tablet (20 mg total) by mouth as needed for migraine. may repeat in 2 hours if necessary    Dispense:  10 tablet    Refill:  1  . promethazine (PHENERGAN) 25 MG tablet    Sig: Take 1 tablet (25 mg total) by mouth every 6 (six) hours as needed for nausea.    Dispense:  30 tablet    Refill:  1  . butalbital-acetaminophen-caffeine (ESGIC) 50-325-40 MG per tablet    Sig: Take 1-2 tablets by mouth every 6 (six) hours as needed for headache.    Dispense:  25 tablet    Refill:  0  If not resolved in 24 hours follow-up for recheck   I have completed the patient encounter in its entirety as documented by the scribe, with editing by me where necessary. Robert P.  Merla Richesoolittle, M.D.

## 2014-05-07 ENCOUNTER — Other Ambulatory Visit: Payer: Self-pay | Admitting: Internal Medicine

## 2014-08-06 ENCOUNTER — Other Ambulatory Visit: Payer: Self-pay | Admitting: Internal Medicine

## 2014-08-07 ENCOUNTER — Telehealth: Payer: Self-pay

## 2014-08-07 ENCOUNTER — Ambulatory Visit (INDEPENDENT_AMBULATORY_CARE_PROVIDER_SITE_OTHER): Payer: Commercial Managed Care - HMO | Admitting: Family Medicine

## 2014-08-07 VITALS — BP 146/76 | HR 85 | Temp 98.5°F | Resp 18 | Ht 64.5 in | Wt 200.4 lb

## 2014-08-07 DIAGNOSIS — J069 Acute upper respiratory infection, unspecified: Secondary | ICD-10-CM

## 2014-08-07 DIAGNOSIS — G43009 Migraine without aura, not intractable, without status migrainosus: Secondary | ICD-10-CM

## 2014-08-07 MED ORDER — PROMETHAZINE-CODEINE 6.25-10 MG/5ML PO SYRP: 5.0000 mL | ORAL_SOLUTION | Freq: Four times a day (QID) | ORAL | Status: DC | PRN

## 2014-08-07 MED ORDER — KETOROLAC TROMETHAMINE 30 MG/ML IJ SOLN
30.0000 mg | Freq: Once | INTRAMUSCULAR | Status: AC
  Administered 2014-08-07: 30 mg via INTRAMUSCULAR

## 2014-08-07 MED ORDER — PROMETHAZINE HCL 25 MG PO TABS: 25.0000 mg | ORAL_TABLET | Freq: Four times a day (QID) | ORAL | Status: DC | PRN

## 2014-08-07 MED ORDER — PSEUDOEPHEDRINE HCL ER 120 MG PO TB12: 120.0000 mg | ORAL_TABLET | Freq: Every day | ORAL | Status: DC | PRN

## 2014-08-07 MED ORDER — IPRATROPIUM BROMIDE 0.03 % NA SOLN: 2.0000 | Freq: Four times a day (QID) | NASAL | Status: DC

## 2014-08-07 MED ORDER — BUTALBITAL-APAP-CAFFEINE 50-325-40 MG PO TABS: ORAL_TABLET | ORAL | Status: DC

## 2014-08-07 MED ORDER — ELETRIPTAN HYDROBROMIDE 20 MG PO TABS: 20.0000 mg | ORAL_TABLET | ORAL | Status: DC | PRN

## 2014-08-07 NOTE — Patient Instructions (Signed)
Do not use any otc pain medication other than tylenol/acetaminophen - so no aleve, ibuprofen, motrin, advil, excedrin etc today since you have received the toradol shot.  Do not take the promethazine/phenergan pills with your cough syrup.  The cough syrup works for cold with nasal congesiton/cough but should also work well for your current migraine since it has codiene (the pain medicine) and the promethazine/phenergan in it. If you are not starting to feel better and have your cold symptoms turn around by Friday you may come back in. If you develop fever, chills, focus sinus pain, or blood in any congestion I would recommend you come back in to see if you need stronger medication or antibiotic.  Upper Respiratory Infection, Adult An upper respiratory infection (URI) is also sometimes known as the common cold. The upper respiratory tract includes the nose, sinuses, throat, trachea, and bronchi. Bronchi are the airways leading to the lungs. Most people improve within 1 week, but symptoms can last up to 2 weeks. A residual cough may last even longer.  CAUSES Many different viruses can infect the tissues lining the upper respiratory tract. The tissues become irritated and inflamed and often become very moist. Mucus production is also common. A cold is contagious. You can easily spread the virus to others by oral contact. This includes kissing, sharing a glass, coughing, or sneezing. Touching your mouth or nose and then touching a surface, which is then touched by another person, can also spread the virus. SYMPTOMS  Symptoms typically develop 1 to 3 days after you come in contact with a cold virus. Symptoms vary from person to person. They may include:  Runny nose.  Sneezing.  Nasal congestion.  Sinus irritation.  Sore throat.  Loss of voice (laryngitis).  Cough.  Fatigue.  Muscle aches.  Loss of appetite.  Headache.  Low-grade fever. DIAGNOSIS  You might diagnose your own cold based on  familiar symptoms, since most people get a cold 2 to 3 times a year. Your caregiver can confirm this based on your exam. Most importantly, your caregiver can check that your symptoms are not due to another disease such as strep throat, sinusitis, pneumonia, asthma, or epiglottitis. Blood tests, throat tests, and X-rays are not necessary to diagnose a common cold, but they may sometimes be helpful in excluding other more serious diseases. Your caregiver will decide if any further tests are required. RISKS AND COMPLICATIONS  You may be at risk for a more severe case of the common cold if you smoke cigarettes, have chronic heart disease (such as heart failure) or lung disease (such as asthma), or if you have a weakened immune system. The very young and very old are also at risk for more serious infections. Bacterial sinusitis, middle ear infections, and bacterial pneumonia can complicate the common cold. The common cold can worsen asthma and chronic obstructive pulmonary disease (COPD). Sometimes, these complications can require emergency medical care and may be life-threatening. PREVENTION  The best way to protect against getting a cold is to practice good hygiene. Avoid oral or hand contact with people with cold symptoms. Wash your hands often if contact occurs. There is no clear evidence that vitamin C, vitamin E, echinacea, or exercise reduces the chance of developing a cold. However, it is always recommended to get plenty of rest and practice good nutrition. TREATMENT  Treatment is directed at relieving symptoms. There is no cure. Antibiotics are not effective, because the infection is caused by a virus, not by bacteria.  Treatment may include:  Increased fluid intake. Sports drinks offer valuable electrolytes, sugars, and fluids.  Breathing heated mist or steam (vaporizer or shower).  Eating chicken soup or other clear broths, and maintaining good nutrition.  Getting plenty of rest.  Using gargles  or lozenges for comfort.  Controlling fevers with ibuprofen or acetaminophen as directed by your caregiver.  Increasing usage of your inhaler if you have asthma. Zinc gel and zinc lozenges, taken in the first 24 hours of the common cold, can shorten the duration and lessen the severity of symptoms. Pain medicines may help with fever, muscle aches, and throat pain. A variety of non-prescription medicines are available to treat congestion and runny nose. Your caregiver can make recommendations and may suggest nasal or lung inhalers for other symptoms.  HOME CARE INSTRUCTIONS   Only take over-the-counter or prescription medicines for pain, discomfort, or fever as directed by your caregiver.  Use a warm mist humidifier or inhale steam from a shower to increase air moisture. This may keep secretions moist and make it easier to breathe.  Drink enough water and fluids to keep your urine clear or pale yellow.  Rest as needed.  Return to work when your temperature has returned to normal or as your caregiver advises. You may need to stay home longer to avoid infecting others. You can also use a face mask and careful hand washing to prevent spread of the virus. SEEK MEDICAL CARE IF:   After the first few days, you feel you are getting worse rather than better.  You need your caregiver's advice about medicines to control symptoms.  You develop chills, worsening shortness of breath, or brown or red sputum. These may be signs of pneumonia.  You develop yellow or brown nasal discharge or pain in the face, especially when you bend forward. These may be signs of sinusitis.  You develop a fever, swollen neck glands, pain with swallowing, or white areas in the back of your throat. These may be signs of strep throat. SEEK IMMEDIATE MEDICAL CARE IF:   You have a fever.  You develop severe or persistent headache, ear pain, sinus pain, or chest pain.  You develop wheezing, a prolonged cough, cough up  blood, or have a change in your usual mucus (if you have chronic lung disease).  You develop sore muscles or a stiff neck. Document Released: 06/16/2000 Document Revised: 03/15/2011 Document Reviewed: 03/28/2013 [facility] Patient Information 2015 [place], [place]. This information is not intended to replace advice given to you by your health care provider. Make sure you discuss any questions you have with your health care provider.

## 2014-08-07 NOTE — Progress Notes (Addendum)
Subjective:  This chart was scribed for Mariah Ferrell,  by Veverly Fells, at [facility].  This patient was seen in room 12 and the patient's care was started at 10:46 AM.    Patient ID: Mariah Ferrell, female    DOB: July 31, 1980, 34 y.o.   MRN: [id number] Chief Complaint  Patient presents with  . Migraine    started sunday night   . Nasal Congestion    started monday   . Sore Throat  . Medication Refill    butalbital    HPI  HPI Comments: Mariah Ferrell is a 34 y.o. female who presents to the [facility] complaining of a migraine (onset three days ago), congestion and sore throat (onset 2 days ago).  Patient has taken sudafed and Theraflu but denies any relief.  She has also been using generic nasal spray but states that it is not helping her much.  Patient has associated symptoms of myalgia and has not been able to sleep at night due to her migraines.  She has not taken any prescribed medications today but took 4 (200 mg) Ibuprofen's this morning.  She no longer has any Fioricet left over.   Patient states her daughter was running a fever over the weekend but she is better now.  She has no other complaints or concerns today.   Migraines: She states that she usually takes ibuprofen for the migraines and then takes Fioricet if she cant find any relief but this time, her headache seems to not be going away.  She has not taken Relpax for a long time due to the price.  Patient states that Imitrex (injections and pills) make her nauseous.  She has used Maxalt in the past but had issues with cost and insurance.  She denies any aura associated with her migraine and states that she gets pain that could sometimes be all over.  She has been to [facility] neurology for her headaches but states that nothing significant was found.  She is aware that her migraine starts up due to numerous things like stress, weather and alcohol.    -------- Her PCP appears to be  at [facility], her migraine medications were last filled by Dr. Merla Riches 6 months ago.  She has tried Excedrin and Ibuprofen which normally controls most of her migraines.  She uses Relpax if Excedrin and Ibuprofen do not work.  She has only needed 2 fills of her Fioricet in the past 6 months.     History reviewed. No pertinent past medical history.   Current Outpatient Prescriptions on File Prior to Visit  Medication Sig Dispense Refill  . butalbital-acetaminophen-caffeine (FIORICET, ESGIC) 50-325-40 MG per tablet TAKE 1 TO 2 TABLETS EVERY 6 HOURS AS NEEDED FOR HEADACHE 25 tablet 0  . promethazine (PHENERGAN) 25 MG tablet Take 1 tablet (25 mg total) by mouth every 6 (six) hours as needed for nausea. 30 tablet 1  . eletriptan (RELPAX) 20 MG tablet Take 1 tablet (20 mg total) by mouth as needed for migraine. may repeat in 2 hours if necessary 10 tablet 1  . HYDROcodone-acetaminophen (LORTAB) 10-500 MG per tablet Take 1 tablet by mouth every 6 (six) hours as needed for pain. (Patient not taking: Reported on 08/07/2014) 30 tablet 0  . Lorcaserin HCl 10 MG TABS Take 10 mg by mouth 2 (two) times daily.     No current facility-administered medications on file prior to visit.  Allergies  Allergen Reactions  . Codeine     REACTION: nausea Can take codeine with phenergan    Review of Systems  Constitutional: Positive for fatigue. Negative for fever and chills.  HENT: Positive for congestion and sore throat.   Eyes: Negative for pain, redness and itching.  Respiratory: Negative for cough, choking and shortness of breath.   Gastrointestinal: Negative for nausea and vomiting.  Musculoskeletal: Positive for myalgias.  Neurological: Positive for headaches.       Objective:   Physical Exam  Constitutional: She is oriented to person, place, and time. She appears well-developed and well-nourished. No distress.  HENT:  Head: Normocephalic and atraumatic.  Tms normal bilaterally Nasal  mucosal erythema.  tonsills 2 to 3+ bilaterally, mild erythema, no exudate.     Eyes: Conjunctivae and EOM are normal.  Neck: Neck supple. No tracheal deviation present. No thyromegaly present.  No cervical lympadeopathy  Cardiovascular: Normal rate, regular rhythm, S1 normal, S2 normal and normal heart sounds.  Exam reveals no gallop and no friction rub.   No murmur heard. Pulmonary/Chest: Effort normal and breath sounds normal. No respiratory distress. She has no wheezes. She has no rales.  Lungs clear to auscultation, good air movement.   Musculoskeletal: Normal range of motion.  Neurological: She is alert and oriented to person, place, and time.  Skin: Skin is warm and dry.  Psychiatric: She has a normal mood and affect. Her behavior is normal.  Nursing note and vitals reviewed.  Filed Vitals:   08/07/14 0905  BP: 146/76  Pulse: 85  Temp: 98.5 F (36.9 C)  TempSrc: Oral  Resp: 18  Height: 5' 4.5" (1.638 m)  Weight: 200 lb 6.4 oz (90.901 kg)  SpO2: 98%       Assessment & Plan:   1. Migraine without aura and without status migrainosus, not intractable - Pt has been unable to get relpax freq due to cost but does cover maxalt, zomig (caused pt CP prior), and axert. Took Maxalt prev and did well - pt called her insurance who will cover 4 pills/mo.    2. Upper respiratory infection, acute   Patient instructed to call on Friday if symptoms are not improving and will call in zpack. She is on day 3 of illness today and should start improving soon with viral illness   Meds ordered this encounter  Medications  . ketorolac (TORADOL) 30 MG/ML injection 30 mg    Sig:   . promethazine (PHENERGAN) 25 MG tablet    Sig: Take 1 tablet (25 mg total) by mouth every 6 (six) hours as needed for nausea.    Dispense:  30 tablet    Refill:  1  . eletriptan (RELPAX) 20 MG tablet    Sig: Take 1 tablet (20 mg total) by mouth as needed for migraine. may repeat in 2 hours if necessary     Dispense:  10 tablet    Refill:  2  . butalbital-acetaminophen-caffeine (FIORICET, ESGIC) 50-325-40 MG per tablet    Sig: TAKE 1 TO 2 TABLETS EVERY 6 HOURS AS NEEDED FOR HEADACHE    Dispense:  30 tablet    Refill:  0  . promethazine-codeine (PHENERGAN WITH CODEINE) 6.25-10 MG/5ML syrup    Sig: Take 5-10 mLs by mouth every 6 (six) hours as needed for cough.    Dispense:  120 mL    Refill:  0  . ipratropium (ATROVENT) 0.03 % nasal spray    Sig: Place 2 sprays into the  nose 4 (four) times daily.    Dispense:  30 mL    Refill:  1  . pseudoephedrine (DECONGESTANT 12HOUR MAX ST) 120 MG 12 hr tablet    Sig: Take 1 tablet (120 mg total) by mouth daily as needed for congestion.    Dispense:  14 tablet    Refill:  0    I personally performed the services described in this documentation, which was scribed in my presence. The recorded information has been reviewed and considered, and addended by me as needed.  Mariah Sorenson, Ferrell MPH

## 2014-08-07 NOTE — Telephone Encounter (Signed)
Pt's insurance wouldn't cover what the Dr had prescribed for her and she called her insurance company to see which ones they will cover It is the Rizatriptan 10 mg with 4 pills and the Dr would have to call if she wanted her to have more quantity also the Zolmitriptan 5 MG also the Almotriptan 12.5 MG Just wanted [place] to the the Zolm she has had years ago and it caused chest pains but it may not be the case anymore. Please call pt at [phone]    [facility] [address]

## 2014-08-08 MED ORDER — RIZATRIPTAN BENZOATE 10 MG PO TABS: 5.0000 mg | ORAL_TABLET | ORAL | Status: DC | PRN

## 2014-08-26 NOTE — Telephone Encounter (Signed)
i had refilled pt's relpax but d/c'd so switched pt to maxalt  4 tabs/mo - sent to below - if this is insufficient than we should be able to get a PA or switch her to other - see note from her last visit on other triptans she has failed.

## 2014-11-11 ENCOUNTER — Telehealth: Payer: Self-pay

## 2014-11-11 NOTE — Telephone Encounter (Signed)
Pt is needing to have a new to go along with the medication she is currently taking since she only gets 4 pills a month   [phone]

## 2014-11-11 NOTE — Telephone Encounter (Signed)
Pt called again regarding issue mentioned in previous message. Please advise.

## 2014-11-11 NOTE — Telephone Encounter (Addendum)
I'm confused what pt is asking for. I already gave her a yr's supply of maxalt in August so I doubt it is the refill request stated below. If she wants to start a new med, will need to come in to discuss.  If pt is having that many migraines I would highly recommend her being on a daily prophylactic medicine and/or seeing neurology. I would be happy to place a referral for her or if she wants to come in to discuss options to start prophylactic medicine that would be best.  Will send a short term supply to give her time to get in or get set up with a neurologist.

## 2014-11-11 NOTE — Telephone Encounter (Signed)
rizatriptan (MAXALT) 10 MG tablet [[id number]][[id number]]     Order Details    Dose: 5 mg Route: Oral Frequency: As needed for migraine   Dispense Quantity:  4 tablet Refills:  11 Fills Remaining:  11          Sig: Take 0.5 tablets (5 mg total) by mouth as needed for

## 2014-11-12 NOTE — Telephone Encounter (Signed)
Pt would like the Rx sent in. She will come in to discuss you first so she will come to see you and the feels this month has been one of those months where she had more migraines.

## 2014-11-12 NOTE — Telephone Encounter (Signed)
Spoke with pt, she states she would like a one-time Rx for Imitrex or Zomig (generic). Her insurance will not pay for maxalt but she is scared if she she has a migraine she will not have anything to take. She will be able to get a refill on the maxalt but not until 11/17. Sorry for the confusion Dr. Clelia CroftShaw.

## 2014-11-12 NOTE — Telephone Encounter (Signed)
Left message for pt to call back  °

## 2014-11-12 NOTE — Telephone Encounter (Signed)
Spoke with pt, advised Dr. Clelia CroftShaw message. Pt understood message.

## 2014-11-12 NOTE — Telephone Encounter (Signed)
i don't understand what rx pt wants. She had a yrs supply on her maxalt.  Does she want us to fill out a PA on her maxalt to try to get her an early refill or does she want to try a different med?  A different triptan? Any preferance which one?

## 2014-11-13 MED ORDER — SUMATRIPTAN SUCCINATE 100 MG PO TABS: 50.0000 mg | ORAL_TABLET | ORAL | Status: DC | PRN

## 2014-11-13 NOTE — Telephone Encounter (Signed)
Ok. imitrex sent to pharm.

## 2014-11-13 NOTE — Telephone Encounter (Signed)
Spoke with pt, advised Rx sent in. 

## 2015-01-23 ENCOUNTER — Ambulatory Visit (INDEPENDENT_AMBULATORY_CARE_PROVIDER_SITE_OTHER): Payer: Commercial Managed Care - HMO | Admitting: Family Medicine

## 2015-01-23 VITALS — BP 122/80 | HR 105 | Temp 99.0°F | Resp 16 | Ht 63.0 in | Wt 223.0 lb

## 2015-01-23 DIAGNOSIS — J111 Influenza due to unidentified influenza virus with other respiratory manifestations: Secondary | ICD-10-CM

## 2015-01-23 MED ORDER — OSELTAMIVIR PHOSPHATE 75 MG PO CAPS: 75.0000 mg | ORAL_CAPSULE | Freq: Two times a day (BID) | ORAL | Status: DC

## 2015-01-23 NOTE — Progress Notes (Signed)
Mariah Ferrell is a 35 y.o. female who presents to Urgent Care today for flu-like illness:  1.  Flu-like symptoms:  Patient with rhinorrhea, sore throat, cough, myalgias, subjective fever which started yesterday.  Had difficulty sleeping last night secondary to symptoms.  Persisted through today.  Due to her continuing to feel bad, presented tonight to Urgent Care.  Daughter with URI symptoms since Sunday.  She had + flu nasal swab yesterday.  Eating and drinking well.  No dyspnea. No wheezing.  No history of asthma.    Did not get flu shot.  ROS as above otherwise neg.  Marland Kitchen   PMH reviewed.  History reviewed. No pertinent past medical history. Past Surgical History  Procedure Laterality Date  . Eye surgery    . Ercp N/A 05/25/2012    Procedure: ENDOSCOPIC RETROGRADE CHOLANGIOPANCREATOGRAPHY (ERCP);  Surgeon: Willis Modena, MD;  Location: [facility] OR;  Service: Endoscopy;  Laterality: N/A;  . Cholecystectomy N/A 05/26/2012    Procedure: LAPAROSCOPIC CHOLECYSTECTOMY WITH INTRAOPERATIVE CHOLANGIOGRAM;  Surgeon: Currie Paris, MD;  Location: [facility] ORS;  Service: General;  Laterality: N/A;    Medications reviewed. Current Outpatient Prescriptions  Medication Sig Dispense Refill  . butalbital-acetaminophen-caffeine (FIORICET, ESGIC) 50-325-40 MG per tablet TAKE 1 TO 2 TABLETS EVERY 6 HOURS AS NEEDED FOR HEADACHE 30 tablet 0  . ipratropium (ATROVENT) 0.03 % nasal spray Place 2 sprays into the nose 4 (four) times daily. 30 mL 1  . Lorcaserin HCl 10 MG TABS Take 10 mg by mouth 2 (two) times daily.    . promethazine (PHENERGAN) 25 MG tablet Take 1 tablet (25 mg total) by mouth every 6 (six) hours as needed for nausea. 30 tablet 1  . promethazine-codeine (PHENERGAN WITH CODEINE) 6.25-10 MG/5ML syrup Take 5-10 mLs by mouth every 6 (six) hours as needed for cough. 120 mL 0  . pseudoephedrine (DECONGESTANT 12HOUR MAX ST) 120 MG 12 hr tablet Take 1 tablet (120 mg total) by mouth daily as needed for  congestion. 14 tablet 0  . rizatriptan (MAXALT) 10 MG tablet Take 0.5 tablets (5 mg total) by mouth as needed for migraine. May repeat in 2 hours if needed 4 tablet 11  . SUMAtriptan (IMITREX) 100 MG tablet Take 0.5-1 tablets (50-100 mg total) by mouth every 2 (two) hours as needed for migraine. May repeat in 2 hours if headache persists 10 tablet 1   No current facility-administered medications for this visit.     Physical Exam:  BP 122/80 mmHg  Pulse 105  Temp(Src) 99 F (37.2 C) (Oral)  Resp 16  Ht  (1.6 m)  Wt 223 lb (101.152 kg)  BMI 39.51 kg/m2  SpO2 99% Gen:  Patient sitting on exam table, appears stated age in no acute distress.  Not toxic appearing, but does appear not to feel well.  Head: Normocephalic atraumatic Eyes: EOMI, PERRL, sclera and conjunctiva non-erythematous Ears:  Canals clear bilaterally.  TMs pearly gray bilaterally without erythema or bulging.   Nose:  Nasal turbinates grossly enlarged bilaterally. Some exudates noted. Mild tenderness to palpation of sinuses. Neck: No cervical lymphadenopathy noted Heart:  RRR, no murmurs auscultated. Pulm:  Clear to auscultation bilaterally with good air movement.  No wheezing   Assessment and Plan:  1.  Flu: - mostly likely diagnosis, especially with close family contact just diagnosed with flu.   - discussed testing, but will likely treat even if negative test.  Therefore will defer testing.  - Will treat with Tamiflu and symptomatic  treatment.  - Tamiflu BID x 5 days.

## 2015-01-23 NOTE — Patient Instructions (Signed)
Take the Tamiflu twice daily for the next 5 days.   Influenza, Adult Influenza ("the flu") is a viral infection of the respiratory tract. It occurs more often in winter months because people spend more time in close contact with one another. Influenza can make you feel very sick. Influenza easily spreads from person to person (contagious). CAUSES  Influenza is caused by a virus that infects the respiratory tract. You can catch the virus by breathing in droplets from an infected person's cough or sneeze. You can also catch the virus by touching something that was recently contaminated with the virus and then touching your mouth, nose, or eyes. RISKS AND COMPLICATIONS You may be at risk for a more severe case of influenza if you smoke cigarettes, have diabetes, have chronic heart disease (such as heart failure) or lung disease (such as asthma), or if you have a weakened immune system. Elderly people and pregnant women are also at risk for more serious infections. The most common problem of influenza is a lung infection (pneumonia). Sometimes, this problem can require emergency medical care and may be life threatening. SIGNS AND SYMPTOMS  Symptoms typically last 4 to 10 days and may include:  Fever.  Chills.  Headache, body aches, and muscle aches.  Sore throat.  Chest discomfort and cough.  Poor appetite.  Weakness or feeling tired.  Dizziness.  Nausea or vomiting. DIAGNOSIS  Diagnosis of influenza is often made based on your history and a physical exam. A nose or throat swab test can be done to confirm the diagnosis. TREATMENT  In mild cases, influenza goes away on its own. Treatment is directed at relieving symptoms. For more severe cases, your health care provider may prescribe antiviral medicines to shorten the sickness. Antibiotic medicines are not effective because the infection is caused by a virus, not by bacteria. HOME CARE INSTRUCTIONS  Take medicines only as directed by  your health care provider.  Use a cool mist humidifier to make breathing easier.  Get plenty of rest until your temperature returns to normal. This usually takes 3 to 4 days.  Drink enough fluid to keep your urine clear or pale yellow.  Cover yourmouth and nosewhen coughing or sneezing,and wash your handswellto prevent thevirusfrom spreading.  Stay homefromwork orschool untilthe fever is gonefor at least 58full day. PREVENTION  An annual influenza vaccination (flu shot) is the best way to avoid getting influenza. An annual flu shot is now routinely recommended for all adults in the [place] SEEK MEDICAL CARE IF:  You experiencechest pain, yourcough worsens,or you producemore mucus.  Youhave nausea,vomiting, ordiarrhea.  Your fever returns or gets worse. SEEK IMMEDIATE MEDICAL CARE IF:  You havetrouble breathing, you become short of breath,or your skin ornails becomebluish.  You have severe painor stiffnessin the neck.  You develop a sudden headache, or pain in the face or ear.  You have nausea or vomiting that you cannot control. MAKE SURE YOU:   Understand these instructions.  Will watch your condition.  Will get help right away if you are not doing well or get worse.   This information is not intended to replace advice given to you by your health care provider. Make sure you discuss any questions you have with your health care provider.   Document Released: 12/19/1999 Document Revised: 01/11/2014 Document Reviewed: 03/22/2011 Elsevier Interactive Patient Education [employer]

## 2015-05-21 ENCOUNTER — Ambulatory Visit (INDEPENDENT_AMBULATORY_CARE_PROVIDER_SITE_OTHER): Payer: Commercial Managed Care - HMO | Admitting: Physician Assistant

## 2015-05-21 DIAGNOSIS — M791 Myalgia: Secondary | ICD-10-CM

## 2015-05-21 DIAGNOSIS — M7918 Myalgia, other site: Secondary | ICD-10-CM

## 2015-05-21 MED ORDER — CYCLOBENZAPRINE HCL 10 MG PO TABS: 5.0000 mg | ORAL_TABLET | Freq: Three times a day (TID) | ORAL | Status: DC | PRN

## 2015-05-21 MED ORDER — IBUPROFEN 600 MG PO TABS: 600.0000 mg | ORAL_TABLET | Freq: Four times a day (QID) | ORAL | Status: DC | PRN

## 2015-05-21 NOTE — Patient Instructions (Signed)
       We recommend that you schedule a mammogram for breast cancer screening. Typically, you do not need a referral to do this. Please contact a local imaging center to schedule your mammogram.  [facility] - [phone]  *ask for the Radiology Department [facility]) - [phone] or [phone]  [facility] - [phone] [facility] - [phone] [facility] - [phone]  *ask for the Radiology Department [facility] - [phone]  *ask for the Radiology Department [facility] - [phone]  *ask for the Mammography Department [facility] - [phone]      IF you received an x-ray today, you will receive an invoice from [facility]. Please contact [facility] at [phone] with questions or concerns regarding your invoice.   IF you received labwork today, you will receive an invoice from [facility]. Please contact [facility] at [phone] with questions or concerns regarding your invoice.   Our billing staff will not be able to assist you with questions regarding bills from these companies.  You will be contacted with the lab results as soon as they are available. The fastest way to get your results is to activate your My Chart account. Instructions are located on the last page of this paperwork. If you have not heard from us regarding the results in 2 weeks, please contact this office.       
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
## 2015-05-23 NOTE — Progress Notes (Signed)
Please check Mariah Ferrell there was no documentation of any neck or back exam.

## 2015-06-17 ENCOUNTER — Emergency Department (HOSPITAL_COMMUNITY)
Admission: EM | Admit: 2015-06-17 | Discharge: 2015-06-17 | Disposition: A | Discharge status: Home / Self Care | Payer: No Typology Code available for payment source | Attending: Emergency Medicine | Admitting: Emergency Medicine

## 2015-06-17 ENCOUNTER — Emergency Department (HOSPITAL_COMMUNITY): Payer: No Typology Code available for payment source

## 2015-06-17 ENCOUNTER — Encounter (HOSPITAL_COMMUNITY): Payer: Self-pay | Admitting: Emergency Medicine

## 2015-06-17 DIAGNOSIS — S299XXA Unspecified injury of thorax, initial encounter: Secondary | ICD-10-CM | POA: Diagnosis present

## 2015-06-17 DIAGNOSIS — Y939 Activity, unspecified: Secondary | ICD-10-CM | POA: Insufficient documentation

## 2015-06-17 DIAGNOSIS — Z79899 Other long term (current) drug therapy: Secondary | ICD-10-CM | POA: Insufficient documentation

## 2015-06-17 DIAGNOSIS — Y92410 Unspecified street and highway as the place of occurrence of the external cause: Secondary | ICD-10-CM | POA: Diagnosis not present

## 2015-06-17 DIAGNOSIS — S20212A Contusion of left front wall of thorax, initial encounter: Secondary | ICD-10-CM | POA: Diagnosis not present

## 2015-06-17 DIAGNOSIS — Y999 Unspecified external cause status: Secondary | ICD-10-CM | POA: Diagnosis not present

## 2015-06-17 DIAGNOSIS — S20219A Contusion of unspecified front wall of thorax, initial encounter: Secondary | ICD-10-CM

## 2015-06-17 HISTORY — DX: Migraine, unspecified, not intractable, without status migrainosus: G43.909

## 2015-06-17 LAB — POC URINE PREG, ED: PREG TEST UR: NEGATIVE

## 2015-06-17 MED ORDER — HYDROCODONE-ACETAMINOPHEN 5-325 MG PO TABS
2.0000 | ORAL_TABLET | Freq: Once | ORAL | Status: AC
  Administered 2015-06-17: 2 via ORAL
  Filled 2015-06-17: qty 2

## 2015-06-17 MED ORDER — PROMETHAZINE HCL 25 MG PO TABS: 25.0000 mg | ORAL_TABLET | Freq: Four times a day (QID) | ORAL | Status: DC | PRN

## 2015-06-17 MED ORDER — NAPROXEN 500 MG PO TABS: 500.0000 mg | ORAL_TABLET | Freq: Two times a day (BID) | ORAL | Status: DC

## 2015-06-17 MED ORDER — HYDROCODONE-ACETAMINOPHEN 5-325 MG PO TABS: 1.0000 | ORAL_TABLET | ORAL | Status: DC | PRN

## 2015-06-17 MED ORDER — PROMETHAZINE HCL 25 MG PO TABS
25.0000 mg | ORAL_TABLET | Freq: Once | ORAL | Status: AC
  Administered 2015-06-17: 25 mg via ORAL
  Filled 2015-06-17: qty 1

## 2015-06-17 NOTE — ED Notes (Signed)
Pt c/o chest and headache-- dr Criss Alvinegoldston at bedside.
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
## 2015-06-17 NOTE — ED Notes (Signed)
Patient totally undressed, in gown, on continuous pulse oximetry and blood pressure cuff

## 2015-07-18 ENCOUNTER — Ambulatory Visit (INDEPENDENT_AMBULATORY_CARE_PROVIDER_SITE_OTHER): Payer: BLUE CROSS/BLUE SHIELD | Admitting: Medical

## 2015-07-18 ENCOUNTER — Encounter: Payer: Self-pay | Admitting: Medical

## 2015-07-18 VITALS — BP 110/80 | HR 83 | Wt 209.0 lb

## 2015-07-18 DIAGNOSIS — R0789 Other chest pain: Secondary | ICD-10-CM | POA: Diagnosis not present

## 2015-07-18 DIAGNOSIS — M546 Pain in thoracic spine: Secondary | ICD-10-CM

## 2015-07-18 DIAGNOSIS — M25512 Pain in left shoulder: Secondary | ICD-10-CM

## 2015-07-18 DIAGNOSIS — M25511 Pain in right shoulder: Secondary | ICD-10-CM

## 2015-07-18 DIAGNOSIS — M940 Chondrocostal junction syndrome [Tietze]: Secondary | ICD-10-CM | POA: Diagnosis not present

## 2015-07-18 DIAGNOSIS — M549 Dorsalgia, unspecified: Secondary | ICD-10-CM

## 2015-07-18 NOTE — Addendum Note (Signed)
Addended by: Kieth BrightlyLAWSON, Candice Tobey M on: 07/18/2015 03:43 PM   Modules accepted: Orders
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
## 2015-07-18 NOTE — Patient Instructions (Signed)
Massage Therapy:  Janet Blevins [facility] [address] , [place] [address] [phone] [email or web address]   

## 2015-10-13 ENCOUNTER — Other Ambulatory Visit: Payer: Self-pay | Admitting: Physician Assistant

## 2015-10-13 DIAGNOSIS — M7918 Myalgia, other site: Secondary | ICD-10-CM

## 2015-10-27 ENCOUNTER — Other Ambulatory Visit: Payer: Self-pay | Admitting: Medical

## 2015-10-27 ENCOUNTER — Telehealth: Payer: Self-pay | Admitting: Medical

## 2015-10-27 MED ORDER — SCOPOLAMINE 1 MG/3DAYS TD PT72: 1.0000 | MEDICATED_PATCH | TRANSDERMAL | 0 refills | Status: DC

## 2015-10-27 NOTE — Telephone Encounter (Signed)
Scopolamine patch for motion sickness sent

## 2015-10-27 NOTE — Telephone Encounter (Signed)
Pt notified via VCM that patch sent to pharmacy

## 2015-10-27 NOTE — Telephone Encounter (Signed)
Pt called and stated that she is leaving on a cruise. She would like something sent in for her for motion sickness just in case. Pt uses [facility] [address] And can be reached at [phone].

## 2016-06-10 ENCOUNTER — Other Ambulatory Visit: Payer: Self-pay | Admitting: Family Medicine

## 2016-06-11 NOTE — Telephone Encounter (Signed)
Has not been seen in our office in over one year. Request is for OTC med that patient could purchase without prescription if needed. Therefore declined refill. Rec patient discuss use of this medication with her PCP Mr. Tysinger at [facility] or always welcome to make a return visit to [place] to address of course.

## 2016-07-26 ENCOUNTER — Ambulatory Visit (INDEPENDENT_AMBULATORY_CARE_PROVIDER_SITE_OTHER): Payer: BLUE CROSS/BLUE SHIELD

## 2016-07-26 ENCOUNTER — Ambulatory Visit (INDEPENDENT_AMBULATORY_CARE_PROVIDER_SITE_OTHER): Payer: BLUE CROSS/BLUE SHIELD | Admitting: Physician Assistant

## 2016-07-26 DIAGNOSIS — S60221A Contusion of right hand, initial encounter: Secondary | ICD-10-CM

## 2016-07-26 DIAGNOSIS — M545 Low back pain, unspecified: Secondary | ICD-10-CM

## 2016-07-26 DIAGNOSIS — M25511 Pain in right shoulder: Secondary | ICD-10-CM

## 2016-07-26 DIAGNOSIS — S20312A Abrasion of left front wall of thorax, initial encounter: Secondary | ICD-10-CM

## 2016-07-26 DIAGNOSIS — M898X1 Other specified disorders of bone, shoulder: Secondary | ICD-10-CM

## 2016-07-26 DIAGNOSIS — S5011XA Contusion of right forearm, initial encounter: Secondary | ICD-10-CM | POA: Diagnosis not present

## 2016-07-26 LAB — POCT URINALYSIS DIP (MANUAL ENTRY)
Bilirubin, UA: NEGATIVE
Glucose, UA: NEGATIVE mg/dL
Ketones, POC UA: NEGATIVE mg/dL
LEUKOCYTES UA: NEGATIVE
Nitrite, UA: NEGATIVE
PH UA: 7 (ref 5.0–8.0)
Protein Ur, POC: NEGATIVE mg/dL
Spec Grav, UA: 1.02 (ref 1.010–1.025)
UROBILINOGEN UA: 2 U/dL — AB

## 2016-07-26 LAB — POCT URINE PREGNANCY: PREG TEST UR: NEGATIVE

## 2016-07-26 MED ORDER — CYCLOBENZAPRINE HCL 10 MG PO TABS: 10.0000 mg | ORAL_TABLET | Freq: Three times a day (TID) | ORAL | 0 refills | Status: DC | PRN

## 2016-07-26 MED ORDER — MELOXICAM 15 MG PO TABS: 15.0000 mg | ORAL_TABLET | Freq: Every day | ORAL | 0 refills | Status: DC
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
## 2016-07-31 ENCOUNTER — Encounter: Payer: Self-pay | Admitting: Physician Assistant
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
## 2016-09-22 ENCOUNTER — Encounter: Payer: Self-pay | Admitting: Medical

## 2016-09-23 ENCOUNTER — Telehealth: Payer: Self-pay | Admitting: Medical

## 2016-09-23 NOTE — Telephone Encounter (Signed)
I received e-mail request for motion sickness patch.  Last visit > 1 year ago and chart shows she established elsewhere.  I'll be happy to see her back for appt.

## 2016-09-27 ENCOUNTER — Encounter: Payer: Self-pay | Admitting: Physician Assistant

## 2016-09-27 NOTE — Telephone Encounter (Signed)
Called and left message for pt to give us a call back.

## 2016-11-19 LAB — HM PAP SMEAR: HM Pap smear: NEGATIVE

## 2016-11-19 LAB — RESULTS CONSOLE HPV: CHL HPV: NEGATIVE

## 2017-01-26 ENCOUNTER — Other Ambulatory Visit: Payer: Self-pay

## 2017-01-26 ENCOUNTER — Encounter: Payer: Self-pay | Admitting: Physician Assistant

## 2017-01-26 ENCOUNTER — Ambulatory Visit (INDEPENDENT_AMBULATORY_CARE_PROVIDER_SITE_OTHER): Payer: BLUE CROSS/BLUE SHIELD | Admitting: Physician Assistant

## 2017-01-26 VITALS — BP 122/80 | HR 75 | Temp 98.2°F | Resp 16 | Ht 63.78 in | Wt 177.0 lb

## 2017-01-26 DIAGNOSIS — M545 Low back pain, unspecified: Secondary | ICD-10-CM

## 2017-01-26 DIAGNOSIS — N62 Hypertrophy of breast: Secondary | ICD-10-CM | POA: Diagnosis not present

## 2017-01-26 NOTE — Progress Notes (Deleted)
PRIMARY CARE AT [facility] [address] [place] [address] [phone]  Date:  01/26/2017   Name:  Mariah Ferrell   DOB:  [birth year]   MRN:  [id number]  PCP:  Jac Canavan, PA-C    History of Present Illness:  Mariah Ferrell is a 37 y.o. female patient who presents to PCP with  Chief Complaint  Patient presents with  . Referral    pt states she would like referral for plastic surgery for breast reduction        Patient Active Problem List   Diagnosis Date Noted  . Postoperative anemia due to acute blood loss 05/27/2012  . Choledocholithiasis with obstruction and epigastric pain status post ERCP/sphincterotomy and laparoscopic cholecystectomy 05/24/2012  . UTI (lower urinary tract infection) 05/23/2012  . Constipation 05/23/2012  . Transaminitis 05/23/2012  . CELLULITIS, RIGHT LEG 07/24/2009  . RHINITIS 04/10/2009  . Migraine 02/09/2007  . TONSILLITIS, RECURRENT 02/09/2007  . COMMON MIGRAINE 10/20/2006    Past Medical History:  Diagnosis Date  . Migraines     Past Surgical History:  Procedure Laterality Date  . CHOLECYSTECTOMY N/A 05/26/2012   Procedure: LAPAROSCOPIC CHOLECYSTECTOMY WITH INTRAOPERATIVE CHOLANGIOGRAM;  Surgeon: Currie Paris, MD;  Location: [facility];  Service: General;  Laterality: N/A;  . ERCP N/A 05/25/2012   Procedure: ENDOSCOPIC RETROGRADE CHOLANGIOPANCREATOGRAPHY (ERCP);  Surgeon: Willis Modena, MD;  Location: [facility] OR;  Service: Endoscopy;  Laterality: N/A;  . EYE SURGERY      Social History   Tobacco Use  . Smoking status: Never Smoker  . Smokeless tobacco: Never Used  Substance Use Topics  . Alcohol use: Yes    Alcohol/week: 0.0 oz  . Drug use: No    Family History  Problem Relation Age of Onset  . Hypertension Mother     Allergies  Allergen Reactions  . Codeine     REACTION: nausea Can take codeine with phenergan    Medication list has been reviewed and updated.  Current Outpatient Medications on File Prior to  Visit  Medication Sig Dispense Refill  . chlorzoxazone (PARAFON) 500 MG tablet Take 750 mg by mouth 4 (four) times daily as needed for muscle spasms.     . cyclobenzaprine (FLEXERIL) 10 MG tablet Take 1 tablet (10 mg total) by mouth 3 (three) times daily as needed for muscle spasms. 30 tablet 0  . ipratropium (ATROVENT) 0.03 % nasal spray Place 2 sprays into the nose 4 (four) times daily. (Patient not taking: Reported on 07/18/2015) 30 mL 1  . levonorgestrel (MIRENA) 20 MCG/24HR IUD 1 each by Intrauterine route once. June 2016    . meloxicam (MOBIC) 15 MG tablet Take 1 tablet (15 mg total) by mouth daily. 30 tablet 0  . naproxen (NAPROSYN) 500 MG tablet Take 1 tablet (500 mg total) by mouth 2 (two) times daily with a meal. (Patient not taking: Reported on 07/18/2015) 14 tablet 0  . promethazine (PHENERGAN) 25 MG tablet Take 25 mg by mouth every 6 (six) hours as needed for nausea or vomiting.    Marland Kitchen scopolamine (TRANSDERM-SCOP, 1.5 MG,) 1 MG/3DAYS Place 1 patch (1.5 mg total) onto the skin every 3 (three) days. (Patient not taking: Reported on 07/26/2016) 4 patch 0  . SUMAtriptan (IMITREX) 100 MG tablet Take 0.5-1 tablets (50-100 mg total) by mouth every 2 (two) hours as needed for migraine. May repeat in 2 hours if headache persists (Patient not taking: Reported on 07/18/2015) 10 tablet 1  . tiZANidine (ZANAFLEX) 4  MG tablet Take 4 mg by mouth every 6 (six) hours as needed for muscle spasms.    Marland Kitchen. topiramate (TOPAMAX) 100 MG tablet Take 100 mg by mouth 2 (two) times daily.     No current facility-administered medications on file prior to visit.     ROS ROS otherwise unremarkable unless listed above.  Physical Examination: There were no vitals taken for this visit. Ideal Body Weight:    Physical Exam   Assessment and Plan: Mariah Ferrell is a 37 y.o. female who is here today  There are no diagnoses linked to this encounter.  Trena PlattStephanie Saniyyah Elster, PA-C [facility]
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
## 2017-01-26 NOTE — Patient Instructions (Signed)
     IF you received an x-ray today, you will receive an invoice from [facility]. Please contact [facility] at [phone] with questions or concerns regarding your invoice.   IF you received labwork today, you will receive an invoice from [facility]. Please contact [facility] at [phone] with questions or concerns regarding your invoice.   Our billing staff will not be able to assist you with questions regarding bills from these companies.  You will be contacted with the lab results as soon as they are available. The fastest way to get your results is to activate your My Chart account. Instructions are located on the last page of this paperwork. If you have not heard from us regarding the results in 2 weeks, please contact this office.     

## 2017-01-31 ENCOUNTER — Encounter: Payer: Self-pay | Admitting: Physician Assistant

## 2017-02-03 NOTE — Telephone Encounter (Signed)
Pt calling for a f/u on letter.  Please contact pt when letter is ready for pick up.

## 2017-02-03 NOTE — Telephone Encounter (Signed)
Please advise pt on letter.

## 2017-02-07 NOTE — Telephone Encounter (Signed)
PT called to check on status on this letter.  PT states she is getting impatient.

## 2017-02-09 ENCOUNTER — Telehealth: Payer: Self-pay | Admitting: Physician Assistant

## 2017-02-09 NOTE — Telephone Encounter (Signed)
Note regarding back and shoulder pain

## 2017-02-09 NOTE — Telephone Encounter (Signed)
Patient calling and checking status. Please advise [phone]

## 2017-04-13 ENCOUNTER — Encounter: Payer: Self-pay | Admitting: Physician Assistant

## 2017-06-21 ENCOUNTER — Telehealth: Payer: Self-pay | Admitting: Physician Assistant

## 2017-06-21 NOTE — Telephone Encounter (Signed)
Copied from CRM [id number]. Topic: Referral - Request >> Jun 20, 2017  4:37 PM Alexander BergeronBarksdale, Harvey B wrote: Reason for CRM: pt called to get a [facility], [facility]  FORMER ENGLISH PATIENT

## 2017-06-22 NOTE — Telephone Encounter (Signed)
Message to Mariah Ferrell re: [facility]

## 2017-06-24 NOTE — Telephone Encounter (Signed)
RTC or see Tysinger.  PA English no longer with the practice. Mariah BostonMichael Eris Hannan, MS, PA-C 4:22 PM, 06/24/2017

## 2017-06-27 NOTE — Telephone Encounter (Signed)
Informed patient per note below Patient voiced understanding

## 2017-10-24 ENCOUNTER — Encounter: Payer: Self-pay | Admitting: Family Medicine

## 2017-10-24 ENCOUNTER — Other Ambulatory Visit: Payer: Self-pay

## 2017-10-24 ENCOUNTER — Ambulatory Visit (INDEPENDENT_AMBULATORY_CARE_PROVIDER_SITE_OTHER): Payer: BLUE CROSS/BLUE SHIELD | Admitting: Family Medicine

## 2017-10-24 VITALS — BP 120/80 | HR 114 | Temp 102.3°F | Resp 18 | Ht <= 58 in | Wt 177.0 lb

## 2017-10-24 DIAGNOSIS — R509 Fever, unspecified: Secondary | ICD-10-CM | POA: Diagnosis not present

## 2017-10-24 DIAGNOSIS — R6889 Other general symptoms and signs: Secondary | ICD-10-CM

## 2017-10-24 DIAGNOSIS — R102 Pelvic and perineal pain: Secondary | ICD-10-CM

## 2017-10-24 LAB — POCT CBC
GRANULOCYTE PERCENT: 78.8 % (ref 37–80)
HCT, POC: 36.9 % (ref 29–41)
HEMOGLOBIN: 12.5 g/dL (ref 9.5–13.5)
Lymph, poc: 1 (ref 0.6–3.4)
MCH: 27 pg (ref 27–31.2)
MCHC: 33.9 g/dL (ref 31.8–35.4)
MCV: 79.7 fL (ref 76–111)
MID (CBC): 0.4 (ref 0–0.9)
MPV: 6.8 fL (ref 0–99.8)
PLATELET COUNT, POC: 174 10*3/uL (ref 142–424)
POC Granulocyte: 5.1 (ref 2–6.9)
POC LYMPH PERCENT: 15.2 %L (ref 10–50)
POC MID %: 6 % (ref 0–12)
RBC: 4.62 M/uL (ref 4.04–5.48)
RDW, POC: 13.2 %
WBC: 6.5 10*3/uL (ref 4.6–10.2)

## 2017-10-24 LAB — POCT URINALYSIS DIP (MANUAL ENTRY)
Bilirubin, UA: NEGATIVE
Blood, UA: NEGATIVE
GLUCOSE UA: NEGATIVE mg/dL
LEUKOCYTES UA: NEGATIVE
Nitrite, UA: NEGATIVE
SPEC GRAV UA: 1.025 (ref 1.010–1.025)
Urobilinogen, UA: 0.2 E.U./dL
pH, UA: 5.5 (ref 5.0–8.0)

## 2017-10-24 LAB — POCT INFLUENZA A/B
Influenza A, POC: NEGATIVE
Influenza B, POC: NEGATIVE

## 2017-10-24 LAB — POC MICROSCOPIC URINALYSIS (UMFC): Mucus: ABSENT

## 2017-10-24 MED ORDER — OSELTAMIVIR PHOSPHATE 75 MG PO CAPS: 75.0000 mg | ORAL_CAPSULE | Freq: Two times a day (BID) | ORAL | 0 refills | Status: DC

## 2017-10-24 NOTE — Patient Instructions (Addendum)
Although the flu test was negative, this still appears to be due to influenza or flu-like illness.  Blood counts appeared normal, urine test did not appear to have infection but I will check a urine culture.  Can start Tamiflu as discussed. Saline nasal spray at least 4 times per day if needed for nasal congestion, over the counter mucinex or mucinex DM as needed for cough, tylenol or ibuprofen over the counter for fever and body aches, and drink plenty of fluids. Other information as in instructions below.  Return to the clinic or go to the nearest emergency room if any of your symptoms worsen or new symptoms occur.  You should avoid returning to work until you have been fever free for 48 hours.  Influenza, Adult Influenza, more commonly known as "the flu," is a viral infection that primarily affects the respiratory tract. The respiratory tract includes organs that help you breathe, such as the lungs, nose, and throat. The flu causes many common cold symptoms, as well as a high fever and body aches. The flu spreads easily from person to person (is contagious). Getting a flu shot (influenza vaccination) every year is the best way to prevent influenza. What are the causes? Influenza is caused by a virus. You can catch the virus by:  Breathing in droplets from an infected person's cough or sneeze.  Touching something that was recently contaminated with the virus and then touching your mouth, nose, or eyes.  What increases the risk? The following factors may make you more likely to get the flu:  Not cleaning your hands frequently with soap and water or alcohol-based hand sanitizer.  Having close contact with many people during cold and flu season.  Touching your mouth, eyes, or nose without washing or sanitizing your hands first.  Not drinking enough fluids or not eating a healthy diet.  Not getting enough sleep or exercise.  Being under a high amount of stress.  Not getting a yearly  (annual) flu shot.  You may be at a higher risk of complications from the flu, such as a severe lung infection (pneumonia), if you:  Are over the age of 46.  Are pregnant.  Have a weakened disease-fighting system (immune system). You may have a weakened immune system if you: ? Have HIV or AIDS. ? Are undergoing chemotherapy. ? Aretaking medicines that reduce the activity of (suppress) the immune system.  Have a long-term (chronic) illness, such as heart disease, kidney disease, diabetes, or lung disease.  Have a liver disorder.  Are obese.  Have anemia.  What are the signs or symptoms? Symptoms of this condition typically last 4-10 days and may include:  Fever.  Chills.  Headache, body aches, or muscle aches.  Sore throat.  Cough.  Runny or congested nose.  Chest discomfort and cough.  Poor appetite.  Weakness or tiredness (fatigue).  Dizziness.  Nausea or vomiting.  How is this diagnosed? This condition may be diagnosed based on your medical history and a physical exam. Your health care provider may do a nose or throat swab test to confirm the diagnosis. How is this treated? If influenza is detected early, you can be treated with antiviral medicine that can reduce the length of your illness and the severity of your symptoms. This medicine may be given by mouth (orally) or through an IV tube that is inserted in one of your veins. The goal of treatment is to relieve symptoms by taking care of yourself at home. This may  include taking over-the-counter medicines, drinking plenty of fluids, and adding humidity to the air in your home. In some cases, influenza goes away on its own. Severe influenza or complications from influenza may be treated in a hospital. Follow these instructions at home:  Take over-the-counter and prescription medicines only as told by your health care provider.  Use a cool mist humidifier to add humidity to the air in your home. This can  make breathing easier.  Rest as needed.  Drink enough fluid to keep your urine clear or pale yellow.  Cover your mouth and nose when you cough or sneeze.  Wash your hands with soap and water often, especially after you cough or sneeze. If soap and water are not available, use hand sanitizer.  Stay home from work or school as told by your health care provider. Unless you are visiting your health care provider, try to avoid leaving home until your fever has been gone for 24 hours without the use of medicine.  Keep all follow-up visits as told by your health care provider. This is important. How is this prevented?  Getting an annual flu shot is the best way to avoid getting the flu. You may get the flu shot in late summer, fall, or winter. Ask your health care provider when you should get your flu shot.  Wash your hands often or use hand sanitizer often.  Avoid contact with people who are sick during cold and flu season.  Eat a healthy diet, drink plenty of fluids, get enough sleep, and exercise regularly. Contact a health care provider if:  You develop new symptoms.  You have: ? Chest pain. ? Diarrhea. ? A fever.  Your cough gets worse.  You produce more mucus.  You feel nauseous or you vomit. Get help right away if:  You develop shortness of breath or difficulty breathing.  Your skin or nails turn a bluish color.  You have severe pain or stiffness in your neck.  You develop a sudden headache or sudden pain in your face or ear.  You cannot stop vomiting. This information is not intended to replace advice given to you by your health care provider. Make sure you discuss any questions you have with your health care provider. Document Released: 12/19/1999 Document Revised: 05/29/2015 Document Reviewed: 10/15/2014 Elsevier Interactive Patient Education  [employer].    If you have lab work done today you will be contacted with your lab results within the next 2  weeks.  If you have not heard from [place] then please contact us. The fastest way to get your results is to register for My Chart.   IF you received an x-ray today, you will receive an invoice from [facility]. Please contact [facility] at [phone] with questions or concerns regarding your invoice.   IF you received labwork today, you will receive an invoice from [place]. Please contact [facility] at [phone] with questions or concerns regarding your invoice.   Our billing staff will not be able to assist you with questions regarding bills from these companies.  You will be contacted with the lab results as soon as they are available. The fastest way to get your results is to activate your My Chart account. Instructions are located on the last page of this paperwork. If you have not heard from [place] regarding the results in 2 weeks, please contact this office.

## 2017-10-24 NOTE — Progress Notes (Signed)
Called pt to check to see if she was still our pt

## 2017-10-24 NOTE — Progress Notes (Signed)
Subjective:  By signing my name below, I, Stann Ore, attest that this documentation has been prepared under the direction and in the presence of Meredith Staggers, MD. Electronically Signed: Stann Ore, Scribe. 10/24/2017 , 2:22 PM .  Patient was seen in Room 11 .   Patient ID: Mariah Ferrell, female    DOB: [birth year], 37 y.o.   MRN: [id number] Chief Complaint  Patient presents with  . Fever    flu like symptoms-fever, chills, fatigue,headache   HPI Mariah Ferrell is a 37 y.o. female Patient is here with flu like symptoms with fever, chills, fatigue and headache. She reports having back, neck and shoulder pain prior, and was seen by rheumatologist about 10 days ago; thought more arthritis. She also mentions flying down to [facility] on Oct 12th, and returned on last Wednesday Oct 16th. She was at work last Friday, and today only. She went to the fair on Saturday Oct 19th, waking up with a headache earlier that day. She took some Maxalt. During her trip home on the train returning to [facility] at around 6:00PM and running some errands at that time, she felt her teeth chattering with chills and fever of 100.9. She denies cough, sore throat, rhinorrhea or congestion. She also notes low back pain and just generalized back pain throughout, "feels like I got hit by a [employer]". She denies any photosensitivity. She notes having some low abdominal pain starting today; denies urinary frequency, urgency or dysuria. She denies going hiking or into the woods recently; no tick bites. She mentions having gall bladder surgery done in 2014, and had 6 stones in her bile duct. She's taken theraflu.   She works at [employer].   Patient Active Problem List   Diagnosis Date Noted  . Postoperative anemia due to acute blood loss 05/27/2012  . Choledocholithiasis with obstruction and epigastric pain status post ERCP/sphincterotomy and laparoscopic cholecystectomy 05/24/2012  . UTI (lower  urinary tract infection) 05/23/2012  . Constipation 05/23/2012  . Transaminitis 05/23/2012  . CELLULITIS, RIGHT LEG 07/24/2009  . RHINITIS 04/10/2009  . Migraine 02/09/2007  . TONSILLITIS, RECURRENT 02/09/2007  . COMMON MIGRAINE 10/20/2006   Past Medical History:  Diagnosis Date  . Migraines    Past Surgical History:  Procedure Laterality Date  . CHOLECYSTECTOMY N/A 05/26/2012   Procedure: LAPAROSCOPIC CHOLECYSTECTOMY WITH INTRAOPERATIVE CHOLANGIOGRAM;  Surgeon: Currie Paris, MD;  Location: [facility] ORS;  Service: General;  Laterality: N/A;  . ERCP N/A 05/25/2012   Procedure: ENDOSCOPIC RETROGRADE CHOLANGIOPANCREATOGRAPHY (ERCP);  Surgeon: Willis Modena, MD;  Location[facility] OR;  Service: Endoscopy;  Laterality: N/A;  . EYE SURGERY     Allergies  Allergen Reactions  . Codeine     REACTION: nausea Can take codeine with phenergan   Prior to Admission medications   Medication Sig Start Date End Date Taking? Authorizing Provider  Galcanezumab-gnlm (EMGALITY) 120 MG/ML SOAJ Emgality Pen 120 mg/mL subcutaneous pen injector  INJECT CONTENTS OF TWO 120MG  PENS UNDER THE SKIN AT ONCE 01/17/17  Yes [provider]  hydrOXYzine (ATARAX/VISTARIL) 25 MG tablet Take by mouth. 08/23/17 08/24/18 Yes [provider]  ibuprofen (ADVIL,MOTRIN) 600 MG tablet 800 mg. 05/21/15  Yes [provider]  levonorgestrel (MIRENA) 20 MCG/24HR IUD 1 each by Intrauterine route once. June 2016   Yes [provider]  promethazine (PHENERGAN) 25 MG tablet Take 25 mg by mouth every 6 (six) hours as needed for nausea or vomiting.   Yes [provider]  rizatriptan (MAXALT) 10 MG tablet Take by mouth. 08/15/17 08/16/18 Yes [provider]  topiramate (TOPAMAX) 100 MG tablet Take 100 mg by mouth 2 (two) times daily.   Yes [provider]   Social History   Socioeconomic History  . Marital status: Married    Spouse name: Not on file  . Number of children: Not on  file  . Years of education: Not on file  . Highest education level: Not on file  Occupational History  . Not on file  Social Needs  . Financial resource strain: Not on file  . Food insecurity:    Worry: Not on file    Inability: Not on file  . Transportation needs:    Medical: Not on file    Non-medical: Not on file  Tobacco Use  . Smoking status: Never Smoker  . Smokeless tobacco: Never Used  Substance and Sexual Activity  . Alcohol use: Yes    Alcohol/week: 0.0 standard drinks  . Drug use: No  . Sexual activity: Yes  Lifestyle  . Physical activity:    Days per week: Not on file    Minutes per session: Not on file  . Stress: Not on file  Relationships  . Social connections:    Talks on phone: Not on file    Gets together: Not on file    Attends religious service: Not on file    Active member of club or organization: Not on file    Attends meetings of clubs or organizations: Not on file    Relationship status: Not on file  . Intimate partner violence:    Fear of current or ex partner: Not on file    Emotionally abused: Not on file    Physically abused: Not on file    Forced sexual activity: Not on file  Other Topics Concern  . Not on file  Social History Narrative  . Not on file   Review of Systems  Constitutional: Positive for chills, fatigue and fever. Negative for unexpected weight change.  HENT: Negative for congestion, rhinorrhea and sore throat.   Respiratory: Negative for cough.   Gastrointestinal: Negative for constipation, diarrhea, nausea and vomiting.  Genitourinary: Negative for dysuria, frequency and urgency.  Skin: Negative for rash and wound.  Neurological: Positive for headaches. Negative for dizziness and weakness.       Objective:   Physical Exam  Constitutional: She is oriented to person, place, and time. She appears well-developed and well-nourished. No distress.  HENT:  Head: Normocephalic and atraumatic.  Eyes: Pupils are equal, round,  and reactive to light. EOM are normal.  Neck: Neck supple.  Cardiovascular: Regular rhythm. Tachycardia present.  No murmur heard. Pulmonary/Chest: Effort normal and breath sounds normal. No respiratory distress.  Abdominal: There is tenderness in the suprapubic area. There is CVA tenderness.  Slight right CVA tenderness but diffusely tender throughout her back  Musculoskeletal: Normal range of motion.  Neurological: She is alert and oriented to person, place, and time.  Skin: Skin is warm and dry.  Psychiatric: She has a normal mood and affect. Her behavior is normal.  Nursing note and vitals reviewed.   Vitals:   10/24/17 1323  BP: 120/80  Pulse: (!) 114  Resp: 18  Temp: (!) 102.3 F (39.1 C)  TempSrc: Oral  SpO2: 99%  Weight: 177 lb (80.3 kg)  Height: 1' (0.305 m)   Results for orders placed or performed in visit on 10/24/17  POCT Influenza A/B  Result  Value Ref Range   Influenza A, POC Negative Negative   Influenza B, POC Negative Negative  POCT urinalysis dipstick  Result Value Ref Range   Color, UA yellow yellow   Clarity, UA clear clear   Glucose, UA negative negative mg/dL   Bilirubin, UA negative negative   Ketones, POC UA large (80) (A) negative mg/dL   Spec Grav, UA 1.610 9.604 - 1.025   Blood, UA negative negative   pH, UA 5.5 5.0 - 8.0   Protein Ur, POC =30 (A) negative mg/dL   Urobilinogen, UA 0.2 0.2 or 1.0 E.U./dL   Nitrite, UA Negative Negative   Leukocytes, UA Negative Negative  POCT CBC  Result Value Ref Range   WBC 6.5 4.6 - 10.2 K/uL   Lymph, poc 1.0 0.6 - 3.4   POC LYMPH PERCENT 15.2 10 - 50 %L   MID (cbc) 0.4 0 - 0.9   POC MID % 6.0 0 - 12 %M   POC Granulocyte 5.1 2 - 6.9   Granulocyte percent 78.8 37 - 80 %G   RBC 4.62 4.04 - 5.48 M/uL   Hemoglobin 12.5 9.5 - 13.5 g/dL   HCT, POC 54.0 29 - 41 %   MCV 79.7 76 - 111 fL   MCH, POC 27.0 27 - 31.2 pg   MCHC 33.9 31.8 - 35.4 g/dL   RDW, POC 98.1 %   Platelet Count, POC 174 142 - 424 K/uL     MPV 6.8 0 - 99.8 fL  POCT Microscopic Urinalysis ([facility])  Result Value Ref Range   WBC,UR,HPF,POC Few (A) None WBC/hpf   RBC,UR,HPF,POC None None RBC/hpf   Bacteria Few (A) None, Too numerous to count   Mucus Absent Absent   Epithelial Cells, UR Per Microscopy None None, Too numerous to count cells/hpf       Assessment & Plan:    Mariah Ferrell is a 37 y.o. female Fever, unspecified - Plan: POCT Influenza A/B, POCT urinalysis dipstick, POCT CBC, POCT Microscopic Urinalysis ([facility])  Flu-like symptoms - Plan: POCT Influenza A/B, oseltamivir (TAMIFLU) 75 MG capsule  Suprapubic abdominal pain - Plan: POCT urinalysis dipstick, POCT CBC, POCT Microscopic Urinalysis ([facility]), Urine Culture   Although no cough or other respiratory symptoms, otherwise appears consistent with influenza/flulike illness.  Based on timing of fevers and myalgias, option of Tamiflu discussed as well as potential side effects/risk.    - Will start Tamiflu 75 mg twice daily, push fluids, rest, symptomatic care and antipyretics discussed.  -Overall reassuring urinalysis, will check urine culture.   -Out of work note provided, RTC precautions discussed.  Meds ordered this encounter  Medications  . oseltamivir (TAMIFLU) 75 MG capsule    Sig: Take 1 capsule (75 mg total) by mouth 2 (two) times daily.    Dispense:  10 capsule    Refill:  0   Patient Instructions    Although the flu test was negative, this still appears to be due to influenza or flu-like illness.  Blood counts appeared normal, urine test did not appear to have infection but I will check a urine culture.  Can start Tamiflu as discussed. Saline nasal spray at least 4 times per day if needed for nasal congestion, over the counter mucinex or mucinex DM as needed for cough, tylenol or ibuprofen over the counter for fever and body aches, and drink plenty of fluids. Other information as in instructions below.  Return to the clinic or go to the nearest  emergency room  if any of your symptoms worsen or new symptoms occur.  You should avoid returning to work until you have been fever free for 48 hours.  Influenza, Adult Influenza, more commonly known as "the flu," is a viral infection that primarily affects the respiratory tract. The respiratory tract includes organs that help you breathe, such as the lungs, nose, and throat. The flu causes many common cold symptoms, as well as a high fever and body aches. The flu spreads easily from person to person (is contagious). Getting a flu shot (influenza vaccination) every year is the best way to prevent influenza. What are the causes? Influenza is caused by a virus. You can catch the virus by:  Breathing in droplets from an infected person's cough or sneeze.  Touching something that was recently contaminated with the virus and then touching your mouth, nose, or eyes.  What increases the risk? The following factors may make you more likely to get the flu:  Not cleaning your hands frequently with soap and water or alcohol-based hand sanitizer.  Having close contact with many people during cold and flu season.  Touching your mouth, eyes, or nose without washing or sanitizing your hands first.  Not drinking enough fluids or not eating a healthy diet.  Not getting enough sleep or exercise.  Being under a high amount of stress.  Not getting a yearly (annual) flu shot.  You may be at a higher risk of complications from the flu, such as a severe lung infection (pneumonia), if you:  Are over the age of 48.  Are pregnant.  Have a weakened disease-fighting system (immune system). You may have a weakened immune system if you: ? Have HIV or AIDS. ? Are undergoing chemotherapy. ? Aretaking medicines that reduce the activity of (suppress) the immune system.  Have a long-term (chronic) illness, such as heart disease, kidney disease, diabetes, or lung disease.  Have a liver disorder.  Are  obese.  Have anemia.  What are the signs or symptoms? Symptoms of this condition typically last 4-10 days and may include:  Fever.  Chills.  Headache, body aches, or muscle aches.  Sore throat.  Cough.  Runny or congested nose.  Chest discomfort and cough.  Poor appetite.  Weakness or tiredness (fatigue).  Dizziness.  Nausea or vomiting.  How is this diagnosed? This condition may be diagnosed based on your medical history and a physical exam. Your health care provider may do a nose or throat swab test to confirm the diagnosis. How is this treated? If influenza is detected early, you can be treated with antiviral medicine that can reduce the length of your illness and the severity of your symptoms. This medicine may be given by mouth (orally) or through an IV tube that is inserted in one of your veins. The goal of treatment is to relieve symptoms by taking care of yourself at home. This may include taking over-the-counter medicines, drinking plenty of fluids, and adding humidity to the air in your home. In some cases, influenza goes away on its own. Severe influenza or complications from influenza may be treated in a hospital. Follow these instructions at home:  Take over-the-counter and prescription medicines only as told by your health care provider.  Use a cool mist humidifier to add humidity to the air in your home. This can make breathing easier.  Rest as needed.  Drink enough fluid to keep your urine clear or pale yellow.  Cover your mouth and nose when you cough  or sneeze.  Wash your hands with soap and water often, especially after you cough or sneeze. If soap and water are not available, use hand sanitizer.  Stay home from work or school as told by your health care provider. Unless you are visiting your health care provider, try to avoid leaving home until your fever has been gone for 24 hours without the use of medicine.  Keep all follow-up visits as told  by your health care provider. This is important. How is this prevented?  Getting an annual flu shot is the best way to avoid getting the flu. You may get the flu shot in late summer, fall, or winter. Ask your health care provider when you should get your flu shot.  Wash your hands often or use hand sanitizer often.  Avoid contact with people who are sick during cold and flu season.  Eat a healthy diet, drink plenty of fluids, get enough sleep, and exercise regularly. Contact a health care provider if:  You develop new symptoms.  You have: ? Chest pain. ? Diarrhea. ? A fever.  Your cough gets worse.  You produce more mucus.  You feel nauseous or you vomit. Get help right away if:  You develop shortness of breath or difficulty breathing.  Your skin or nails turn a bluish color.  You have severe pain or stiffness in your neck.  You develop a sudden headache or sudden pain in your face or ear.  You cannot stop vomiting. This information is not intended to replace advice given to you by your health care provider. Make sure you discuss any questions you have with your health care provider. Document Released: 12/19/1999 Document Revised: 05/29/2015 Document Reviewed: 10/15/2014 Elsevier Interactive Patient Education  [employer].    If you have lab work done today you will be contacted with your lab results within the next 2 weeks.  If you have not heard from [place] then please contact us. The fastest way to get your results is to register for My Chart.   IF you received an x-ray today, you will receive an invoice from [facility]. Please contact [facility] at [phone] with questions or concerns regarding your invoice.   IF you received labwork today, you will receive an invoice from [facility]. Please contact [facility] at [phone] with questions or concerns regarding your invoice.   Our billing staff will not be able to assist you with questions  regarding bills from these companies.  You will be contacted with the lab results as soon as they are available. The fastest way to get your results is to activate your My Chart account. Instructions are located on the last page of this paperwork. If you have not heard from [place] regarding the results in 2 weeks, please contact this office.       I personally performed the services described in this documentation, which was scribed in my presence. The recorded information has been reviewed and considered for accuracy and completeness, addended by me as needed, and agree with information above.  Signed,   Meredith Staggers, MD Primary Care at [facility].  10/24/17 3:01 PM

## 2017-10-25 LAB — URINE CULTURE: Organism ID, Bacteria: NO GROWTH

## 2017-10-31 ENCOUNTER — Ambulatory Visit: Payer: Self-pay

## 2017-10-31 ENCOUNTER — Encounter: Payer: Self-pay | Admitting: Family Medicine

## 2017-10-31 ENCOUNTER — Ambulatory Visit (INDEPENDENT_AMBULATORY_CARE_PROVIDER_SITE_OTHER): Payer: BLUE CROSS/BLUE SHIELD | Admitting: Family Medicine

## 2017-10-31 ENCOUNTER — Other Ambulatory Visit: Payer: Self-pay

## 2017-10-31 VITALS — BP 131/88 | HR 60 | Ht 63.0 in | Wt 178.0 lb

## 2017-10-31 DIAGNOSIS — G43709 Chronic migraine without aura, not intractable, without status migrainosus: Secondary | ICD-10-CM | POA: Diagnosis not present

## 2017-10-31 MED ORDER — KETOROLAC TROMETHAMINE 60 MG/2ML IM SOLN
60.0000 mg | Freq: Once | INTRAMUSCULAR | Status: AC
  Administered 2017-10-31: 60 mg via INTRAMUSCULAR

## 2017-10-31 MED ORDER — PROMETHAZINE HCL 25 MG RE SUPP: 25.0000 mg | Freq: Four times a day (QID) | RECTAL | 0 refills | Status: DC | PRN

## 2017-10-31 MED ORDER — PROMETHAZINE HCL 25 MG/ML IJ SOLN
25.0000 mg | Freq: Once | INTRAMUSCULAR | Status: AC
  Administered 2017-10-31: 25 mg via INTRAMUSCULAR

## 2017-10-31 NOTE — Progress Notes (Signed)
10/28/20191:25 PM  Mariah Ferrell Jul 23, 1980, 37 y.o. female [id number]  Chief Complaint  Patient presents with  . Migraine    MIigraine with nausea onset this morning, took pherergan for the nausea with no relief    HPI:   Patient is a 37 y.o. female with past medical history significant for migraines who presents today for migraine  Feels like "someone has taken a screwdriver and trying to take out her right eye" Migraine started this morning Came in quickly and intense Normally has more of warning, time to respond No aura + photophobia, phonophobia  Took phenegran 25mg  this morning and nothing else as she was so nauseous Last vomiting just before getting here  Last visit with neuro was in aug 2019 On emgality, topamax, hydroxyzine and maxalt  Has someone to drive her  zofran gives her headahces  Sees neuro in november  Patient Care Team: Tysinger, Cleda Mccreedy as PCP - General (Family Medicine) Dr Glade Nurse - neurology  Fall Risk  10/31/2017 10/24/2017 10/24/2017 01/26/2017 05/21/2015  Falls in the past year? No No No No No     Depression screen [facility] 2/9 10/31/2017 10/24/2017 10/24/2017  Decreased Interest 0 0 0  Down, Depressed, Hopeless 0 0 0  PHQ - 2 Score 0 0 0    Allergies  Allergen Reactions  . Codeine     REACTION: nausea Can take codeine with phenergan    Prior to Admission medications   Medication Sig Start Date End Date Taking? Authorizing Provider  Galcanezumab-gnlm (EMGALITY) 120 MG/ML SOAJ Emgality Pen 120 mg/mL subcutaneous pen injector  INJECT CONTENTS OF TWO 120MG  PENS UNDER THE SKIN AT ONCE 01/17/17   [provider]  hydrOXYzine (ATARAX/VISTARIL) 25 MG tablet Take by mouth. 08/23/17 08/24/18  [provider]  ibuprofen (ADVIL,MOTRIN) 600 MG tablet 800 mg. 05/21/15   [provider]  levonorgestrel (MIRENA) 20 MCG/24HR IUD 1 each by Intrauterine route once. June 2016    [provider]    promethazine (PHENERGAN) 25 MG tablet Take 25 mg by mouth every 6 (six) hours as needed for nausea or vomiting.    [provider]  rizatriptan (MAXALT) 10 MG tablet Take by mouth. 08/15/17 08/16/18  [provider]  topiramate (TOPAMAX) 100 MG tablet Take 100 mg by mouth 2 (two) times daily.    [provider]    Past Medical History:  Diagnosis Date  . Migraines     Past Surgical History:  Procedure Laterality Date  . CHOLECYSTECTOMY N/A 05/26/2012   Procedure: LAPAROSCOPIC CHOLECYSTECTOMY WITH INTRAOPERATIVE CHOLANGIOGRAM;  Surgeon: Currie Paris, MD;  Location: [facility];  Service: General;  Laterality: N/A;  . ERCP N/A 05/25/2012   Procedure: ENDOSCOPIC RETROGRADE CHOLANGIOPANCREATOGRAPHY (ERCP);  Surgeon: Willis Modena, MD;  Location: [facility] OR;  Service: Endoscopy;  Laterality: N/A;  . EYE SURGERY      Social History   Tobacco Use  . Smoking status: Never Smoker  . Smokeless tobacco: Never Used  Substance Use Topics  . Alcohol use: Yes    Alcohol/week: 0.0 standard drinks    Family History  Problem Relation Age of Onset  . Hypertension Mother     ROS Per hpi  OBJECTIVE:  Blood pressure 131/88, pulse 60, height 5\' 3"  (1.6 m), weight 178 lb (80.7 kg), SpO2 100 %. Body mass index is 31.53 kg/m.   Physical Exam  Constitutional: She is oriented to person, place, and time. She appears well-developed and well-nourished.  lying on  exam bed with eye closed, basin by her side  HENT:  Head: Normocephalic and atraumatic.  Mouth/Throat: Oropharynx is clear and moist. No oropharyngeal exudate.  Eyes: Pupils are equal, round, and reactive to light. Conjunctivae and EOM are normal. No scleral icterus.  Neck: Neck supple.  Cardiovascular: Normal rate, regular rhythm and normal heart sounds. Exam reveals no gallop and no friction rub.  No murmur heard. Pulmonary/Chest: Effort normal and breath sounds normal. She has no wheezes. She has no rales.   Musculoskeletal: She exhibits no edema.  Neurological: She is alert and oriented to person, place, and time.  Skin: Skin is warm and dry.  Psychiatric: She has a normal mood and affect.  Nursing note and vitals reviewed.    ASSESSMENT and PLAN  1. Chronic migraine without aura without status migrainosus, not intractable - promethazine (PHENERGAN) injection 25 mg - ketorolac (TORADOL) injection 60 mg  Return if symptoms worsen or fail to improve.    Myles Lipps, MD Primary Care at [facility] [address] [place], [place] [address] Ph.  [phone] Fax [phone]

## 2017-10-31 NOTE — Telephone Encounter (Signed)
Pt called with C/O severe migraine not responding to her current medications. Pt is nauseated and states it feels like a knife stabbing her right eye. She has Hx of migraines and usually say she can find a reason like weather. This one she is unsure of a cause. She denies head injury. Denies pregnancy. Appointment made per protocol. Care advice read to patient. Pt verbalized understanding of all instructions. Reason for Disposition . [1] SEVERE headache (e.g., excruciating) AND [2] not improved after 2 hours of pain medicine  Answer Assessment - Initial Assessment Questions 1. LOCATION: "Where does it hurt?"      Eye rt side 2. ONSET: "When did the headache start?" (Minutes, hours or days)      1 hour ago 3. PATTERN: "Does the pain come and go, or has it been constant since it started?"     constant 4. SEVERITY: "How bad is the pain?" and "What does it keep you from doing?"  (e.g., Scale 1-10; mild, moderate, or severe)   - MILD (1-3): doesn't interfere with normal activities    - MODERATE (4-7): interferes with normal activities or awakens from sleep    - SEVERE (8-10): excruciating pain, unable to do any normal activities        severe 5. RECURRENT SYMPTOM: "Have you ever had headaches before?" If so, ask: "When was the last time?" and "What happened that time?"      Yes migraines 6. CAUSE: "What do you think is causing the headache?"     nothing 7. MIGRAINE: "Have you been diagnosed with migraine headaches?" If so, ask: "Is this headache similar?"      yes 8. HEAD INJURY: "Has there been any recent injury to the head?"      no 9. OTHER SYMPTOMS: "Do you have any other symptoms?" (fever, stiff neck, eye pain, sore throat, cold symptoms)     nausea 10. PREGNANCY: "Is there any chance you are pregnant?" "When was your last menstrual period?"      No ( Mirena )  Protocols used: HEADACHE-A-AH

## 2017-10-31 NOTE — Patient Instructions (Signed)
° ° ° °  If you have lab work done today you will be contacted with your lab results within the next 2 weeks.  If you have not heard from us then please contact us. The fastest way to get your results is to register for My Chart. ° ° °IF you received an x-ray today, you will receive an invoice from [facility]. Please contact [facility] at [phone] with questions or concerns regarding your invoice.  ° °IF you received labwork today, you will receive an invoice from [facility]. Please contact [facility] at [phone] with questions or concerns regarding your invoice.  ° °Our billing staff will not be able to assist you with questions regarding bills from these companies. ° °You will be contacted with the lab results as soon as they are available. The fastest way to get your results is to activate your My Chart account. Instructions are located on the last page of this paperwork. If you have not heard from us regarding the results in 2 weeks, please contact this office. °  ° ° ° °

## 2017-11-19 ENCOUNTER — Encounter (HOSPITAL_COMMUNITY): Payer: Self-pay | Admitting: Emergency Medicine

## 2017-11-19 ENCOUNTER — Other Ambulatory Visit: Payer: Self-pay

## 2017-11-19 ENCOUNTER — Ambulatory Visit (HOSPITAL_COMMUNITY)
Admission: EM | Admit: 2017-11-19 | Discharge: 2017-11-19 | Disposition: A | Discharge status: Home / Self Care | Payer: BLUE CROSS/BLUE SHIELD | Attending: Radiology | Admitting: Radiology

## 2017-11-19 DIAGNOSIS — L0390 Cellulitis, unspecified: Secondary | ICD-10-CM

## 2017-11-19 MED ORDER — PREDNISONE 20 MG PO TABS: 40.0000 mg | ORAL_TABLET | Freq: Every day | ORAL | 0 refills | Status: AC

## 2017-11-19 MED ORDER — FLUCONAZOLE 200 MG PO TABS: 200.0000 mg | ORAL_TABLET | Freq: Every day | ORAL | 0 refills | Status: AC

## 2017-11-19 MED ORDER — BENZONATATE 100 MG PO CAPS: 100.0000 mg | ORAL_CAPSULE | Freq: Three times a day (TID) | ORAL | 0 refills | Status: DC

## 2017-11-19 MED ORDER — CEPHALEXIN 500 MG PO CAPS: 500.0000 mg | ORAL_CAPSULE | Freq: Four times a day (QID) | ORAL | 0 refills | Status: AC

## 2017-11-19 NOTE — ED Triage Notes (Signed)
[facility] with a complaint of a possible insect bite to her left lower leg that she noticed 2 days ago.

## 2017-11-19 NOTE — ED Provider Notes (Signed)
[facility]    CSN: [id number] Arrival date & time: 11/19/17  1454     History   Chief Complaint Chief Complaint  Patient presents with  . Insect Bite    HPI Mariah Ferrell is a 37 y.o. female.   37 year old female presents with rash to the medial aspect below her patella of her right leg x2 days.  Condition is acute and persistent in nature.  Condition is made better by nothing.  Condition is made worse by nothing.  Patient denies any treatment prior to arrival at this facility.  Patient denies any known trauma or insect bite rash appears vesicular in nature with erythema approximately 5 cm in diameter.  Patient has recently had an upper respiratory infection and reports some congestion with a residual cough that is nonproductive in nature cough is worse at night and while lying down.  Patient requests treatment for cough at this time.       Past Medical History:  Diagnosis Date  . Migraines     Patient Active Problem List   Diagnosis Date Noted  . Postoperative anemia due to acute blood loss 05/27/2012  . Choledocholithiasis with obstruction and epigastric pain status post ERCP/sphincterotomy and laparoscopic cholecystectomy 05/24/2012  . UTI (lower urinary tract infection) 05/23/2012  . Constipation 05/23/2012  . Transaminitis 05/23/2012  . CELLULITIS, RIGHT LEG 07/24/2009  . RHINITIS 04/10/2009  . Migraine 02/09/2007  . TONSILLITIS, RECURRENT 02/09/2007  . COMMON MIGRAINE 10/20/2006    Past Surgical History:  Procedure Laterality Date  . CHOLECYSTECTOMY N/A 05/26/2012   Procedure: LAPAROSCOPIC CHOLECYSTECTOMY WITH INTRAOPERATIVE CHOLANGIOGRAM;  Surgeon: Currie Paris, MD;  Location: [facility];  Service: General;  Laterality: N/A;  . ERCP N/A 05/25/2012   Procedure: ENDOSCOPIC RETROGRADE CHOLANGIOPANCREATOGRAPHY (ERCP);  Surgeon: Willis Modena, MD;  Location: [facility] OR;  Service: Endoscopy;  Laterality: N/A;  . EYE SURGERY      OB History   None       Home Medications    Prior to Admission medications   Medication Sig Start Date End Date Taking? Authorizing Provider  Galcanezumab-gnlm (EMGALITY) 120 MG/ML SOAJ Emgality Pen 120 mg/mL subcutaneous pen injector  INJECT CONTENTS OF TWO 120MG  PENS UNDER THE SKIN AT ONCE 01/17/17   [provider]  hydrOXYzine (ATARAX/VISTARIL) 25 MG tablet Take by mouth. 08/23/17 08/24/18  [provider]  ibuprofen (ADVIL,MOTRIN) 600 MG tablet 800 mg. 05/21/15   [provider]  levonorgestrel (MIRENA) 20 MCG/24HR IUD 1 each by Intrauterine route once. June 2016    [provider]  promethazine (PHENERGAN) 25 MG suppository Place 1 suppository (25 mg total) rectally every 6 (six) hours as needed for nausea or vomiting. 10/31/17   Myles Lipps, MD  promethazine (PHENERGAN) 25 MG tablet Take 25 mg by mouth every 6 (six) hours as needed for nausea or vomiting.    [provider]  rizatriptan (MAXALT) 10 MG tablet Take by mouth. 08/15/17 08/16/18  [provider]  topiramate (TOPAMAX) 100 MG tablet Take 100 mg by mouth 2 (two) times daily.    [provider]    Family History Family History  Problem Relation Age of Onset  . Hypertension Mother     Social History Social History   Tobacco Use  . Smoking status: Never Smoker  . Smokeless tobacco: Never Used  Substance Use Topics  . Alcohol use: Yes    Alcohol/week: 0.0 standard drinks  . Drug use: No     Allergies  Codeine   Review of Systems Review of Systems  Constitutional: Negative for chills and fever.  HENT: Negative for ear pain and sore throat.   Eyes: Negative for pain and visual disturbance.  Respiratory: Positive for cough ( non productive). Negative for shortness of breath.   Cardiovascular: Negative for chest pain and palpitations.  Gastrointestinal: Negative for abdominal pain and vomiting.  Genitourinary: Negative for dysuria and hematuria.  Musculoskeletal:  Negative for arthralgias and back pain.  Skin: Positive for rash ( to right lower leg). Negative for color change.  Neurological: Negative for seizures and syncope.  All other systems reviewed and are negative.    Physical Exam Triage Vital Signs ED Triage Vitals [11/19/17 1513]  Enc Vitals Group     BP (!) 141/94     Pulse Rate 82     Resp 16     Temp 98.7 F (37.1 C)     Temp Source Oral     SpO2 100 %     Weight      Height      Head Circumference      Peak Flow      Pain Score 1     Pain Loc      Pain Edu?      Excl. in GC?    No data found.  Updated Vital Signs BP (!) 141/94 (BP Location: Left Arm)   Pulse 82   Temp 98.7 F (37.1 C) (Oral)   Resp 16   SpO2 100%   Visual Acuity Right Eye Distance:   Left Eye Distance:   Bilateral Distance:    Right Eye Near:   Left Eye Near:    Bilateral Near:     Physical Exam  Constitutional: She is oriented to person, place, and time. She appears well-developed and well-nourished.  HENT:  Head: Normocephalic and atraumatic.  Eyes: Conjunctivae are normal.  Neck: Normal range of motion.  Cardiovascular: Normal rate and regular rhythm.  Pulmonary/Chest: Effort normal and breath sounds normal.  Musculoskeletal: Normal range of motion.  Neurological: She is alert and oriented to person, place, and time.  Skin: Skin is warm. Rash ( approximalely 5 cm vesiclar with erythema and warmth ) noted.  Psychiatric: She has a normal mood and affect.  Nursing note and vitals reviewed.    UC Treatments / Results  Labs (all labs ordered are listed, but only abnormal results are displayed) Labs Reviewed - No data to display  EKG None  Radiology No results found.  Procedures Procedures (including critical care time)  Medications Ordered in UC Medications - No data to display  Initial Impression / Assessment and Plan / UC Course  I have reviewed the triage vital signs and the nursing notes.  Pertinent labs & imaging  results that were available during my care of the patient were reviewed by me and considered in my medical decision making (see chart for details).      Final Clinical Impressions(s) / UC Diagnoses   Final diagnoses:  None   Discharge Instructions   None    ED Prescriptions    None     Controlled Substance Prescriptions [place] Controlled Substance Registry consulted? Not Applicable   Alene MiresOmohundro, Dahir Ayer C, NP 11/19/17 1552

## 2017-11-21 ENCOUNTER — Encounter: Payer: Self-pay | Admitting: Medical

## 2017-11-21 ENCOUNTER — Ambulatory Visit (INDEPENDENT_AMBULATORY_CARE_PROVIDER_SITE_OTHER): Payer: BLUE CROSS/BLUE SHIELD | Admitting: Medical

## 2017-11-21 VITALS — BP 130/70 | HR 103 | Temp 98.4°F | Resp 16 | Ht 63.0 in | Wt 180.8 lb

## 2017-11-21 DIAGNOSIS — B09 Unspecified viral infection characterized by skin and mucous membrane lesions: Secondary | ICD-10-CM | POA: Diagnosis not present

## 2017-11-21 DIAGNOSIS — R238 Other skin changes: Secondary | ICD-10-CM

## 2017-11-21 MED ORDER — VALACYCLOVIR HCL 1 G PO TABS: 1000.0000 mg | ORAL_TABLET | Freq: Three times a day (TID) | ORAL | 2 refills | Status: DC

## 2017-11-21 NOTE — Progress Notes (Signed)
Subjective: Chief Complaint  Patient presents with  . rash    rash on right leg lower,   cough, congestion, drainage    Here with husband today.  Here today for cold symptoms and a rash of her right lower leg.   She denies prior similar rash.  The rash started as a red somewhat tender area that quickly developed into blisters within 24 hours and this progressively got worse over the weekend until now.  She has also had cold symptoms for the last several days including cough and congestion.  Denies current fever or body aches or chills but she notes she did have a collection of symptoms about a month ago including fever body aches chills and fatigue.  She denies sick contacts with similar rash.  She was seen 2 days ago over the weekend by urgent care.  There was concern for bacterial skin infection so she was put on Keflex and prednisone for possible spider bite.  Over the weekend after reviewing Internet search she is concerned about shingles.  Neither her nor her husband have a history of shingles or herpes.  No other aggravating or relieving factors. No other complaint.   Past Medical History:  Diagnosis Date  . Migraines    Current Outpatient Medications on File Prior to Visit  Medication Sig Dispense Refill  . benzonatate (TESSALON) 100 MG capsule Take 1 capsule (100 mg total) by mouth every 8 (eight) hours. 21 capsule 0  . cephALEXin (KEFLEX) 500 MG capsule Take 1 capsule (500 mg total) by mouth 4 (four) times daily for 5 days. 20 capsule 0  . cyclobenzaprine (FLEXERIL) 10 MG tablet Take 10 mg by mouth every 8 (eight) hours.    . Galcanezumab-gnlm (EMGALITY) 120 MG/ML SOAJ Emgality Pen 120 mg/mL subcutaneous pen injector  INJECT CONTENTS OF TWO 120MG  PENS UNDER THE SKIN AT ONCE    . ibuprofen (ADVIL,MOTRIN) 600 MG tablet 800 mg.    . levonorgestrel (MIRENA) 20 MCG/24HR IUD 1 each by Intrauterine route once. June 2016    . predniSONE (DELTASONE) 20 MG tablet Take 2 tablets (40 mg  total) by mouth daily with breakfast for 5 days. 10 tablet 0  . promethazine (PHENERGAN) 25 MG suppository Place 1 suppository (25 mg total) rectally every 6 (six) hours as needed for nausea or vomiting. 12 each 0  . rizatriptan (MAXALT) 10 MG tablet Take by mouth.    . topiramate (TOPAMAX) 100 MG tablet Take 100 mg by mouth 2 (two) times daily.    . fluconazole (DIFLUCAN) 200 MG tablet Take 1 tablet (200 mg total) by mouth daily for 2 days. (Patient not taking: Reported on 11/21/2017) 7 tablet 0   No current facility-administered medications on file prior to visit.      Objective: BP 130/70   Pulse (!) 103   Temp 98.4 F (36.9 C) (Oral)   Resp 16   Ht 5\' 3"  (1.6 m)   Wt 180 lb 12.8 oz (82 kg)   SpO2 98%   BMI 32.03 kg/m    General appearance: alert, no distress, WD/WN, African-American female HEENT: normocephalic, sclerae anicteric, TMs pearly, nares with mild turbinate edema, mucoid discharge, positive erythema, pharynx normal Oral cavity: MMM, no lesions Neck: supple, no lymphadenopathy, no thyromegaly, no masses Lungs: CTA bilaterally, no wheezes, rhonchi, or rales Right upper thigh anteriorly medially with round 2.5 cm diameter erythematous base with patch of No other rash noted vesicular lesions centrally Pulses: 2+ symmetric, upper and lower extremities, normal  cap refill    Assessment: Encounter Diagnoses  Name Primary?  . Viral rash Yes  . Vesicular rash       Plan: Discussed the symptoms, exam findings, suggestive of herpetic lesion versus small area of shingles outbreak.  discussed transmission, precautions to prevent spread to others, particularly high risk groups as discussed including young children, elderly, immunocompromised people, or pregnant women.  Discussed treatment including relative rest, hydration, can use OTC Cortaid topically but don't let others use the cream and discard the cream after this episode of viral rash..   Begin Valtrex as below, she  can continue the current prednisone she is taking from the urgent care.  Advise she stop Keflex at this point.  She declines viral culture.  After visit summary given.   Eltha was seen today for rash.  Diagnoses and all orders for this visit:  Viral rash  Vesicular rash  Other orders -     valACYclovir (VALTREX) 1000 MG tablet; Take 1 tablet (1,000 mg total) by mouth 3 (three) times daily.

## 2017-12-06 ENCOUNTER — Telehealth: Payer: Self-pay | Admitting: Medical

## 2017-12-06 NOTE — Telephone Encounter (Signed)
She sent me an e-mail.   Lets get her back in to discuss weight, appetite and concerns as that is not an easy answer over the phone or email

## 2017-12-07 NOTE — Telephone Encounter (Signed)
Called and left voicemail about appt

## 2017-12-15 ENCOUNTER — Ambulatory Visit: Payer: BLUE CROSS/BLUE SHIELD | Admitting: Family Medicine

## 2017-12-15 VITALS — BP 120/82 | HR 88 | Temp 97.9°F | Wt 181.0 lb

## 2017-12-15 DIAGNOSIS — R238 Other skin changes: Secondary | ICD-10-CM

## 2017-12-15 MED ORDER — DOXYCYCLINE HYCLATE 100 MG PO TABS: 100.0000 mg | ORAL_TABLET | Freq: Two times a day (BID) | ORAL | 0 refills | Status: DC

## 2017-12-15 NOTE — Progress Notes (Signed)
   Subjective:    Patient ID: Mariah Ferrell, female    DOB: January 05, 1981, 37 y.o.   MRN: [id number]  HPI She is here for evaluation of lesion on her left posterior thigh.  She had a similar episode prior to this and was evaluated by Vincenza HewsShane.  There was a question whether this was viral in nature.   Review of Systems     Objective:   Physical Exam A 3 to 4 cm erythematous early fascicular type lesion is noted on the thigh.       Assessment & Plan:  Vesicular rash - Plan: doxycycline (VIBRA-TABS) 100 MG tablet, WOUND CULTURE Attempted to get a culture from this to get a better feeling for what is going on.  Do not feel that this is viral in nature as it has occurred in a different area from her previous lesion.  She has no nerve root type symptoms with this either.  Follow-up pending blood results.

## 2017-12-16 ENCOUNTER — Other Ambulatory Visit: Payer: Self-pay | Admitting: Medical

## 2017-12-16 MED ORDER — TRIAMCINOLONE ACETONIDE 0.1 % EX CREA: 1.0000 "application " | TOPICAL_CREAM | Freq: Two times a day (BID) | CUTANEOUS | 0 refills | Status: DC

## 2017-12-20 LAB — WOUND CULTURE

## 2017-12-23 ENCOUNTER — Institutional Professional Consult (permissible substitution): Payer: BLUE CROSS/BLUE SHIELD | Admitting: Medical

## 2017-12-23 ENCOUNTER — Ambulatory Visit: Payer: BLUE CROSS/BLUE SHIELD | Admitting: Medical

## 2017-12-23 ENCOUNTER — Encounter: Payer: Self-pay | Admitting: Medical

## 2017-12-23 VITALS — BP 120/76 | HR 87 | Temp 98.1°F | Resp 16 | Ht 63.0 in | Wt 183.6 lb

## 2017-12-23 DIAGNOSIS — K9049 Malabsorption due to intolerance, not elsewhere classified: Secondary | ICD-10-CM

## 2017-12-23 DIAGNOSIS — L989 Disorder of the skin and subcutaneous tissue, unspecified: Secondary | ICD-10-CM

## 2017-12-23 DIAGNOSIS — Z832 Family history of diseases of the blood and blood-forming organs and certain disorders involving the immune mechanism: Secondary | ICD-10-CM

## 2017-12-23 DIAGNOSIS — K5900 Constipation, unspecified: Secondary | ICD-10-CM

## 2017-12-23 DIAGNOSIS — R21 Rash and other nonspecific skin eruption: Secondary | ICD-10-CM

## 2017-12-23 DIAGNOSIS — G43809 Other migraine, not intractable, without status migrainosus: Secondary | ICD-10-CM

## 2017-12-23 DIAGNOSIS — Z1589 Genetic susceptibility to other disease: Secondary | ICD-10-CM

## 2017-12-23 DIAGNOSIS — Z79899 Other long term (current) drug therapy: Secondary | ICD-10-CM

## 2017-12-23 DIAGNOSIS — Z136 Encounter for screening for cardiovascular disorders: Secondary | ICD-10-CM

## 2017-12-23 MED ORDER — PHENTERMINE HCL 37.5 MG PO TABS: 37.5000 mg | ORAL_TABLET | Freq: Every day | ORAL | 0 refills | Status: DC

## 2017-12-23 NOTE — Progress Notes (Signed)
Subjective: Chief Complaint  Patient presents with  . mirgraines    migraines, foot triggers    Here for several concerns  Medical team includes Dr. Olevia Bowens, neurology at [place], PA-C here for primary care  She has a history of migraines.  She currently is on preventative medications topiramate 100 mg daily and Emgality injection monthly.  She uses Flexeril or Maxalt at the onset of headaches.  Typically she gets 1 migraine weekly.  In the past foods including hot dogs, processed meats, refined cheeses, and no other foods set off her migraines.  But in the last 6 months these foods have been more intensive setting off her migraines and even, and cheeses and meats can set off her migraines.  Chocolate sets of her migraines.  She talk to her neurologist about being put on phentermine as an appetite suppressant as she notes strong cravings for sweets and chocolate.  Her neurologist was okay with this but asked that she get this through primary care.  She was seen recently twice by me and Dr. Redmond School here about a rash that was thought initially to be shingles but did not respond to Valtrex, but when she had a similar rash pop up on the other leg she was treated with doxycycline for possible bacterial infection.  Wound culture was not a viable sample.  She reports that she is exercising.  She would like lab work today to further evaluate her concerns and to screen for rheumatoid issue or lupus.  She has a family member with lupus  She reports seeing a rheumatologist recently that advised her that her joint pains are arthritis and not rheumatoid arthritis but no lab work was done.  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  She has constipation problems.  So she wonders about lupus and she has multiple symptoms  Past Medical History:  Diagnosis Date  . Migraines    Family History  Problem Relation Age of Onset  . Hypertension Mother   . Diabetes Mother    prediabetes  . Arthritis Mother   . Benign prostatic hyperplasia Father   . Depression Sister   . Polycystic ovary syndrome Sister   . Diabetes Sister      Current Outpatient Medications on File Prior to Visit  Medication Sig Dispense Refill  . cyclobenzaprine (FLEXERIL) 10 MG tablet Take 10 mg by mouth every 8 (eight) hours.    . Galcanezumab-gnlm (EMGALITY) 120 MG/ML SOAJ Emgality Pen 120 mg/mL subcutaneous pen injector  INJECT CONTENTS OF TWO 120MG PENS UNDER THE SKIN AT ONCE    . ibuprofen (ADVIL,MOTRIN) 600 MG tablet 800 mg.    . levonorgestrel (MIRENA) 20 MCG/24HR IUD 1 each by Intrauterine route once. June 2016    . promethazine (PHENERGAN) 25 MG suppository Place 1 suppository (25 mg total) rectally every 6 (six) hours as needed for nausea or vomiting. 12 each 0  . rizatriptan (MAXALT) 10 MG tablet Take by mouth.    . topiramate (TOPAMAX) 100 MG tablet Take 150 mg by mouth at bedtime.     . triamcinolone cream (KENALOG) 0.1 % Apply 1 application topically 2 (two) times daily. 30 g 0  . valACYclovir (VALTREX) 1000 MG tablet Take 1 tablet (1,000 mg total) by mouth 3 (three) times daily. (Patient not taking: Reported on 12/23/2017) 21 tablet 2   No current facility-administered medications on file prior to visit.    ROS as in subjective    Objective BP 120/76  Pulse 87   Temp 98.1 F (36.7 C) (Oral)   Resp 16   Ht '5\' 3"'  (1.6 m)   Wt 183 lb 9.6 oz (83.3 kg)   SpO2 99%   BMI 32.52 kg/m    Wt Readings from Last 3 Encounters:  12/23/17 183 lb 9.6 oz (83.3 kg)  12/15/17 181 lb (82.1 kg)  11/21/17 180 lb 12.8 oz (82 kg)   Gen: wd, wn, nad Skin: Left posterior lateral upper thigh with round 2 cm patch of healing lesions that had somewhat of a grouped vesicles or pustules.  There is a similar healing rash on the right medial mid thigh.  No other new rash.  Exam chaperoned by nurse  EKG today done due to starting phentermine and heart disease risk evaluation 76 bpm rate,  PR interval 150 ms, QRS duration 80 ms, QTC 456 ms, axis -9 degrees, normal sinus rhythm, no other abnormality.     Assessment: Encounter Diagnoses  Name Primary?  . Other migraine without status migrainosus, not intractable Yes  . Rash   . Skin lesion   . Family history of autoimmune disorder   . Food intolerance   . HLA B27 (HLA B27 positive)   . Constipation, unspecified constipation type   . Screening for heart disease   . High risk medication use     Plan: We discussed her concerns, we discussed her request for phentermine appetite suppressant.  Her migraines have several triggers including animal products and sugar.  I recommend she go vegetarian and gave diet recommendations and after visit summary with these recommendations.  Advise she cut out high sugar and animal products in general.  We discussed the risk and benefits of phentermine, and she reportedly has been on this in the remote past.  EKG reviewed.  She will start trial of phentermine to help with appetite suppression and cravings.  Labs today to further evaluate her several concerns.  Regarding the skin lesion, I reviewed the recent culture but there was no viable sample at the lab.  The swab was not sent in the culture medium according to the lab.  Currently the rash on both thighs is mostly healed so likely no sample to swab today.  When I originally saw her for the rash several weeks ago the concern was shingles based on a vesicular appearance but when she had rash appeared on the opposite leg she was treated with doxycycline which mostly cleared up the rash.  At this point if the rash recurs she can come in for bowel culture or possible biopsy.  Spent > 45 minutes face to face with patient in discussion of symptoms, evaluation, plan and recommendations.    Follow-up pending labs  Annelies was seen today for mirgraines.  Diagnoses and all orders for this visit:  Other migraine without status migrainosus, not  intractable -     Comprehensive metabolic panel -     CBC with Differential/Platelet -     TSH -     Sedimentation rate -     EKG 12-Lead -     ANA+ENA+DNA/DS+Antich+Centr  Rash -     Comprehensive metabolic panel -     CBC with Differential/Platelet -     TSH -     Sedimentation rate -     EKG 12-Lead -     ANA+ENA+DNA/DS+Antich+Centr  Skin lesion -     Comprehensive metabolic panel -     CBC with Differential/Platelet -  TSH -     Sedimentation rate -     EKG 12-Lead -     ANA+ENA+DNA/DS+Antich+Centr  Family history of autoimmune disorder -     Comprehensive metabolic panel -     CBC with Differential/Platelet -     TSH -     Sedimentation rate -     EKG 12-Lead -     ANA+ENA+DNA/DS+Antich+Centr  Food intolerance -     Comprehensive metabolic panel -     CBC with Differential/Platelet -     TSH -     Sedimentation rate -     EKG 12-Lead -     ANA+ENA+DNA/DS+Antich+Centr  HLA B27 (HLA B27 positive) -     Comprehensive metabolic panel -     CBC with Differential/Platelet -     TSH -     Sedimentation rate -     EKG 12-Lead -     ANA+ENA+DNA/DS+Antich+Centr  Constipation, unspecified constipation type -     Comprehensive metabolic panel -     CBC with Differential/Platelet -     TSH -     Sedimentation rate -     EKG 12-Lead -     ANA+ENA+DNA/DS+Antich+Centr  Screening for heart disease  High risk medication use  Other orders -     phentermine (ADIPEX-P) 37.5 MG tablet; Take 1 tablet (37.5 mg total) by mouth daily before breakfast.

## 2017-12-23 NOTE — Patient Instructions (Signed)
Lets change strategies and try something that may work better than what you are currently doing  I want you to eat 3 meals a day +2 snacks, one midmorning snack and one mid afternoon snack  Breakfast You may eat 1 of the following  Smoothie with Almond milk, handful of kale or spinach, and 1-2 fruit servings of your choice such as berries or 1/2 banana  Whole grain slice of toast and thin layer of low sugar jam or small amount of honey  Whole grain slice of toast and avocado spread  1/2 cup of steel cut oats (oatmeal)   Mid-morning snack 1 fruit serving such as one of the following:  medium-sized apple  medium-sized orange,  Tangerine  1/2 banana   3/4 cup of fresh berries or frozen berries  A protein source such as one of the following:  8 almonds   small handful of walnuts or other nuts  Hummus and vegetable such as carrots   Lunch A protein source such as 1 of the following: . 1 serving of beans such as black beans, pinto beans, green beans, or edamame (soy beans) . Veggie burger  . Non breaded fish such as salmon or tuna, either baked, grilled, or broiled Vegetable - Half of your plate should be a non-starchy vegetables!  So avoid white potatoes and corn.  Otherwise, eat a large portion of vegetables. . Avocado, cucumber, tomato, carrots, greens, lettuce, squash, okra, etc.  . Vegetables can include salad with olive oil/vinaigrette dressing Grains such as 1/2 cup of brown rice, quinoa, barley or other whole grain or 1 or 2 slices of whole grain bread   Mid-afternoon snack 1 fruit serving such as one of the following:  medium-sized apple  medium-sized orange,  Tangerine  1/2 banana   3/4 cup of fresh berries or frozen berries  A protein source such as one of the following:  8 almonds   small handful of walnuts or other nuts  Hummus and vegetable such as carrots   Dinner A protein source such as 1 of the following: . 1 serving of beans such as  black beans, pinto beans, green beans, or edamame (soy beans) . Veggie burger  . Non breaded fish such as salmon or tuna, either baked, grilled, or broiled Vegetable - Half of your plate should be a non-starchy vegetables!  So avoid white potatoes and corn.  Otherwise, eat a large portion of vegetables. . Avocado, cucumber, tomato, carrots, greens, lettuce, squash, okra, etc.  . Vegetables can include salad with olive oil/vinaigrette dressing Grains such as 1/2 cup of brown rice, quinoa, barley or other whole grain or 1 or 2 slices of whole grain bread   Beverages: Water Unsweet tea Home made juice with a juicer without sugar added other than small bit of honey or agave nectar Water with sugar free flavor such as [place]   AVOID.... For the time being I want you to cut out the following items completely: . Soda, sweet tea, juice, beer or wine or alcohol . Sweets such as cake, candy, pies, chips, cookies, chocolate  

## 2017-12-25 LAB — CBC WITH DIFFERENTIAL/PLATELET
BASOS: 0 %
Basophils Absolute: 0 10*3/uL (ref 0.0–0.2)
EOS (ABSOLUTE): 0.2 10*3/uL (ref 0.0–0.4)
Eos: 2 %
Hematocrit: 39 % (ref 34.0–46.6)
Hemoglobin: 12.5 g/dL (ref 11.1–15.9)
IMMATURE GRANULOCYTES: 0 %
Immature Grans (Abs): 0 10*3/uL (ref 0.0–0.1)
LYMPHS ABS: 2.6 10*3/uL (ref 0.7–3.1)
Lymphs: 39 %
MCH: 26.8 pg (ref 26.6–33.0)
MCHC: 32.1 g/dL (ref 31.5–35.7)
MCV: 84 fL (ref 79–97)
MONOS ABS: 0.5 10*3/uL (ref 0.1–0.9)
Monocytes: 7 %
NEUTROS PCT: 52 %
Neutrophils Absolute: 3.4 10*3/uL (ref 1.4–7.0)
PLATELETS: 270 10*3/uL (ref 150–450)
RBC: 4.66 x10E6/uL (ref 3.77–5.28)
RDW: 13.6 % (ref 12.3–15.4)
WBC: 6.7 10*3/uL (ref 3.4–10.8)

## 2017-12-25 LAB — TSH: TSH: 0.881 u[IU]/mL (ref 0.450–4.500)

## 2017-12-25 LAB — COMPREHENSIVE METABOLIC PANEL
A/G RATIO: 1.8 (ref 1.2–2.2)
ALK PHOS: 74 IU/L (ref 39–117)
ALT: 14 IU/L (ref 0–32)
AST: 22 IU/L (ref 0–40)
Albumin: 4 g/dL (ref 3.5–5.5)
BUN / CREAT RATIO: 11 (ref 9–23)
BUN: 10 mg/dL (ref 6–20)
Bilirubin Total: <0.2 mg/dL (ref 0.0–1.2)
CO2: 22 mmol/L (ref 20–29)
Calcium: 8.8 mg/dL (ref 8.7–10.2)
Chloride: 106 mmol/L (ref 96–106)
Creatinine, Ser: 0.92 mg/dL (ref 0.57–1.00)
GFR calc Af Amer: 91 mL/min/{1.73_m2} (ref >59)
GFR, EST NON AFRICAN AMERICAN: 79 mL/min/{1.73_m2} (ref >59)
GLOBULIN, TOTAL: 2.2 g/dL (ref 1.5–4.5)
Glucose: 69 mg/dL (ref 65–99)
POTASSIUM: 4.1 mmol/L (ref 3.5–5.2)
SODIUM: 140 mmol/L (ref 134–144)
Total Protein: 6.2 g/dL (ref 6.0–8.5)

## 2017-12-25 LAB — ANA+ENA+DNA/DS+ANTICH+CENTR
ANA Titer 1: NEGATIVE
Anti JO-1: <0.2 AI (ref 0.0–0.9)
Centromere Ab Screen: <0.2 AI (ref 0.0–0.9)
Chromatin Ab SerPl-aCnc: 0.4 AI (ref 0.0–0.9)
ENA RNP AB: 0.2 AI (ref 0.0–0.9)
ENA SSA (RO) Ab: <0.2 AI (ref 0.0–0.9)
ENA SSB (LA) Ab: <0.2 AI (ref 0.0–0.9)
dsDNA Ab: 1 IU/mL (ref 0–9)

## 2017-12-25 LAB — SEDIMENTATION RATE: Sed Rate: 3 mm/hr (ref 0–32)

## 2018-02-14 ENCOUNTER — Telehealth: Payer: Self-pay | Admitting: Medical

## 2018-02-14 ENCOUNTER — Encounter: Payer: Self-pay | Admitting: Neurology

## 2018-02-14 DIAGNOSIS — G43809 Other migraine, not intractable, without status migrainosus: Secondary | ICD-10-CM

## 2018-02-14 NOTE — Telephone Encounter (Signed)
Please refer to Dr. Everlena Cooper for migraines.

## 2018-02-14 NOTE — Telephone Encounter (Signed)
done
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
## 2018-04-28 ENCOUNTER — Encounter: Payer: Self-pay | Admitting: Neurology

## 2018-04-28 ENCOUNTER — Telehealth (INDEPENDENT_AMBULATORY_CARE_PROVIDER_SITE_OTHER): Payer: BLUE CROSS/BLUE SHIELD | Admitting: Neurology

## 2018-04-28 ENCOUNTER — Ambulatory Visit: Payer: BLUE CROSS/BLUE SHIELD | Admitting: Neurology

## 2018-04-28 ENCOUNTER — Other Ambulatory Visit: Payer: Self-pay

## 2018-04-28 VITALS — Ht 63.0 in | Wt 185.0 lb

## 2018-04-28 DIAGNOSIS — G43709 Chronic migraine without aura, not intractable, without status migrainosus: Secondary | ICD-10-CM | POA: Diagnosis not present

## 2018-04-28 MED ORDER — UBROGEPANT 100 MG PO TABS: 100.0000 mg | ORAL_TABLET | ORAL | 3 refills | Status: DC | PRN

## 2018-04-28 MED ORDER — ERENUMAB-AOOE 70 MG/ML ~~LOC~~ SOAJ: 70.0000 mg | SUBCUTANEOUS | 3 refills | Status: DC

## 2018-04-28 NOTE — Patient Instructions (Signed)
1.  Start Aimovig 70mg  monthly injection.  Continue topiramate for now. 2.  When you get a migraine, use Flexeril followed by Maxalt/ibuprofen/promethazine.  If you have a migraine on a third day in a week, take Bernita Raisin (may take with promethazine) 3.  Limit use of pain relievers to no more than 2 days out of week to prevent risk of rebound or medication-overuse headache. 4.  Take coenzyme Q 10 300mg  daily 5.  Continue exercise and hydration 6.  Refrain from caffeine. 7.  Do not skip meals. 8.  Follow up in 3 months.

## 2018-05-25 ENCOUNTER — Telehealth: Payer: Self-pay | Admitting: Medical

## 2018-05-25 NOTE — Telephone Encounter (Signed)
Left message on voicemail for patient to call back to schedule tele visit today.  

## 2018-05-25 NOTE — Telephone Encounter (Signed)
I received e-mail from her. Lets set her up tele visit/virtual visit today if possible for concerns she has.

## 2018-05-30 NOTE — Telephone Encounter (Signed)
Patient scheduled appointment for 05-31-18 at 130pm

## 2018-05-31 ENCOUNTER — Other Ambulatory Visit: Payer: Self-pay

## 2018-05-31 ENCOUNTER — Ambulatory Visit (INDEPENDENT_AMBULATORY_CARE_PROVIDER_SITE_OTHER): Payer: BLUE CROSS/BLUE SHIELD | Admitting: Medical

## 2018-05-31 VITALS — Wt 185.0 lb

## 2018-05-31 DIAGNOSIS — R202 Paresthesia of skin: Secondary | ICD-10-CM | POA: Diagnosis not present

## 2018-05-31 DIAGNOSIS — Z8739 Personal history of other diseases of the musculoskeletal system and connective tissue: Secondary | ICD-10-CM | POA: Diagnosis not present
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
## 2018-06-04 ENCOUNTER — Other Ambulatory Visit: Payer: Self-pay | Admitting: Medical

## 2018-06-04 ENCOUNTER — Other Ambulatory Visit: Payer: Self-pay | Admitting: Family Medicine

## 2018-06-04 DIAGNOSIS — R238 Other skin changes: Secondary | ICD-10-CM

## 2018-06-05 ENCOUNTER — Other Ambulatory Visit: Payer: Self-pay | Admitting: Family Medicine

## 2018-06-05 DIAGNOSIS — R238 Other skin changes: Secondary | ICD-10-CM

## 2018-06-05 NOTE — Telephone Encounter (Signed)
[facility] is requesting to fill pt med. Looks like you D/C and med not listed in pt chart Please advise [facility]

## 2018-06-05 NOTE — Telephone Encounter (Signed)
[facility] is requesting to fill pt

## 2018-08-08 ENCOUNTER — Other Ambulatory Visit: Payer: Self-pay

## 2018-08-08 DIAGNOSIS — Z20822 Contact with and (suspected) exposure to covid-19: Secondary | ICD-10-CM

## 2018-08-09 LAB — NOVEL CORONAVIRUS, NAA: SARS-CoV-2, NAA: NOT DETECTED

## 2018-08-16 ENCOUNTER — Ambulatory Visit: Payer: BLUE CROSS/BLUE SHIELD | Admitting: Neurology

## 2018-09-21 ENCOUNTER — Ambulatory Visit: Payer: BLUE CROSS/BLUE SHIELD | Admitting: Neurology
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
## 2018-09-27 ENCOUNTER — Encounter: Payer: Self-pay | Admitting: Neurology

## 2018-09-27 ENCOUNTER — Ambulatory Visit (INDEPENDENT_AMBULATORY_CARE_PROVIDER_SITE_OTHER): Payer: BC Managed Care – PPO | Admitting: Neurology

## 2018-09-27 ENCOUNTER — Other Ambulatory Visit: Payer: Self-pay

## 2018-09-27 VITALS — BP 123/87 | HR 81 | Ht 63.0 in | Wt 194.4 lb

## 2018-09-27 DIAGNOSIS — G43009 Migraine without aura, not intractable, without status migrainosus: Secondary | ICD-10-CM | POA: Diagnosis not present

## 2018-09-27 MED ORDER — RIZATRIPTAN BENZOATE 10 MG PO TABS: 10.0000 mg | ORAL_TABLET | ORAL | 3 refills | Status: DC | PRN

## 2018-09-27 MED ORDER — AIMOVIG 140 MG/ML ~~LOC~~ SOAJ: 140.0000 mg | SUBCUTANEOUS | 11 refills | Status: DC

## 2018-09-27 MED ORDER — PROMETHAZINE HCL 25 MG RE SUPP: 25.0000 mg | Freq: Four times a day (QID) | RECTAL | 5 refills | Status: DC | PRN

## 2018-09-27 MED ORDER — AIMOVIG 140 MG/ML ~~LOC~~ SOAJ: 1.0000 | Freq: Once | SUBCUTANEOUS | 0 refills | Status: AC

## 2018-09-27 NOTE — Patient Instructions (Signed)
1.  We will increase Aimovig to 140mg  monthly 2.  Follow up in 6 months.

## 2018-09-27 NOTE — Progress Notes (Signed)
Aimovig 140mg  sample given in clinic today. #1

## 2018-09-27 NOTE — Addendum Note (Signed)
Addended byTomi Likens, ADAM R on: 09/27/2018 04:02 PM   Modules accepted: Orders

## 2018-11-22 ENCOUNTER — Other Ambulatory Visit: Payer: Self-pay

## 2018-11-22 DIAGNOSIS — Z20822 Contact with and (suspected) exposure to covid-19: Secondary | ICD-10-CM

## 2018-11-23 ENCOUNTER — Telehealth: Payer: Self-pay | Admitting: Neurology

## 2018-11-23 NOTE — Telephone Encounter (Signed)
There is no quick fix to break the daily headaches.  The question is why she is having increased headaches.  Has she identified a trigger (stress, weather, etc).  Also, I had prescribed her Roselyn Meier (in the same family as Nurtec).  It was previously helpful.  Is it no longer effective?  We can increase topiramate to 200mg  at bedtime to see if that will help reduce frequency.

## 2018-11-23 NOTE — Telephone Encounter (Signed)
See below

## 2018-11-23 NOTE — Telephone Encounter (Signed)
Patient is calling in about her rebound cycle migraines- she has had 14 migraines within the 21 days. She is wondering if there is something she can do to help get her out of this cycle. She wakes up every day with headache but sometimes they go away and sometimes not. she wanted to know if she was a candidate Nurtec. Thanks!

## 2018-11-23 NOTE — Telephone Encounter (Signed)
Called patient to discuss provider response below no answer left message to call office back to discuss.

## 2018-11-24 LAB — NOVEL CORONAVIRUS, NAA: SARS-CoV-2, NAA: NOT DETECTED

## 2018-11-24 LAB — SPECIMEN STATUS REPORT

## 2018-11-24 NOTE — Telephone Encounter (Signed)
Recommend restarting topiramate daily for now (in addition to [place].  Continue 140mg ).  We can always stop it down the line when headaches improve again.  Start topiramate 25mg  at bedtime for one week, then 50mg  at bedtime.

## 2018-11-24 NOTE — Telephone Encounter (Signed)
Called spoke with patient she was informed of provider response and will take Rx as prescribe by provider.

## 2018-11-24 NOTE — Telephone Encounter (Signed)
Called spoke with patient she she wakes up with headaches and as the day goes on they get progressively worst. She thinks it may be weather related because she has nasal congestion as well in the morning.  She states that the [place] helps some day and if it does she takes topiramate to along with it and that helps.  She has been off topiramate for about 8 months now.  Pt states that she was going better on the aimovig 70 mg   [facility]   OK to leave message on voice mail if she does not answer

## 2018-11-28 ENCOUNTER — Other Ambulatory Visit: Payer: Self-pay

## 2018-11-28 DIAGNOSIS — Z20822 Contact with and (suspected) exposure to covid-19: Secondary | ICD-10-CM

## 2018-11-30 LAB — NOVEL CORONAVIRUS, NAA: SARS-CoV-2, NAA: DETECTED — AB

## 2018-12-01 ENCOUNTER — Telehealth: Payer: Self-pay | Admitting: Unknown Physician Specialty

## 2018-12-01 NOTE — Telephone Encounter (Signed)
Telephone encounter in error.

## 2018-12-11 ENCOUNTER — Other Ambulatory Visit: Payer: Self-pay

## 2018-12-11 ENCOUNTER — Encounter: Payer: Self-pay | Admitting: Medical

## 2018-12-11 ENCOUNTER — Ambulatory Visit: Payer: BC Managed Care – PPO | Admitting: Medical

## 2018-12-11 VITALS — Temp 96.9°F | Ht 63.0 in | Wt 190.0 lb

## 2018-12-11 DIAGNOSIS — R5383 Other fatigue: Secondary | ICD-10-CM

## 2018-12-11 DIAGNOSIS — R110 Nausea: Secondary | ICD-10-CM

## 2018-12-11 DIAGNOSIS — R52 Pain, unspecified: Secondary | ICD-10-CM | POA: Diagnosis not present

## 2018-12-11 DIAGNOSIS — U071 COVID-19: Secondary | ICD-10-CM

## 2018-12-11 DIAGNOSIS — R05 Cough: Secondary | ICD-10-CM | POA: Diagnosis not present

## 2018-12-11 DIAGNOSIS — R059 Cough, unspecified: Secondary | ICD-10-CM

## 2018-12-11 MED ORDER — BENZONATATE 100 MG PO CAPS: 100.0000 mg | ORAL_CAPSULE | Freq: Three times a day (TID) | ORAL | 0 refills | Status: DC | PRN

## 2018-12-11 NOTE — Progress Notes (Signed)
Subjective:     Patient ID: Mariah Ferrell, female   DOB: Jun 11, 1980, 38 y.o.   MRN: [id number]  This visit type was conducted due to national recommendations for restrictions regarding the COVID-19 Pandemic (e.g. social distancing) in an effort to limit this patient's exposure and mitigate transmission in our community.  Due to their co-morbid illnesses, this patient is at least at moderate risk for complications without adequate follow up.  This format is felt to be most appropriate for this patient at this time.    Documentation for virtual audio and video telecommunications through Zoom encounter:  The patient was located at home. The provider was located in the office. The patient did consent to this visit and is aware of possible charges through their insurance for this visit.  The other persons participating in this telemedicine service were none. Time spent on call was 20 minutes and in review of previous records 20 minutes total.  This virtual service is not related to other E/M service within previous 7 days.   HPI Chief Complaint  Patient presents with  . Follow-up    +COVID on 11/30/18-fatigue, bodyache, headache    Virtual consult today for Covid.  She first started having symptoms on November 23rd.  Symptoms over the course of the last 2 weeks have included headache, congestion, fatigue, some cough, body aches, some nausea,.  She never had fever.  She only felt short of breath on the first day.  She never really had bad chest symptoms or bad cough just a mild cough.  Symptoms would be intermittent, 1 day would feel fine the next day a little rough on and off.  Her husband and daughter all got sick at the same time.  They all tested positive and got the results back on Thanksgiving day.  All of them are basically improved other than the been a little bit of lingering symptoms.  She is currently on day 14 today of quarantine.  She still has some fatigue, some headache some body  aches mild cough here and there.  No other aggravating or relieving factors.  Review of Systems As in subjective    Objective:   Physical Exam Due to coronavirus pandemic stay at home measures, patient visit was virtual and they were not examined in person.   Temp (!) 96.9 F (36.1 C)   Ht 5\' 3"  (1.6 m)   Wt 190 lb (86.2 kg)   BMI 33.66 kg/m       Assessment:     Encounter Diagnoses  Name Primary?  . COVID-19 virus infection Yes  . Cough   . Body aches   . Nausea   . Fatigue, unspecified type        Plan:     We discussed her symptoms and concerns.  We discussed possible complications of Covid going forward.  We discussed that fatigue and overall time to fully recover can be over a month.  She has completed her 14 days of quarantine however we discussed going back to work in 2 days on 9 December.  This will give her a little bit more time to recover.  She can use the cough medication below for the mild rare cough she has.  Continue to hydrate, rest, gradually resume activities as tolerated.  I will write a work note on her behalf.  If any worsening or new symptoms she will call back.  Ilda was seen today for follow-up.  Diagnoses and all orders for this visit:  COVID-19 virus infection  Cough  Body aches  Nausea  Fatigue, unspecified type  Other orders -     benzonatate (TESSALON) 100 MG capsule; Take 1 capsule (100 mg total) by mouth 3 (three) times daily as needed for cough.

## 2018-12-11 NOTE — Progress Notes (Signed)
Done

## 2018-12-11 NOTE — Progress Notes (Signed)
There is noting attached

## 2018-12-19 ENCOUNTER — Other Ambulatory Visit: Payer: Self-pay

## 2018-12-19 ENCOUNTER — Other Ambulatory Visit: Payer: Self-pay | Admitting: Neurology

## 2018-12-19 ENCOUNTER — Telehealth: Payer: Self-pay | Admitting: Neurology

## 2018-12-19 MED ORDER — PREDNISONE 10 MG (21) PO TBPK: ORAL_TABLET | ORAL | 0 refills | Status: DC

## 2018-12-19 NOTE — Telephone Encounter (Signed)
Requested Prescriptions   Pending Prescriptions Disp Refills  . UBRELVY 100 MG TABS [Pharmacy Med Name: UBRELVY 100 MG TABLET] 30 tablet 3    Sig: TAKE 1 TABLET BY MOUTH AS NEEDED. MAY REPEAT AFTER 2 HOURS IF NEEDED. MAXIMUM 2 TABLETS IN 24HRS   Rx last filled:04/28/18 #30 3 refills  Pt last seen:09/27/18  Follow up appt scheduled:03/27/19

## 2018-12-19 NOTE — Telephone Encounter (Signed)
Positive for Covid on Thanksgiving,note is back at work now. Headache started before she was diagnosed with Covid, its a nausea dull headache, keeps coming back. Please advise, its happening aod. States she has no smell, no new medications, currently meds, Maxalt used but states headache comes back. Took Advil 200mg  4 at 430. Taken aimovig once a month.

## 2018-12-19 NOTE — Telephone Encounter (Signed)
Requested Prescriptions   Pending Prescriptions Disp Refills  . predniSONE (STERAPRED UNI-PAK 21 TAB) 10 MG (21) TBPK tablet 21 tablet 0    Sig: Take 6tabs x1day, then 5tabs x1day, then 4tabs x1day, then 3tabs x1day, then 2tabs x1day, then 1tab x1day, then STOP   Rx sent

## 2018-12-19 NOTE — Telephone Encounter (Signed)
Patient is calling in about having COVID since thanksgiving and she has had a headache every day since. She is wanting to know if she can talk with a nurse and was wanting to know what to do. She doesn't have any more of the topimax if that's the advice given. Thanks!

## 2018-12-19 NOTE — Telephone Encounter (Signed)
Patient notified of the medicaiton.

## 2018-12-19 NOTE — Telephone Encounter (Signed)
No answer 132

## 2018-12-19 NOTE — Telephone Encounter (Signed)
She should be on topiramate 50mg  at bedtime.  I would increase to 100mg  at bedtime.  To help try and break intractable headache, prescribe prednisone 10mg  tablet taper:  Take 60mg  x1day, then 50mg  x1day, then 40mg  x1day, then 30mg  x1day, then 20mg  x1day, then 10mg  x1day, then STOP

## 2019-03-26 ENCOUNTER — Encounter: Payer: Self-pay | Admitting: Neurology
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
## 2019-03-27 ENCOUNTER — Telehealth (INDEPENDENT_AMBULATORY_CARE_PROVIDER_SITE_OTHER): Payer: BC Managed Care – PPO | Admitting: Neurology

## 2019-03-27 ENCOUNTER — Other Ambulatory Visit: Payer: Self-pay

## 2019-03-27 VITALS — Ht 63.0 in | Wt 185.0 lb

## 2019-03-27 DIAGNOSIS — M898X1 Other specified disorders of bone, shoulder: Secondary | ICD-10-CM | POA: Diagnosis not present

## 2019-03-27 DIAGNOSIS — G43009 Migraine without aura, not intractable, without status migrainosus: Secondary | ICD-10-CM | POA: Diagnosis not present

## 2019-03-27 DIAGNOSIS — M545 Low back pain, unspecified: Secondary | ICD-10-CM

## 2019-03-27 MED ORDER — CYCLOBENZAPRINE HCL 10 MG PO TABS: 10.0000 mg | ORAL_TABLET | Freq: Three times a day (TID) | ORAL | 5 refills | Status: DC | PRN

## 2019-05-17 ENCOUNTER — Telehealth (INDEPENDENT_AMBULATORY_CARE_PROVIDER_SITE_OTHER): Payer: 59 | Admitting: Family Medicine

## 2019-05-17 ENCOUNTER — Encounter: Payer: Self-pay | Admitting: Family Medicine

## 2019-05-17 ENCOUNTER — Other Ambulatory Visit: Payer: Self-pay

## 2019-05-17 VITALS — HR 106 | Temp 98.3°F | Wt 200.0 lb

## 2019-05-17 DIAGNOSIS — R5383 Other fatigue: Secondary | ICD-10-CM

## 2019-05-17 DIAGNOSIS — Z8616 Personal history of COVID-19: Secondary | ICD-10-CM

## 2019-05-17 DIAGNOSIS — J029 Acute pharyngitis, unspecified: Secondary | ICD-10-CM

## 2019-05-17 DIAGNOSIS — R509 Fever, unspecified: Secondary | ICD-10-CM | POA: Diagnosis not present

## 2019-05-17 DIAGNOSIS — R52 Pain, unspecified: Secondary | ICD-10-CM

## 2019-05-17 NOTE — Progress Notes (Signed)
   Subjective:  Documentation for virtual audio and video telecommunications through Caregility encounter:  The patient was located at home. 2 patient identifiers used.  The provider was located in the office. The patient did consent to this visit and is aware of possible charges through their insurance for this visit.  The other persons participating in this telemedicine service were none. Time spent on call was 10 minutes and in review of previous records 15 minutes total.  This virtual service is not related to other E/M service within previous 7 days.   Patient ID: Mariah Ferrell, female    DOB: [birth year], 39 y.o.   MRN: [id number]  HPI Chief Complaint  Patient presents with  . sick    Sore throat, fever 99.9, achy and tired, ear pain, body aches- monday/tuesday, had covid back in november   Complains of a 3-4 day history of feeling hot, chills, body aches, lethargic and sore throat.  Reports 3 out of 10 throat pain.  States her cervical lymph nodes feel sore to touch. Sneezing, mild nasal congestion and coughing yesterday.  States last night she had a temp of 99.9.   She has history of migraine headaches and has taken Ubrelvy for mild headache.  Otherwise, she has not taken anything for her symptoms.  Hx of covid infection in late November.  She also has a hx of recurrent tonsillitis.   Vaccinated for Covid in February.    Review of Systems Pertinent positives and negatives in the history of present illness.     Objective:   Physical Exam Pulse (!) 106   Temp 98.3 F (36.8 C)   Wt 200 lb (90.7 kg)   BMI 35.43 kg/m    Alert and oriented and in no acute distress.  Respirations unlabored.  Normal speech.     Assessment & Plan:  Acute pharyngitis, unspecified etiology  Fever and chills  Body aches  Fatigue, unspecified type  History of COVID-19  Discussed possible etiologies for symptoms including strep pharyngitis versus acute viral illness.  History of  Covid and has been vaccinated as well.  This is low on my list of differentials but she will go and get a Covid test. Recommend she also get tested for strep throat. Discussed symptomatic treatment including hydration, Tylenol or ibuprofen, salt water gargles. Recommend she call back with any new or worsening symptoms.  I will have a low threshold to prescribe antibiotics if she is worsening at all.

## 2019-05-23 ENCOUNTER — Telehealth: Payer: Self-pay

## 2019-05-23 NOTE — Telephone Encounter (Signed)
I called the pt. Per Melissa to get more info off of her insurance card. I need the claim address for her [employer].

## 2019-07-05 DIAGNOSIS — Z419 Encounter for procedure for purposes other than remedying health state, unspecified: Secondary | ICD-10-CM | POA: Diagnosis not present

## 2019-07-10 ENCOUNTER — Other Ambulatory Visit: Payer: Self-pay | Admitting: Obstetrics and Gynecology

## 2019-07-10 DIAGNOSIS — R928 Other abnormal and inconclusive findings on diagnostic imaging of breast: Secondary | ICD-10-CM

## 2019-07-12 ENCOUNTER — Ambulatory Visit
Admission: RE | Admit: 2019-07-12 | Discharge: 2019-07-12 | Disposition: A | Discharge status: Home / Self Care | Payer: 59 | Source: Ambulatory Visit | Attending: Obstetrics and Gynecology | Admitting: Obstetrics and Gynecology

## 2019-07-12 ENCOUNTER — Other Ambulatory Visit: Payer: Self-pay | Admitting: Obstetrics and Gynecology

## 2019-07-12 ENCOUNTER — Other Ambulatory Visit: Payer: Self-pay

## 2019-07-12 DIAGNOSIS — R928 Other abnormal and inconclusive findings on diagnostic imaging of breast: Secondary | ICD-10-CM

## 2019-07-13 ENCOUNTER — Other Ambulatory Visit: Payer: Self-pay

## 2019-07-17 ENCOUNTER — Telehealth: Payer: Self-pay | Admitting: Medical

## 2019-07-17 NOTE — Telephone Encounter (Signed)
Please see the last email messages.  I am not sure if we already did the referral or not.  If not processed already, she gave the reasons which would be related to Covid and anxiety, long haulers concern in her email.  Let us also get her back in here for follow-up

## 2019-07-26 ENCOUNTER — Other Ambulatory Visit: Payer: Self-pay

## 2019-07-26 ENCOUNTER — Ambulatory Visit
Admission: RE | Admit: 2019-07-26 | Discharge: 2019-07-26 | Disposition: A | Discharge status: Home / Self Care | Payer: 59 | Source: Ambulatory Visit | Attending: Obstetrics and Gynecology | Admitting: Obstetrics and Gynecology

## 2019-07-26 ENCOUNTER — Ambulatory Visit
Admission: RE | Admit: 2019-07-26 | Discharge: 2019-07-26 | Disposition: A | Discharge status: Home / Self Care | Payer: Medicaid Other | Source: Ambulatory Visit | Attending: Obstetrics and Gynecology | Admitting: Obstetrics and Gynecology

## 2019-07-26 ENCOUNTER — Other Ambulatory Visit: Payer: Self-pay | Admitting: Obstetrics and Gynecology

## 2019-07-26 DIAGNOSIS — R928 Other abnormal and inconclusive findings on diagnostic imaging of breast: Secondary | ICD-10-CM

## 2019-07-26 NOTE — Telephone Encounter (Signed)
Yes, you can close this.

## 2019-07-26 NOTE — Telephone Encounter (Signed)
Can I close this message?  Its still open in my inbasket?

## 2019-08-05 DIAGNOSIS — Z419 Encounter for procedure for purposes other than remedying health state, unspecified: Secondary | ICD-10-CM | POA: Diagnosis not present

## 2019-08-20 ENCOUNTER — Other Ambulatory Visit: Payer: Self-pay | Admitting: Neurology

## 2019-08-24 ENCOUNTER — Telehealth: Payer: Self-pay

## 2019-08-24 NOTE — Telephone Encounter (Signed)
Schedule her a fasting f/u, or CPX if due

## 2019-08-24 NOTE — Telephone Encounter (Signed)
Pt. Called stating that she recently saw her gyn and her BP was elevated then couldn't remember the numbers but she has checked it twice since that apt. Per the gyn and it was high both times 160/104 and today it was 153/119 per her gyn needs to f/u with PCP for this issue.

## 2019-08-27 NOTE — Telephone Encounter (Signed)
Called pt. LM to get schedule an apt. For a fasting f/u

## 2019-08-31 ENCOUNTER — Other Ambulatory Visit: Payer: Self-pay

## 2019-08-31 ENCOUNTER — Encounter: Payer: Self-pay | Admitting: Neurology

## 2019-08-31 ENCOUNTER — Telehealth (INDEPENDENT_AMBULATORY_CARE_PROVIDER_SITE_OTHER): Payer: 59 | Admitting: Neurology

## 2019-08-31 VITALS — Ht 63.0 in | Wt 200.0 lb

## 2019-08-31 DIAGNOSIS — G43009 Migraine without aura, not intractable, without status migrainosus: Secondary | ICD-10-CM | POA: Diagnosis not present

## 2019-08-31 MED ORDER — ZONISAMIDE 25 MG PO CAPS: ORAL_CAPSULE | ORAL | 0 refills | Status: DC

## 2019-08-31 MED ORDER — UBRELVY 100 MG PO TABS: ORAL_TABLET | ORAL | 3 refills | Status: DC

## 2019-08-31 MED ORDER — PROMETHAZINE HCL 25 MG PO TABS: 25.0000 mg | ORAL_TABLET | Freq: Three times a day (TID) | ORAL | 5 refills | Status: DC | PRN

## 2019-08-31 MED ORDER — AIMOVIG 140 MG/ML ~~LOC~~ SOAJ: 140.0000 mg | SUBCUTANEOUS | 11 refills | Status: DC

## 2019-08-31 NOTE — Addendum Note (Signed)
Addended byEverlena Cooper, Kelilah Hebard R on: 08/31/2019 04:14 PM   Modules accepted: Orders
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
## 2019-09-05 DIAGNOSIS — Z419 Encounter for procedure for purposes other than remedying health state, unspecified: Secondary | ICD-10-CM | POA: Diagnosis not present

## 2019-09-11 ENCOUNTER — Encounter: Payer: Self-pay | Admitting: Medical

## 2019-09-11 ENCOUNTER — Telehealth: Payer: Self-pay | Admitting: Medical

## 2019-09-11 ENCOUNTER — Ambulatory Visit (INDEPENDENT_AMBULATORY_CARE_PROVIDER_SITE_OTHER): Payer: 59 | Admitting: Medical

## 2019-09-11 VITALS — BP 120/82 | HR 86 | Ht 63.0 in | Wt 219.2 lb

## 2019-09-11 DIAGNOSIS — R635 Abnormal weight gain: Secondary | ICD-10-CM

## 2019-09-11 DIAGNOSIS — G43709 Chronic migraine without aura, not intractable, without status migrainosus: Secondary | ICD-10-CM | POA: Diagnosis not present

## 2019-09-11 DIAGNOSIS — R030 Elevated blood-pressure reading, without diagnosis of hypertension: Secondary | ICD-10-CM

## 2019-09-11 MED ORDER — SAXENDA 18 MG/3ML ~~LOC~~ SOPN: 3.0000 mg | PEN_INJECTOR | Freq: Every day | SUBCUTANEOUS | 1 refills | Status: DC

## 2019-09-11 NOTE — Progress Notes (Signed)
done

## 2019-09-11 NOTE — Patient Instructions (Signed)
Hello Mariah Ferrell  The study I mention does have a requirement of being diabetic which you are not thankfully.  So instead, begin Saxenda daily subcutaneous injection for help with efforts to lose weight.  Begin 0.6mg  daily or the first mark on the pen device at bedtime for the first week.  Increase to the next marking on the pen device at 1.2mg  daily for the next week.   By week 3 you can continue to increase the dose as long as no significant side effects such as major nausea.  By week 3 you can increase to 1.8mg  daily or the third marking on the pen device.  At week 4, increase to 3mg  daily which is the maximum dose.  Continue at the 3mg  dose or max dose.  If after the first week you already noticed nausea you can stay a each dose for 2 weeks at a time if needed to gradually titrate up slower. The regular or high as dose is 3 mg. But you can certainly start out as above or slower.  Continue efforts to exercise with moderate intensity exercise 150 minutes or more per week  Set short term goals for fitness.  Have a disciplined and careful diet.     Intermittent fasting (IF) is an eating pattern that cycles between periods of fasting and eating. It's currently very popular in the health and fitness community.  Intermittent Fasting Methods There are several different ways of doing intermittent fasting -- all of which involve splitting the day or week into eating and fasting periods.  During the fasting periods, you eat either very little or nothing at all.  These are the most popular methods:  The 16/8 method involves skipping breakfast and restricting your daily eating period to 8 hours, such as 1-9 p.m. Then you fast for 16 hours in between.  Eat-Stop-Eat: This involves fasting for 24 hours, once or twice a week, for example by not eating from dinner one day until dinner the next day.  The 5:2 diet: With this method, you consume only 500-600 calories on two nonconsecutive days of the  week, but eat normally the other 5 days.  By reducing your calorie intake, all of these methods should cause weight loss as long as you don't compensate by eating much more during the eating periods.  Many people find the 16/8 method to be the simplest, most sustainable and easiest to stick to. It's also the most popular.     DASH Eating Plan DASH stands for "Dietary Approaches to Stop Hypertension." The DASH eating plan is a healthy eating plan that has been shown to reduce high blood pressure (hypertension). It may also reduce your risk for type 2 diabetes, heart disease, and stroke. The DASH eating plan may also help with weight loss. What are tips for following this plan?  General guidelines  Avoid eating more than 2,300 mg (milligrams) of salt (sodium) a day. If you have hypertension, you may need to reduce your sodium intake to 1,500 mg a day.  Limit alcohol intake to no more than 1 drink a day for nonpregnant women and 2 drinks a day for men. One drink equals 12 oz of beer, 5 oz of wine, or 1 oz of hard liquor.  Work with your health care provider to maintain a healthy body weight or to lose weight. Ask what an ideal weight is for you.  Get at least 30 minutes of exercise that causes your heart to beat faster (aerobic exercise) most  days of the week. Activities may include walking, swimming, or biking.  Work with your health care provider or diet and nutrition specialist (dietitian) to adjust your eating plan to your individual calorie needs. Reading food labels   Check food labels for the amount of sodium per serving. Choose foods with less than 5 percent of the Daily Value of sodium. Generally, foods with less than 300 mg of sodium per serving fit into this eating plan.  To find whole grains, look for the word "whole" as the first word in the ingredient list. Shopping  Buy products labeled as "low-sodium" or "no salt added."  Buy fresh foods. Avoid canned foods and premade  or frozen meals. Cooking  Avoid adding salt when cooking. Use salt-free seasonings or herbs instead of table salt or sea salt. Check with your health care provider or pharmacist before using salt substitutes.  Do not fry foods. Cook foods using healthy methods such as baking, boiling, grilling, and broiling instead.  Cook with heart-healthy oils, such as olive, canola, soybean, or sunflower oil. Meal planning  Eat a balanced diet that includes: ? 5 or more servings of fruits and vegetables each day. At each meal, try to fill half of your plate with fruits and vegetables. ? Up to 6-8 servings of whole grains each day. ? Less than 6 oz of lean meat, poultry, or fish each day. A 3-oz serving of meat is about the same size as a deck of cards. One egg equals 1 oz. ? 2 servings of low-fat dairy each day. ? A serving of nuts, seeds, or beans 5 times each week. ? Heart-healthy fats. Healthy fats called Omega-3 fatty acids are found in foods such as flaxseeds and coldwater fish, like sardines, salmon, and mackerel.  Limit how much you eat of the following: ? Canned or prepackaged foods. ? Food that is high in trans fat, such as fried foods. ? Food that is high in saturated fat, such as fatty meat. ? Sweets, desserts, sugary drinks, and other foods with added sugar. ? Full-fat dairy products.  Do not salt foods before eating.  Try to eat at least 2 vegetarian meals each week.  Eat more home-cooked food and less restaurant, buffet, and fast food.  When eating at a restaurant, ask that your food be prepared with less salt or no salt, if possible. What foods are recommended? The items listed may not be a complete list. Talk with your dietitian about what dietary choices are best for you. Grains Whole-grain or whole-wheat bread. Whole-grain or whole-wheat pasta. Brown rice. Mariah Ferrell. Bulgur. Whole-grain and low-sodium cereals. Pita bread. Low-fat, low-sodium crackers. Whole-wheat flour  tortillas. Vegetables Fresh or frozen vegetables (raw, steamed, roasted, or grilled). Low-sodium or reduced-sodium tomato and vegetable juice. Low-sodium or reduced-sodium tomato sauce and tomato paste. Low-sodium or reduced-sodium canned vegetables. Fruits All fresh, dried, or frozen fruit. Canned fruit in natural juice (without added sugar). Meat and other protein foods Skinless chicken or Mariah Ferrell. Ground chicken or Mariah Ferrell. Pork with fat trimmed off. Fish and seafood. Egg whites. Dried beans, peas, or lentils. Unsalted nuts, nut butters, and seeds. Unsalted canned beans. Lean cuts of beef with fat trimmed off. Low-sodium, lean deli meat. Dairy Low-fat (1%) or fat-free (skim) milk. Fat-free, low-fat, or reduced-fat cheeses. Nonfat, low-sodium ricotta or cottage cheese. Low-fat or nonfat yogurt. Low-fat, low-sodium cheese. Fats and oils Soft margarine without trans fats. Vegetable oil. Low-fat, reduced-fat, or light mayonnaise and salad dressings (reduced-sodium). Canola, safflower, olive, soybean,  and sunflower oils. Avocado. Seasoning and other foods Herbs. Spices. Seasoning mixes without salt. Unsalted popcorn and pretzels. Fat-free sweets. What foods are not recommended? The items listed may not be a complete list. Talk with your dietitian about what dietary choices are best for you. Grains Baked goods made with fat, such as croissants, muffins, or some breads. Dry pasta or rice meal packs. Vegetables Creamed or fried vegetables. Vegetables in a cheese sauce. Regular canned vegetables (not low-sodium or reduced-sodium). Regular canned tomato sauce and paste (not low-sodium or reduced-sodium). Regular tomato and vegetable juice (not low-sodium or reduced-sodium). Mariah Ferrell. Olives. Fruits Canned fruit in a light or heavy syrup. Fried fruit. Fruit in cream or butter sauce. Meat and other protein foods Fatty cuts of meat. Ribs. Fried meat. Mariah Ferrell. Sausage. Bologna and other processed lunch meats.  Salami. Fatback. Hotdogs. Bratwurst. Salted nuts and seeds. Canned beans with added salt. Canned or smoked fish. Whole eggs or egg yolks. Chicken or Mariah Ferrell with skin. Dairy Whole or 2% milk, cream, and half-and-half. Whole or full-fat cream cheese. Whole-fat or sweetened yogurt. Full-fat cheese. Nondairy creamers. Whipped toppings. Processed cheese and cheese spreads. Fats and oils Butter. Stick margarine. Lard. Shortening. Ghee. Bacon fat. Tropical oils, such as coconut, palm kernel, or palm oil. Seasoning and other foods Salted popcorn and pretzels. Onion salt, garlic salt, seasoned salt, table salt, and sea salt. Worcestershire sauce. Tartar sauce. Barbecue sauce. Teriyaki sauce. Soy sauce, including reduced-sodium. Steak sauce. Canned and packaged gravies. Fish sauce. Oyster sauce. Cocktail sauce. Horseradish that you find on the shelf. Ketchup. Mustard. Meat flavorings and tenderizers. Bouillon cubes. Hot sauce and Tabasco sauce. Premade or packaged marinades. Premade or packaged taco seasonings. Relishes. Regular salad dressings. Where to find more information:  National Heart, Lung, and Blood Institute: PopSteam.is  American Heart Association: www.heart.org Summary  The DASH eating plan is a healthy eating plan that has been shown to reduce high blood pressure (hypertension). It may also reduce your risk for type 2 diabetes, heart disease, and stroke.  With the DASH eating plan, you should limit salt (sodium) intake to 2,300 mg a day. If you have hypertension, you may need to reduce your sodium intake to 1,500 mg a day.  When on the DASH eating plan, aim to eat more fresh fruits and vegetables, whole grains, lean proteins, low-fat dairy, and heart-healthy fats.  Work with your health care provider or diet and nutrition specialist (dietitian) to adjust your eating plan to your individual calorie needs. This information is not intended to replace advice given to you by your health  care provider. Make sure you discuss any questions you have with your health care provider. Document Revised: 12/03/2016 Document Reviewed: 12/15/2015 Elsevier Patient Education  2020 [employer].    Follow up in 4-6 weeks.

## 2019-09-11 NOTE — Progress Notes (Signed)
Subjective: Chief Complaint  Patient presents with  . Follow-up    with fasting labs  . Hypertension  . Weight Loss   Here for f/u.  Been having elevated BP readings. Saw gynecology and dentist recently and had high readings at those offices in addition to checking it at work.  She had my chart emailed me some home readings which were high.  She feels this is attributed to weight.  She is the heaviest she has been.  Wants help losing weight.  No prior diagnosis of HTN, no prior medication for BP either.   Has been on beta blocker in past for migraines but not BP.  No chest pain, no leg edema, no palpations.    Having horrible heartburn, GERD, and some cough she attributes to the weight gain.  This has been the case in the past with weight gain.  She takes the monthly estradiol and bupropion.  Her sister has lost a lot of weight on Saxenda, wants to try this.   Mariah Ferrell has been on phentermine in the past as well.   She is limited by long work hours. Does get some exercise. Sweets are her problem area.   No other aggravating or relieving factors. No other complaint.    Past Medical History:  Diagnosis Date  . Migraines    Current Outpatient Medications on File Prior to Visit  Medication Sig Dispense Refill  . buPROPion (WELLBUTRIN XL) 150 MG 24 hr tablet Take 150 mg by mouth every morning.    Dorise Hiss (AIMOVIG) 140 MG/ML SOAJ Inject 140 mg into the skin every 28 (twenty-eight) days. 1 mL 11  . estradiol (ESTRACE) 0.5 MG tablet Take 0.5 mg by mouth daily.    Marland Kitchen levonorgestrel (MIRENA) 20 MCG/24HR IUD 1 each by Intrauterine route once. June 2016    . senna (SENOKOT) 8.6 MG TABS tablet Take 1 tablet by mouth.    . Ubrogepant (UBRELVY) 100 MG TABS TAKE 1 TABLET BY MOUTH AS NEEDED. MAY REPEAT AFTER 2 HOURS IF NEEDED. MAXIMUM 2 TABLETS IN 24HRS 30 tablet 3  . Biotin 10 MG TABS Take by mouth. (Patient not taking: Reported on 08/31/2019)    . busPIRone (BUSPAR) 10 MG tablet Take  10 mg by mouth 2 (two) times daily as needed. (Patient not taking: Reported on 09/11/2019)    . cyclobenzaprine (FLEXERIL) 10 MG tablet Take 1 tablet (10 mg total) by mouth 3 (three) times daily as needed for muscle spasms. (Patient not taking: Reported on 09/11/2019) 30 tablet 5  . hydrOXYzine (ATARAX/VISTARIL) 25 MG tablet Take 25-50 mg by mouth at bedtime as needed. (Patient not taking: Reported on 09/11/2019)    . ibuprofen (ADVIL,MOTRIN) 600 MG tablet 800 mg. (Patient not taking: Reported on 09/11/2019)    . promethazine (PHENERGAN) 25 MG suppository Place 1 suppository (25 mg total) rectally every 6 (six) hours as needed for nausea or vomiting. (Patient not taking: Reported on 08/31/2019) 12 each 5  . rizatriptan (MAXALT) 10 MG tablet TAKE 1 TABLET AS NEEDED FOR MIGRAINE. MAY REPEAT IN 2 HOURS IF NEEDED. MAX 2 TABS/24HRS (Patient not taking: Reported on 09/11/2019) 10 tablet 3  . zonisamide (ZONEGRAN) 25 MG capsule Take 1 capsule daily for one week, then 2 capsules daily for one week, then 3 capsules daily for one week, then 4 capsules daily. (Patient not taking: Reported on 09/11/2019) 120 capsule 0   No current facility-administered medications on file prior to visit.   ROS as in subjective  Objective: BP 120/82   Pulse 86   Ht 5\' 3"  (1.6 m)   Wt 219 lb 3.2 oz (99.4 kg)   SpO2 97%   BMI 38.83 kg/m    Wt Readings from Last 3 Encounters:  09/11/19 219 lb 3.2 oz (99.4 kg)  08/31/19 200 lb (90.7 kg)  05/17/19 200 lb (90.7 kg)   BP Readings from Last 3 Encounters:  09/11/19 120/82  09/27/18 123/87  12/23/17 120/76   My person reading today in right arm was 130/96.   Gen: wd, wn ,nad, African American female Heart rrr, normal s1, s2, no murmurs Lungs clear Ext: no edema Pulses wnl    Assessment: Encounter Diagnoses  Name Primary?  . Weight gain Yes  . Elevated blood-pressure reading without diagnosis of hypertension   . Chronic migraine without aura without status migrainosus,  not intractable      Plan: We discussed her elevated blood pressure readings in recent. We discussed her weight gain. We discussed strategies to lose weight. We discussed risk of uncontrolled hypertension.  Her prior blood pressure readings here have mostly been normal. My read today was little elevated.  She wants to focus on weight loss as a strategy for versus adding a blood pressure medication right now.  We discussed numerous strategies including weight watchers program, intermittent fasting, weight management clinic referral, working with a trainer for nutrition and exercise support, and other strategies.  She wants to try Saxenda given the success from her sister. We will begin this medication along with continuing exercise, and I asked her to try going back on intermittent fasting again.  Discussed risks/benefits of medication, proper use of medication.   Mariah Ferrell was seen today for follow-up, hypertension and weight loss.  Diagnoses and all orders for this visit:  Weight gain  Elevated blood-pressure reading without diagnosis of hypertension  Chronic migraine without aura without status migrainosus, not intractable  Spent > 45 minutes face to face with patient in discussion of symptoms, evaluation, plan and recommendations.    f/u 52mo, pending call back

## 2019-09-11 NOTE — Telephone Encounter (Signed)
Hello Natonya  The study I mention does have a requirement of being diabetic which you are not thankfully.  So instead, begin Saxenda daily subcutaneous injection for help with efforts to lose weight.  Begin 0.6mg  daily or the first mark on the pen device at bedtime for the first week.  Increase to the next marking on the pen device at 1.2mg  daily for the next week.   By week 3 you can continue to increase the dose as long as no significant side effects such as major nausea.  By week 3 you can increase to 1.8mg  daily or the third marking on the pen device.  At week 4, increase to 3mg  daily which is the maximum dose.  Continue at the 3mg  dose or max dose.  If after the first week you already noticed nausea you can stay a each dose for 2 weeks at a time if needed to gradually titrate up slower. The regular or high as dose is 3 mg. But you can certainly start out as above or slower.  Continue efforts to exercise with moderate intensity exercise 150 minutes or more per week  Set short term goals for fitness.  Have a disciplined and careful diet.   Recheck in 1 month after starting Saxenda.

## 2019-09-12 ENCOUNTER — Other Ambulatory Visit: Payer: Self-pay | Admitting: Medical

## 2019-09-12 ENCOUNTER — Telehealth: Payer: Self-pay | Admitting: Medical

## 2019-09-12 MED ORDER — BD PEN NEEDLE NANO U/F 32G X 4 MM MISC: 1.0000 | Freq: Every day | 11 refills | Status: AC

## 2019-09-12 NOTE — Telephone Encounter (Signed)
Received fax from [facility]    Patient requests new rx for pen needles for [place]

## 2019-09-12 NOTE — Telephone Encounter (Signed)
Pt. Aware of your recommendations and will pick up Saxenda at the pharmacy once it is approved by her insurance company.

## 2019-09-20 ENCOUNTER — Telehealth: Payer: Self-pay | Admitting: Neurology

## 2019-09-20 NOTE — Telephone Encounter (Signed)
Patient called in wanting to find out if the Prior Authorization has been started for her Aimovig? She stated she needs to start the medication tomorrow.

## 2019-09-20 NOTE — Telephone Encounter (Signed)
Voicemail left, It takes a few days to get approval or denial.

## 2019-09-27 ENCOUNTER — Encounter: Payer: Self-pay | Admitting: Neurology

## 2019-09-27 NOTE — Progress Notes (Signed)
Talley Reichenberger Key: [id number] help? Call us at [phone] Outcome Additional Information Required Your PA has been resolved, no additional PA is required. For further inquiries please contact the number on the back of the member prescription card. (Message [phone]) Drug Bernita Raisin 100MG  tablets Form [employer] Electronic PA Form [phone] NCPDP)

## 2019-10-01 ENCOUNTER — Other Ambulatory Visit: Payer: Self-pay | Admitting: Neurology

## 2019-10-03 ENCOUNTER — Encounter: Payer: Self-pay | Admitting: Neurology

## 2019-10-03 NOTE — Progress Notes (Signed)
Per phone call with Mariah Ferrell- no PA Required for the Aimovig medication. Ref#: [id number].

## 2019-10-05 DIAGNOSIS — Z419 Encounter for procedure for purposes other than remedying health state, unspecified: Secondary | ICD-10-CM | POA: Diagnosis not present

## 2019-11-01 ENCOUNTER — Other Ambulatory Visit: Payer: Self-pay | Admitting: Medical

## 2019-11-01 MED ORDER — SAXENDA 18 MG/3ML ~~LOC~~ SOPN: 3.0000 mg | PEN_INJECTOR | Freq: Every day | SUBCUTANEOUS | 0 refills | Status: DC

## 2019-11-05 DIAGNOSIS — Z419 Encounter for procedure for purposes other than remedying health state, unspecified: Secondary | ICD-10-CM | POA: Diagnosis not present

## 2019-11-22 ENCOUNTER — Other Ambulatory Visit: Payer: Self-pay

## 2019-11-22 ENCOUNTER — Ambulatory Visit (INDEPENDENT_AMBULATORY_CARE_PROVIDER_SITE_OTHER): Payer: 59 | Admitting: Medical

## 2019-11-22 ENCOUNTER — Encounter: Payer: Self-pay | Admitting: Medical

## 2019-11-22 VITALS — BP 132/86 | HR 85 | Ht 63.0 in | Wt 208.6 lb

## 2019-11-22 DIAGNOSIS — Z832 Family history of diseases of the blood and blood-forming organs and certain disorders involving the immune mechanism: Secondary | ICD-10-CM

## 2019-11-22 DIAGNOSIS — Z8616 Personal history of COVID-19: Secondary | ICD-10-CM | POA: Insufficient documentation

## 2019-11-22 DIAGNOSIS — Z136 Encounter for screening for cardiovascular disorders: Secondary | ICD-10-CM

## 2019-11-22 DIAGNOSIS — G43709 Chronic migraine without aura, not intractable, without status migrainosus: Secondary | ICD-10-CM

## 2019-11-22 DIAGNOSIS — Z139 Encounter for screening, unspecified: Secondary | ICD-10-CM | POA: Insufficient documentation

## 2019-11-22 DIAGNOSIS — Z0000 Encounter for general adult medical examination without abnormal findings: Secondary | ICD-10-CM | POA: Insufficient documentation

## 2019-11-22 DIAGNOSIS — Z1159 Encounter for screening for other viral diseases: Secondary | ICD-10-CM

## 2019-11-22 DIAGNOSIS — K5909 Other constipation: Secondary | ICD-10-CM | POA: Diagnosis not present

## 2019-11-22 DIAGNOSIS — Z7185 Encounter for immunization safety counseling: Secondary | ICD-10-CM

## 2019-11-22 DIAGNOSIS — Z6836 Body mass index (BMI) 36.0-36.9, adult: Secondary | ICD-10-CM | POA: Insufficient documentation

## 2019-11-22 DIAGNOSIS — R0789 Other chest pain: Secondary | ICD-10-CM | POA: Insufficient documentation

## 2019-11-22 DIAGNOSIS — R7401 Elevation of levels of liver transaminase levels: Secondary | ICD-10-CM

## 2019-11-22 DIAGNOSIS — Z1589 Genetic susceptibility to other disease: Secondary | ICD-10-CM

## 2019-11-22 DIAGNOSIS — K9049 Malabsorption due to intolerance, not elsewhere classified: Secondary | ICD-10-CM

## 2019-11-22 DIAGNOSIS — Z79899 Other long term (current) drug therapy: Secondary | ICD-10-CM

## 2019-11-22 LAB — LIPID PANEL

## 2019-11-22 MED ORDER — SAXENDA 18 MG/3ML ~~LOC~~ SOPN: 3.0000 mg | PEN_INJECTOR | Freq: Every day | SUBCUTANEOUS | 2 refills | Status: DC

## 2019-11-22 MED ORDER — OMEPRAZOLE 40 MG PO CPDR
40.0000 mg | DELAYED_RELEASE_CAPSULE | Freq: Every day | ORAL | 1 refills | Status: DC
Start: 2019-11-22 — End: 2019-12-18

## 2019-11-22 NOTE — Progress Notes (Signed)
Subjective:   HPI  Mariah Ferrell is a 39 y.o. female who presents for Chief Complaint  Patient presents with  . Follow-up  . Gastroesophageal Reflux    Patient Care Team: Jatniel Verastegui, Camelia Eng, PA-C as PCP - General (Family Medicine) Pieter Partridge, DO as Consulting Physician (Neurology) Sees dentist Sees eye doctor Dr. Metta Clines, neurology Dr. Paula Compton, [facility] - prescribed the Wellbutrin and hydroxyzine  Concerns: obesity-Using Saxenda 3 mg.  Has lost 10 pounds of this so far but feels like she is at a plateau.  Exercising some.  She had Covid about a year ago.  At times she will get a burning in her chest with more cardio exercise.  Does not feel like she is back to full stamina.  Had jury duty recently and had the same chest burning up 3 flights of stairs   Reviewed their medical, surgical, family, social, medication, and allergy history and updated chart as appropriate.   Review of Systems Constitutional: -fever, -chills, -sweats, -unexpected weight change, -decreased appetite, -fatigue Allergy: -sneezing, -itching, -congestion Dermatology: -changing moles, --rash, -lumps ENT: -runny nose, -ear pain, -sore throat, -hoarseness, -sinus pain, -teeth pain, - ringing in ears, -hearing loss, -nosebleeds Cardiology: -chest pain, -palpitations, -swelling, -difficulty breathing when lying flat, -waking up short of breath Respiratory: -cough, -shortness of breath, -difficulty breathing with exercise or exertion, -wheezing, -coughing up blood Gastroenterology: -abdominal pain, -nausea, -vomiting, -diarrhea, -constipation, -blood in stool, -changes in bowel movement, -difficulty swallowing or eating Hematology: -bleeding, -bruising  Musculoskeletal: -joint aches, -muscle aches, -joint swelling, -back pain, -neck pain, -cramping, -changes in gait Ophthalmology: denies vision changes, eye redness, itching, discharge Urology: -burning with urination,  -difficulty urinating, -blood in urine, -urinary frequency, -urgency, -incontinence Neurology: -headache, -weakness, -tingling, -numbness, -memory loss, -falls, -dizziness Psychology: -depressed mood, -agitation, -sleep problems Breast/gyn: -breast tendnerss, -discharge, -lumps, -vaginal discharge,- irregular periods, -heavy periods     Objective:  BP 132/86   Pulse 85   Ht '5\' 3"'  (1.6 m)   Wt 208 lb 9.6 oz (94.6 kg)   SpO2 98%   BMI 36.95 kg/m   General appearance: alert, no distress, WD/WN, African American female Skin: unremarkable HEENT: normocephalic, conjunctiva/corneas normal, sclerae anicteric, PERRLA, EOMi, nares patent, no discharge or erythema, pharynx normal Oral cavity: MMM, tongue normal, teeth normal Neck: supple, no lymphadenopathy, no thyromegaly, no masses, normal ROM, no bruits Chest: non tender, normal shape and expansion Heart: RRR, normal S1, S2, no murmurs Lungs: CTA bilaterally, no wheezes, rhonchi, or rales Abdomen: +bs, soft, non tender, non distended, no masses, no hepatomegaly, no splenomegaly, no bruits Back: non tender, normal ROM, no scoliosis Musculoskeletal: upper extremities non tender, no obvious deformity, normal ROM throughout, lower extremities non tender, no obvious deformity, normal ROM throughout Extremities: no edema, no cyanosis, no clubbing Pulses: 2+ symmetric, upper and lower extremities, normal cap refill Neurological: alert, oriented x 3, CN2-12 intact, strength normal upper extremities and lower extremities, sensation normal throughout, DTRs 2+ throughout, no cerebellar signs, gait normal Psychiatric: normal affect, behavior normal, pleasant  Breast/gyn/rectal - deferred to gyn    Assessment and Plan :   Encounter Diagnoses  Name Primary?  . Encounter for health maintenance examination in adult Yes  . Chronic migraine without aura without status migrainosus, not intractable   . Chronic constipation   . Family history of  autoimmune disorder   . High risk medication use   . Food intolerance   . HLA B27 (HLA B27 positive)   .  Screening for heart disease   . Transaminitis   . Chest discomfort   . History of COVID-19   . Vaccine counseling   . BMI 36.0-36.9,adult   . Encounter for hepatitis C screening test for low risk patient   . Screening for condition     Physical exam - discussed and counseled on healthy lifestyle, diet, exercise, preventative care, vaccinations, sick and well care, proper use of emergency dept and after hours care, and addressed their concerns.    Health screening: Advised they see their eye doctor yearly for routine vision care. Advised they see their dentist yearly for routine dental care including hygiene visits twice yearly. See your gynecologist yearly for routine gynecological care.  Cancer screening Counseled on self breast exams, mammograms, cervical cancer screening  Colonoscopy:  Consider baseline colonoscopy given the constipation   Vaccinations: We will request copy of your recent covid and flu shot You are due for updated tetanus booster   Separate significant issues discussed: Continue efforts at weight loss through healthy diet and exercise and Saxenda.  She is using the [employer] app as well with her prescription coverage for Saxenda  Migraines-managed by neurology  Transaminitis in the past-repeat labs today  Chest discomfort-EKG reviewed, follow-up pending labs  Chronic constipation-consider GI consult, continue good water and fiber intake   Kajah was seen today for follow-up and gastroesophageal reflux.  Diagnoses and all orders for this visit:  Encounter for health maintenance examination in adult -     Comprehensive metabolic panel -     CBC with Differential/Platelet -     Lipid panel -     Hemoglobin A1c -     TSH -     EKG 12-Lead -     Cancel: HIV-1 RNA quant-no reflex-bld -     Hepatitis C antibody -     Hepatitis B surface  antibody,quantitative -     HIV Antibody (routine testing w rflx)  Chronic migraine without aura without status migrainosus, not intractable  Chronic constipation -     TSH  Family history of autoimmune disorder  High risk medication use -     Comprehensive metabolic panel -     CBC with Differential/Platelet  Food intolerance  HLA B27 (HLA B27 positive)  Screening for heart disease -     EKG 12-Lead  Transaminitis  Chest discomfort -     Comprehensive metabolic panel -     CBC with Differential/Platelet  History of COVID-19  Vaccine counseling  BMI 36.0-36.9,adult  Encounter for hepatitis C screening test for low risk patient -     Hepatitis C antibody  Screening for condition -     Hepatitis B surface antibody,quantitative  Other orders -     omeprazole (PRILOSEC) 40 MG capsule; Take 1 capsule (40 mg total) by mouth daily. -     Liraglutide -Weight Management (SAXENDA) 18 MG/3ML SOPN; Inject 3 mg into the skin daily.    Follow-up pending labs, yearly for physical

## 2019-11-22 NOTE — Patient Instructions (Signed)
Preventative Care for Adults - Female   Thank you for coming in for your well visit today, and thank you for trusting [place] with your care!  If you had a good experience today, please complete the surveys sent by [facility] and consider a review online such as [employer] or Health Grades, refer [place] to a friend  Maintain regular health and wellness exams:  A routine yearly physical is a good way to check in with your primary care provider about your health and preventive screening. It is also an opportunity to share updates about your health and any concerns you have, and receive a thorough all-over exam.   Most health insurance companies pay for at least some preventative services.  Check with your health plan for specific coverages.  What preventative services do women need?  Adult women should have their weight and blood pressure checked regularly.   Women age 23 and older should have their cholesterol levels checked regularly.  Women should be screened for cervical cancer with a Pap smear and pelvic exam beginning at either age 62, or 3 years after they become sexually activity.    Breast cancer screening generally begins at age 56 with a mammogram and breast exam by your primary care provider.    Beginning at age 52 and continuing to age 25, women should be screened for colorectal cancer.  Certain people may need continued testing until age 3.  Updating vaccinations is part of preventative care.  Vaccinations help protect against diseases such as the flu.  Osteoporosis is a disease in which the bones lose minerals and strength as we age. Women ages 1 and over should discuss this with their caregivers, as should women after menopause who have other risk factors.  Lab tests are generally done as part of preventative care to screen for anemia and blood disorders, to screen for problems with the kidneys and liver, to screen for bladder problems, to check blood sugar, and to check your  cholesterol level.  Preventative services generally include counseling about diet, exercise, avoiding tobacco, drugs, excessive alcohol consumption, and sexually transmitted infections.    Xrays and CT scans are not normally done as a preventative test, and most insurances do not pay for imaging for screening other than as discussed under cancer screens below.   On the other hand, if you have certain medical concerns, imaging may be necessary as a diagnostic test.   Your Medical Team Your medical team starts with [place], your PCP or primary care provider.  Please use our services for your routine care such as physicals, screenings, immunizations, sick visits, and your first stop for general medical concerns.  You can call our number for after hours information for urgent questions that may need attention but cannot wait til the next business day.    Urgent care-urgent cares exist to provide care when your primary care office would typically be closed such as evenings or weekends.   Urgent care is for evaluation of urgent medical problems that do not necessarily require emergency department care, but cannot wait til the next business day when we are open.  Emergency department care-please reserve emergency department care for serious, urgent, possibly life-threatening medical problems.  This includes issues like possible stroke, heart attack, significant injury, mental health crisis, or other urgent need that requires immediate medical attention.     See your dentist office twice yearly for hygiene and cleaning visits.   Brush your teeth and floss your teeth daily.  See your eye doctor yearly for routine eye exam and screenings for glaucoma and retinal disease.  See your gynecologist yearly if you do not have your female/gynecological exams at our office.     Vaccines:  Stay up to date with your tetanus shots and other required immunizations. You should have a booster for tetanus every 10 years. Be  sure to get your flu shot every year, since 5%-20% of the [place] population comes down with the flu. The flu vaccine changes each year, so being vaccinated once is not enough. Get your shot in the fall, before the flu season peaks.   Other vaccines to consider:  Pneumococcal vaccine to protect against certain types of pneumonia.  This is normally recommended for adults age 35 or older.  However, adults younger than 40 years old with certain underlying conditions such as diabetes, heart or lung disease should also receive the vaccine.  Shingles vaccine to protect against Varicella Zoster if you are older than age 45, or younger than 40 years old with certain underlying illness.  If you have not had the Shingrix vaccine, please call your insurer to inquire about coverage for the Shingrix vaccine given in 2 doses.   Some insurers cover this vaccine after age 49, some cover this after age 25.  If your insurer covers this, then call to schedule appointment to have this vaccine here  Hepatitis A vaccine to protect against a form of infection of the liver by a virus acquired from food.  Hepatitis B vaccine to protect against a form of infection of the liver by a virus acquired from blood or body fluids, particularly if you work in health care.  If you plan to travel internationally, check with your local health department for specific vaccination recommendations.  Human Papilloma Virus or HPV causes cancer of the cervix, and other infections that can be transmitted from person to person. There is a vaccine for HPV, and males should get immunized between the ages of 37 and 66. It requires a series of 3 shots.   Covid/Coronavirus - Please consider vaccination for your benefit and to help prevent spread of Covid to those around you.       What should I know about Cancer screening? Many types of cancers can be detected early and may often be prevented. Lung Cancer  You should be screened every year for  lung cancer if: ? You are a current smoker who has smoked for at least 30 years. ? You are a former smoker who has quit within the past 15 years.  Talk to your health care provider about your screening options, when you should start screening, and how often you should be screened.  Breast cancer screening is essential to preventive care for women. All women age 53 and older should perform a breast self-exam every month. At age 44 and older, women should have their caregiver complete a breast exam each year. Women at ages 40 and older should have a mammogram (x-ray film) of the breasts. Your caregiver can discuss how often you need mammograms.    Breast cancer screening   [facility]   Or    [facility]   [phone]         [phone] [address]       [address] [place], [place] [address]        [place], [place] [address]  Cervical cancer screening includes taking a Pap  smear (sample of cells examined under a microscope) from the cervix (end of the uterus). It also includes testing for HPV (Human Papilloma Virus, which can cause cervical cancer). Screening and a pelvic exam should begin at age 40, or 3 years after a woman becomes sexually active. Screening should occur every year, with a Pap smear but no HPV testing, up to age 40. After age 40, you should have a Pap smear every 3 years with HPV testing, if no HPV was found previously.   Colorectal Cancer  Routine colorectal cancer screening usually begins at 40 years of age and should be repeated every 5-10 years until you are 40 years old. You may need to be screened more often if early forms of precancerous polyps or small growths are found. Your health care provider may recommend screening at an earlier age if you have risk factors for colon cancer.  Your health care provider may recommend using home test kits to check for hidden blood in the stool.  A small camera at the  end of a tube can be used to examine your colon (sigmoidoscopy or colonoscopy). This checks for the earliest forms of colorectal cancer.  Skin Cancer  Check your skin from head to toe regularly.  Tell your health care provider about any new moles or changes in moles, especially if: ? There is a change in a mole's size, shape, or color. ? You have a mole that is larger than a pencil eraser.  Always use sunscreen. Apply sunscreen liberally and repeat throughout the day.  Protect yourself by wearing long sleeves, pants, a wide-brimmed hat, and sunglasses when outside.     GENERAL RECOMMENDATIONS FOR GOOD HEALTH:  Healthy diet:  Eat a variety of foods, including fruit, vegetables, animal or vegetable protein, such as meat, fish, chicken, and eggs, or beans, lentils, tofu, and grains, such as rice.  Drink plenty of water daily.  Decrease saturated fat in the diet, avoid lots of red meat, processed foods, sweets, fast foods, and fried foods.  Exercise:  Aerobic exercise helps maintain good heart health. At least 30-40 minutes of moderate-intensity exercise is recommended. For example, a brisk walk that increases your heart rate and breathing. This should be done on most days of the week.   Find a type of exercise or a variety of exercises that you enjoy so that it becomes a part of your daily life.  Examples are running, walking, swimming, water aerobics, and biking.  For motivation and support, explore group exercise such as aerobic class, spin class, Zumba, Yoga,or  martial arts, etc.    Set exercise goals for yourself, such as a certain weight goal, walk or run in a race such as a 5k walk/run.  Speak to your primary care provider about exercise goals.    Disease prevention:  If you smoke or chew tobacco, find out from your caregiver how to quit. It can literally save your life, no matter how long you have been a tobacco user. If you do not use tobacco, never begin.   Maintain a  healthy diet and normal weight. Increased weight leads to problems with blood pressure and diabetes.   The Body Mass Index or BMI is a way of measuring how much of your body is fat. Having a BMI above 27 increases the risk of heart disease, diabetes, hypertension, stroke and other problems related to obesity. Your caregiver can help determine your BMI and based on it develop an exercise and dietary program  to help you achieve or maintain this important measurement at a healthful level.  High blood pressure causes heart and blood vessel problems.  Persistent high blood pressure should be treated with medicine if weight loss and exercise do not work.    Fat and cholesterol leaves deposits in your arteries that can block them. This causes heart disease and vessel disease elsewhere in your body.  If your cholesterol is found to be high, or if you have heart disease or certain other medical conditions, then you may need to have your cholesterol monitored frequently and be treated with medication.   Ask if you should have a cardiac stress test if your history suggests this. A stress test is a test done on a treadmill that looks for heart disease. This test can find disease prior to there being a problem.  Menopause can be associated with physical symptoms and risks. Hormone replacement therapy is available to decrease these. You should talk to your caregiver about whether starting or continuing to take hormones is right for you.   Osteoporosis is a disease in which the bones lose minerals and strength as we age. This can result in serious bone fractures. Risk of osteoporosis can be identified using a bone density scan. Women ages 57 and over should discuss this with their caregivers, as should women after menopause who have other risk factors. Ask your caregiver whether you should be taking a calcium supplement and Vitamin D, to reduce the rate of osteoporosis.   Osteoporosis screening/bone density  testing:  [facility]   Or    [facility]   [phone]          [phone] [address]       [address] [place], [place] [address]        [place], [place] [address]    Avoid drinking alcohol in excess (more than two drinks per day).  Avoid use of street drugs. Do not share needles with anyone. Ask for professional help if you need assistance or instructions on stopping the use of alcohol, cigarettes, and/or drugs.  Brush your teeth twice a day with fluoride toothpaste, and floss once a day. Good oral hygiene prevents tooth decay and gum disease. The problems can be painful, unattractive, and can cause other health problems. Visit your dentist for a routine oral and dental check up and preventive care every 6-12 months.    Spiritual and Emotional Health Keeping a healthy spiritual life can help you better manage your physical health. Your spiritual life can help you to cope with any issues that may arise with your physical health.  Balance can keep [place] healthy and help [place] to recover.  If you are struggling with your spiritual health there are questions that you may want to ask yourself:  What makes me feel most complete? When do I feel most connected to the rest of the world? Where do I find the most inner strength? What am I doing when I feel whole?  Helpful tips: . Being in nature. Some people feel very connected and at peace when they are walking outdoors or are outside. Marland Kitchen Helping others. Some feel the largest sense of wellbeing when they are of service to others. Being of service can take on many forms. It can be doing volunteer work, being kind to strangers, or offering a hand to a friend in need. . Gratitude. Some people find they feel the most connected when  they remain grateful. They may make lists of all the things they are grateful for or say a thank you out loud for all they have.    Emotional Health Are you  in tune with your emotional health?  Check out this link: http://www.marquez-love.com/    Legal  Take the time to do a last will and testament, Advanced Directives including Health Care Power of Attorney and Living Will documents.  Don't leave your family with burdens that can be handled ahead of time.   Financial Health . Make sure you use a budget for your personal finances . Make sure you are insured against risks (health insurance, life insurance, auto insurance, etc) . Save more, spend less . Set financial goals . If you need help in this area, good resources include counseling through [employer] or other community resources, have a meeting with a Social research officer, government, and a good resource is [employer] 10 reasons people come to the doctor's office:   (what is your "ounce of prevention")  Skin disorders; Osteoarthritis and joint disorders; Back problems; Cholesterol problems; Upper respiratory conditions, excluding asthma; Anxiety, depression, and bipolar disorder; Chronic neurologic disorders; High blood pressure; Headaches and migraines; and Diabetes.    Safety:  Use seatbelts 100% of the time, whether driving or as a passenger.  Use safety devices such as hearing protection if you work in environments with loud noise or significant background noise.  Use safety glasses when doing any work that could send debris in to the eyes.  Use a helmet if you ride a bike or motorcycle.  Use appropriate safety gear for contact sports.  Talk to your caregiver about gun safety.  Use sunscreen with a SPF (or skin protection factor) of 15 or greater.  Lighter skinned people are at a greater risk of skin cancer. Don't forget to also wear sunglasses in order to protect your eyes from too much damaging sunlight. Damaging sunlight can accelerate cataract formation.   Keep carbon monoxide and smoke detectors in your home functioning at all times. Change the  batteries every 6 months or use a model that plugs into the wall.    Sexual activity: . Sex is a normal part of life and sexual activity can continue into older adulthood for many healthy people.   . If you are having issues related to sexual activity, please follow up to discuss this further.   . If you are not in a monogamous relationship or have more than one partner, please practice safe sex.  Use condoms. Condoms are used for birth control and to help reduce the spread of sexually transmitted infections (or STIs).  Some of the STIs are gonorrhea (the clap), chlamydia, syphilis, trichomonas, herpes, HPV (human papilloma virus) and HIV (human immunodeficiency virus) which causes AIDS. The herpes, HIV and HPV are viral illnesses that have no cure. These can result in disability, cancer and death.   We are able to test for STIs here at our office.

## 2019-11-23 LAB — COMPREHENSIVE METABOLIC PANEL
ALT: 11 IU/L (ref 0–32)
AST: 18 IU/L (ref 0–40)
Albumin/Globulin Ratio: 1.6 (ref 1.2–2.2)
Albumin: 4.2 g/dL (ref 3.8–4.8)
Alkaline Phosphatase: 98 IU/L (ref 44–121)
BUN/Creatinine Ratio: 9 (ref 9–23)
BUN: 8 mg/dL (ref 6–24)
Bilirubin Total: 0.4 mg/dL (ref 0.0–1.2)
CO2: 24 mmol/L (ref 20–29)
Calcium: 9.3 mg/dL (ref 8.7–10.2)
Chloride: 101 mmol/L (ref 96–106)
Creatinine, Ser: 0.87 mg/dL (ref 0.57–1.00)
GFR calc Af Amer: 96 mL/min/{1.73_m2} (ref >59)
GFR calc non Af Amer: 84 mL/min/{1.73_m2} (ref >59)
Globulin, Total: 2.7 g/dL (ref 1.5–4.5)
Glucose: 77 mg/dL (ref 65–99)
Potassium: 4.2 mmol/L (ref 3.5–5.2)
Sodium: 138 mmol/L (ref 134–144)
Total Protein: 6.9 g/dL (ref 6.0–8.5)

## 2019-11-23 LAB — CBC WITH DIFFERENTIAL/PLATELET
Basophils Absolute: 0 10*3/uL (ref 0.0–0.2)
Basos: 1 %
EOS (ABSOLUTE): 0.1 10*3/uL (ref 0.0–0.4)
Eos: 2 %
Hematocrit: 41.5 % (ref 34.0–46.6)
Hemoglobin: 13.4 g/dL (ref 11.1–15.9)
Immature Grans (Abs): 0 10*3/uL (ref 0.0–0.1)
Immature Granulocytes: 0 %
Lymphocytes Absolute: 2.2 10*3/uL (ref 0.7–3.1)
Lymphs: 33 %
MCH: 26.7 pg (ref 26.6–33.0)
MCHC: 32.3 g/dL (ref 31.5–35.7)
MCV: 83 fL (ref 79–97)
Monocytes Absolute: 0.4 10*3/uL (ref 0.1–0.9)
Monocytes: 6 %
Neutrophils Absolute: 3.8 10*3/uL (ref 1.4–7.0)
Neutrophils: 58 %
Platelets: 319 10*3/uL (ref 150–450)
RBC: 5.02 x10E6/uL (ref 3.77–5.28)
RDW: 13 % (ref 11.7–15.4)
WBC: 6.5 10*3/uL (ref 3.4–10.8)

## 2019-11-23 LAB — HIV ANTIBODY (ROUTINE TESTING W REFLEX): HIV Screen 4th Generation wRfx: NONREACTIVE

## 2019-11-23 LAB — LIPID PANEL
Chol/HDL Ratio: 2.7 ratio (ref 0.0–4.4)
Cholesterol, Total: 146 mg/dL (ref 100–199)
HDL: 55 mg/dL (ref >39)
LDL Chol Calc (NIH): 79 mg/dL (ref 0–99)
Triglycerides: 56 mg/dL (ref 0–149)
VLDL Cholesterol Cal: 12 mg/dL (ref 5–40)

## 2019-11-23 LAB — HEPATITIS B SURFACE ANTIBODY, QUANTITATIVE: Hepatitis B Surf Ab Quant: >1000 m[IU]/mL (ref >9.9)

## 2019-11-23 LAB — TSH: TSH: 1 u[IU]/mL (ref 0.450–4.500)

## 2019-11-23 LAB — HEPATITIS C ANTIBODY: Hep C Virus Ab: 0.2 s/co ratio (ref 0.0–0.9)

## 2019-11-23 LAB — HEMOGLOBIN A1C
Est. average glucose Bld gHb Est-mCnc: 103 mg/dL
Hgb A1c MFr Bld: 5.2 % (ref 4.8–5.6)

## 2019-12-05 DIAGNOSIS — Z419 Encounter for procedure for purposes other than remedying health state, unspecified: Secondary | ICD-10-CM | POA: Diagnosis not present

## 2019-12-12 ENCOUNTER — Other Ambulatory Visit: Payer: Self-pay | Admitting: Medical

## 2019-12-12 MED ORDER — SAXENDA 18 MG/3ML ~~LOC~~ SOPN: 3.0000 mg | PEN_INJECTOR | Freq: Every day | SUBCUTANEOUS | 3 refills | Status: DC

## 2019-12-12 NOTE — Telephone Encounter (Signed)
Pt called checking the status of this message.

## 2019-12-18 ENCOUNTER — Other Ambulatory Visit: Payer: Self-pay | Admitting: Medical

## 2020-01-05 DIAGNOSIS — Z419 Encounter for procedure for purposes other than remedying health state, unspecified: Secondary | ICD-10-CM | POA: Diagnosis not present

## 2020-01-22 ENCOUNTER — Other Ambulatory Visit: Payer: Self-pay | Admitting: Medical

## 2020-01-22 ENCOUNTER — Other Ambulatory Visit: Payer: Self-pay | Admitting: Neurology

## 2020-01-23 NOTE — Telephone Encounter (Signed)
Received request from pharmacy for PA on aimovig; submitted to ins on 01/22/20. This is still currently pending. May take up to 3 days. I will let you know what insurance says. I will let pharm know we are still waiting on PA decision. Thanks!

## 2020-01-25 ENCOUNTER — Telehealth: Payer: Self-pay | Admitting: Neurology

## 2020-01-25 NOTE — Telephone Encounter (Signed)
I don't think so. We will have to wait til Monday when Dr.Jaffe get back in the office. Do you need me to call the patient and pharmacy?

## 2020-01-25 NOTE — Telephone Encounter (Signed)
I already let them know we are waiting to see what Dr. Everlena Cooper wants to do.

## 2020-01-25 NOTE — Telephone Encounter (Signed)
Received call from Danielle with patient's insurance. They are denying the Aimovig because it is not longer a covered medication for 2022. Patient has failed Emgality but has not tried Ajovy. They are needing patient to try and fail Ajovy before they can do approval for the Aimovig.   She was going to send me the denial letter. Is there a reason why she can't try the Ajovy or can we switch to that?

## 2020-01-28 ENCOUNTER — Other Ambulatory Visit: Payer: Self-pay | Admitting: Neurology

## 2020-01-28 MED ORDER — AJOVY 225 MG/1.5ML ~~LOC~~ SOAJ: 225.0000 mg | SUBCUTANEOUS | 5 refills | Status: DC

## 2020-01-28 NOTE — Telephone Encounter (Signed)
Done

## 2020-01-28 NOTE — Telephone Encounter (Signed)
Patient called to check on the status of her prior authorization. She states she has not heard anything and she has been getting headaches daily for the past week. She is asking for a call back, but states if she doesn't answer, it is ok to leave a detailed message on her cell number.

## 2020-01-28 NOTE — Telephone Encounter (Signed)
Per Pt she would like to try Ajovy.   [facility]

## 2020-01-29 NOTE — Telephone Encounter (Signed)
Submitted PA for the Ajovy- pending!  Dorinne Zeiser (Key: [id number])Need help? Call us at [phone] Outcome N/A Next Steps The plan will fax you a determination, typically within 1 to 5 business days.  How do I follow up? Drug AJOVY (fremanezumab-vfrm) injection 225MG /1.5ML auto-injectors Form [employer] General Prescription Drug Coverage Determination Form Prior Authorization for Medicare Members [phone] [phone]

## 2020-02-05 DIAGNOSIS — Z419 Encounter for procedure for purposes other than remedying health state, unspecified: Secondary | ICD-10-CM | POA: Diagnosis not present

## 2020-02-07 ENCOUNTER — Encounter: Payer: Self-pay | Admitting: Neurology

## 2020-02-07 NOTE — Progress Notes (Signed)
Per insurance no PA required for the Ajovy since it is a covered drug.

## 2020-02-18 ENCOUNTER — Other Ambulatory Visit: Payer: Self-pay | Admitting: Medical

## 2020-03-04 DIAGNOSIS — Z419 Encounter for procedure for purposes other than remedying health state, unspecified: Secondary | ICD-10-CM | POA: Diagnosis not present

## 2020-03-12 ENCOUNTER — Other Ambulatory Visit: Payer: Self-pay | Admitting: Medical

## 2020-03-24 ENCOUNTER — Other Ambulatory Visit: Payer: Self-pay

## 2020-03-24 MED ORDER — SAXENDA 18 MG/3ML ~~LOC~~ SOPN: 3.0000 mg | PEN_INJECTOR | Freq: Every day | SUBCUTANEOUS | 2 refills | Status: DC

## 2020-04-04 DIAGNOSIS — Z419 Encounter for procedure for purposes other than remedying health state, unspecified: Secondary | ICD-10-CM | POA: Diagnosis not present

## 2020-04-10 ENCOUNTER — Encounter: Payer: Self-pay | Admitting: Medical

## 2020-04-10 ENCOUNTER — Other Ambulatory Visit: Payer: Self-pay

## 2020-04-10 ENCOUNTER — Other Ambulatory Visit: Payer: Self-pay | Admitting: Medical

## 2020-04-10 ENCOUNTER — Ambulatory Visit (INDEPENDENT_AMBULATORY_CARE_PROVIDER_SITE_OTHER): Payer: 59 | Admitting: Medical

## 2020-04-10 VITALS — HR 88 | Ht 63.0 in | Wt 207.2 lb

## 2020-04-10 DIAGNOSIS — Z79899 Other long term (current) drug therapy: Secondary | ICD-10-CM | POA: Diagnosis not present

## 2020-04-10 DIAGNOSIS — K5909 Other constipation: Secondary | ICD-10-CM

## 2020-04-10 DIAGNOSIS — Z6836 Body mass index (BMI) 36.0-36.9, adult: Secondary | ICD-10-CM

## 2020-04-10 DIAGNOSIS — G43709 Chronic migraine without aura, not intractable, without status migrainosus: Secondary | ICD-10-CM

## 2020-04-10 MED ORDER — SAXENDA 18 MG/3ML ~~LOC~~ SOPN: 3.0000 mg | PEN_INJECTOR | Freq: Every day | SUBCUTANEOUS | 2 refills | Status: DC

## 2020-04-10 MED ORDER — RYBELSUS 7 MG PO TABS: 1.0000 | ORAL_TABLET | Freq: Every day | ORAL | 1 refills | Status: DC

## 2020-04-10 NOTE — Patient Instructions (Signed)
Consider checking with your insurance about the following options similar to [place]   Ozempic weekly injection Trulicity weekly injection Wegovy weekly injection Rybelsus oral tablet daily  The Wegovy is for weight management but has been on national backorder   The other 3 options above are technically diabetic medications, so your insurance may not cover these for weight management/weight loss

## 2020-04-10 NOTE — Progress Notes (Signed)
Subjective: Chief Complaint  Patient presents with  . Medication Management    Patient Care Team: Valta Dillon, Kermit Balo, PA-C as PCP - General (Family Medicine) Drema Dallas, DO as Consulting Physician (Neurology) Sees dentist Sees eye doctor Dr. Shon Millet, neurology Dr. Huel Cote[facility] - prescribed the Wellbutrin and hydroxyzine   Here for med check.  Using Saxenda regularly since last visit.  Misses some doses.  Going between 2.4 and 3mg .   Trying to get to goal weight at 185lb.  Exercise - using beach body program, subscription.  Doing exercise with videos, HIT.  No side effects with Saxenda, no nausea.    Eating habits currently - less calories, better choices overall, smaller portion sizes.  Not eating after 6-7 pm at night, more water.    Still taking Wellbutrin for apathy and mood.  Has hx/o mood depressed in winter months.  Has motivation now.    Headaches lately been ok.   Has had some sinus congestion and allergies of late, started some Claritin recently.   Using Ajovy monthly.   This works better than Aimovig prior.   Uses Ubrelvy or Maxalt as needed.   Past Medical History:  Diagnosis Date  . IUD (intrauterine device) in place    Mirena  . Migraines    Current Outpatient Medications on File Prior to Visit  Medication Sig Dispense Refill  . buPROPion (WELLBUTRIN XL) 150 MG 24 hr tablet Take 150 mg by mouth 2 (two) times daily.    . cyclobenzaprine (FLEXERIL) 10 MG tablet Take 1 tablet (10 mg total) by mouth 3 (three) times daily as needed for muscle spasms. 30 tablet 5  . estradiol (ESTRACE) 0.5 MG tablet Take 0.5 mg by mouth daily.    03[birth year]1985 levonorgestrel (MIRENA) 20 MCG/24HR IUD 1 each by Intrauterine route once. June 2016    . AJOVY 225 MG/1.5ML SOSY INJECT 1 SYRINGE EVERY 28 DAYS INTO THE SKIN 1.5 mL 5  . Biotin 10 MG TABS Take by mouth. (Patient not taking: No sig reported)    . Insulin Pen Needle (BD PEN NEEDLE NANO U/F) 32G X 4 MM  MISC 1 each by Does not apply route at bedtime. 100 each 11  . omeprazole (PRILOSEC) 40 MG capsule TAKE 1 CAPSULE BY MOUTH EVERY DAY 30 capsule 0  . promethazine (PHENERGAN) 25 MG tablet Take 25 mg by mouth every 8 (eight) hours as needed.    . rizatriptan (MAXALT) 10 MG tablet TAKE 1 TABLET AS NEEDED FOR MIGRAINE. MAY REPEAT IN 2 HOURS IF NEEDED. MAX 2 TABS/24HRS 10 tablet 3  . senna (SENOKOT) 8.6 MG TABS tablet Take 1 tablet by mouth.    . UBRELVY 100 MG TABS TAKE 1 TABLET BY MOUTH AS NEEDED. MAY REPEAT AFTER 2 HOURS IF NEEDED. MAXIMUM 2 TABLETS IN 24HRS 30 tablet 3  . zonisamide (ZONEGRAN) 25 MG capsule Take 1 capsule daily for one week, then 2 capsules daily for one week, then 3 capsules daily for one week, then 4 capsules daily. (Patient not taking: No sig reported) 120 capsule 0   No current facility-administered medications on file prior to visit.   ROS as in subjective   Objective: Pulse 88   Ht 5\' 3"  (1.6 m)   Wt 207 lb 3.2 oz (94 kg)   SpO2 98%   BMI 36.70 kg/m   BP Readings from Last 3 Encounters:  11/22/19 132/86  09/11/19 120/82  09/27/18 123/87   Wt Readings from Last  3 Encounters:  04/10/20 207 lb 3.2 oz (94 kg)  11/22/19 208 lb 9.6 oz (94.6 kg)  09/11/19 219 lb 3.2 oz (99.4 kg)    Gen: wd, wn, nad Heart rrr, normal s1, s2, no murmurs Lungs clear Ext no edema    Assessment: Encounter Diagnoses  Name Primary?  Marland Kitchen BMI 36.0-36.9,adult Yes  . High risk medication use   . Chronic constipation   . Chronic migraine without aura without status migrainosus, not intractable      Plan: Miri was seen today for medication management.  Diagnoses and all orders for this visit:  BMI 36.0-36.9,adult - congratulated her on her weight loss progress.  C/t saxenda, counseled on eating habits, goal setting, aerobic and weight bearing exercise.  F/u 59mo for weight check  High risk medication use  Chronic constipation - no recent problems.    Chronic migraine  without aura without status migrainosus, not intractable - good headache control of late, sees specialist  Other orders -     Liraglutide -Weight Management (SAXENDA) 18 MG/3ML SOPN; Inject 3 mg into the skin daily.   F/u for fasting physical, 22mo weight check

## 2020-04-15 ENCOUNTER — Other Ambulatory Visit: Payer: Self-pay | Admitting: Medical

## 2020-05-04 DIAGNOSIS — Z419 Encounter for procedure for purposes other than remedying health state, unspecified: Secondary | ICD-10-CM | POA: Diagnosis not present

## 2020-05-07 ENCOUNTER — Telehealth: Payer: Self-pay | Admitting: Neurology

## 2020-05-07 MED ORDER — UBRELVY 100 MG PO TABS: ORAL_TABLET | ORAL | 3 refills | Status: DC

## 2020-05-07 NOTE — Telephone Encounter (Signed)
Patient would like to know if we can send in a refill on the [facility]relvy  [facility] [address]   She has made appt to see Dr Everlena Cooper on 08-21-20

## 2020-05-07 NOTE — Telephone Encounter (Signed)
Script sent into the pharmacy.  

## 2020-05-15 ENCOUNTER — Other Ambulatory Visit: Payer: Self-pay | Admitting: Medical

## 2020-06-03 ENCOUNTER — Encounter: Payer: Self-pay | Admitting: Family Medicine

## 2020-06-03 ENCOUNTER — Ambulatory Visit (INDEPENDENT_AMBULATORY_CARE_PROVIDER_SITE_OTHER): Payer: 59 | Admitting: Family Medicine

## 2020-06-03 ENCOUNTER — Other Ambulatory Visit: Payer: Self-pay

## 2020-06-03 VITALS — BP 128/80 | HR 91 | Temp 98.2°F | Wt 211.6 lb

## 2020-06-03 DIAGNOSIS — R6884 Jaw pain: Secondary | ICD-10-CM

## 2020-06-03 NOTE — Progress Notes (Signed)
   Subjective:    Patient ID: Mariah Ferrell, female    DOB: 1980-02-11, 40 y.o.   MRN: [id number]  HPI She complains of a 4-day history of right jaw discomfort.  She consents to certain things and have no trouble but others will cause some discomfort but not necessarily related to thickness of the material.  No earache, sore throat.   Review of Systems     Objective:   Physical Exam Alert and in no distress.  Neck exam shows no adenopathy or palpable tenderness.  TMJ is nontender.  Exam of the mouth shows no lesions.  No palpable tenderness to the anterior mouth or teeth.       Assessment & Plan:  Jaw pain I explained that this possibly could be TMJ but is not clear-cut.  Recommend 800 mg of ibuprofen 3 times per day and pay attention to any particular foods or consistency of the food to see if that makes a difference.

## 2020-06-04 DIAGNOSIS — Z419 Encounter for procedure for purposes other than remedying health state, unspecified: Secondary | ICD-10-CM | POA: Diagnosis not present

## 2020-07-04 DIAGNOSIS — Z419 Encounter for procedure for purposes other than remedying health state, unspecified: Secondary | ICD-10-CM | POA: Diagnosis not present

## 2020-07-11 ENCOUNTER — Telehealth: Payer: Self-pay | Admitting: Neurology

## 2020-07-14 NOTE — Progress Notes (Signed)
Per fax received pt has Primary insurance Please send to that plan.

## 2020-07-14 NOTE — Progress Notes (Unsigned)
Mariah Ferrell (Key: [id number])  [employer] is unable to respond with clinical questions. Please see more information at the bottom of the page for next steps.   Your PA has been resolved, no additional PA is required. For further inquiries please contact the number on the back of the member prescription card. (Message [phone])

## 2020-07-15 ENCOUNTER — Other Ambulatory Visit: Payer: Self-pay | Admitting: Neurology

## 2020-07-18 ENCOUNTER — Other Ambulatory Visit: Payer: Self-pay | Admitting: Medical

## 2020-07-18 MED ORDER — OZEMPIC (1 MG/DOSE) 4 MG/3ML ~~LOC~~ SOPN: 1.0000 mg | PEN_INJECTOR | SUBCUTANEOUS | 0 refills | Status: DC

## 2020-08-04 DIAGNOSIS — Z419 Encounter for procedure for purposes other than remedying health state, unspecified: Secondary | ICD-10-CM | POA: Diagnosis not present

## 2020-08-06 ENCOUNTER — Other Ambulatory Visit: Payer: Self-pay

## 2020-08-06 ENCOUNTER — Ambulatory Visit (INDEPENDENT_AMBULATORY_CARE_PROVIDER_SITE_OTHER): Payer: 59 | Admitting: Medical

## 2020-08-06 VITALS — BP 120/80 | HR 83 | Ht 63.0 in | Wt 211.2 lb

## 2020-08-06 DIAGNOSIS — G43709 Chronic migraine without aura, not intractable, without status migrainosus: Secondary | ICD-10-CM | POA: Diagnosis not present

## 2020-08-06 DIAGNOSIS — Z6837 Body mass index (BMI) 37.0-37.9, adult: Secondary | ICD-10-CM | POA: Insufficient documentation

## 2020-08-06 DIAGNOSIS — Z79899 Other long term (current) drug therapy: Secondary | ICD-10-CM

## 2020-08-06 NOTE — Progress Notes (Signed)
Subjective: Chief Complaint  Patient presents with   follow-up on weight loss    Follow-up on weight loss, no concerns   Patient Care Team: Rashana Andrew, Kermit Balo, PA-C as PCP - General (Family Medicine) Drema Dallas, DO as Consulting Physician (Neurology) Sees dentist Sees eye doctor Dr. Huel Cote, [facility] - prescribed the Wellbutrin and hydroxyzine    Here for med check.  Here for f/u on obesity.   She was taking Saxenda daily but got tired of having to inject every single day.  She just recently switched to Ozempic 1 mg a few weeks ago.  So far really likes this.  She can tell it is helping with appetite and cravings and happy she only has to inject once per week.  No significant nausea or other side effect.  She is exercising regularly.  She is trying to eat healthy.  He just got back on vacation and unfortunately did not gain any weight.  Migraines -doing great on average every.  Headaches are well controlled.  She uses her Bernita Raisin for abortive headache therapy.  She gets rebound headaches with Maxalt.   Past Medical History:  Diagnosis Date   IUD (intrauterine device) in place    Mirena   Migraines    Current Outpatient Medications on File Prior to Visit  Medication Sig Dispense Refill   AJOVY 225 MG/1.5ML SOSY INJECT 1 SYRINGE EVERY 28 DAYS INTO THE SKIN 1.5 mL 5   buPROPion (WELLBUTRIN XL) 150 MG 24 hr tablet Take 150 mg by mouth 2 (two) times daily.     estradiol (ESTRACE) 0.5 MG tablet Take 0.5 mg by mouth daily.     Insulin Pen Needle (BD PEN NEEDLE NANO U/F) 32G X 4 MM MISC 1 each by Does not apply route at bedtime. 100 each 11   levonorgestrel (MIRENA) 20 MCG/24HR IUD 1 each by Intrauterine route once. June 2016     omeprazole (PRILOSEC) 40 MG capsule TAKE 1 CAPSULE BY MOUTH EVERY DAY 90 capsule 0   promethazine (PHENERGAN) 25 MG tablet Take 25 mg by mouth every 8 (eight) hours as needed.     Semaglutide, 1 MG/DOSE, (OZEMPIC, 1 MG/DOSE,) 4  MG/3ML SOPN Inject 1 mg into the skin once a week. 3 mL 0   senna (SENOKOT) 8.6 MG TABS tablet Take 1 tablet by mouth.     Ubrogepant (UBRELVY) 100 MG TABS Take at earliest onset of migraine. May repeat dose after 2 hours if needed 30 tablet 3   cyclobenzaprine (FLEXERIL) 10 MG tablet Take 1 tablet (10 mg total) by mouth 3 (three) times daily as needed for muscle spasms. (Patient not taking: Reported on 08/06/2020) 30 tablet 5   rizatriptan (MAXALT) 10 MG tablet TAKE 1 TABLET AS NEEDED FOR MIGRAINE. MAY REPEAT IN 2 HOURS IF NEEDED. MAX 2 TABS/24HRS (Patient not taking: Reported on 08/06/2020) 10 tablet 3   No current facility-administered medications on file prior to visit.   ROS as in subjective   Objective: BP 120/80   Pulse 83   Ht 5\' 3"  (1.6 m)   Wt 211 lb 3.2 oz (95.8 kg)   BMI 37.41 kg/m   BP Readings from Last 3 Encounters:  08/06/20 120/80  06/03/20 128/80  11/22/19 132/86   Wt Readings from Last 3 Encounters:  08/06/20 211 lb 3.2 oz (95.8 kg)  06/03/20 211 lb 9.6 oz (96 kg)  04/10/20 207 lb 3.2 oz (94 kg)    Gen: wd, wn, nad Heart  rrr, normal s1, s2, no murmurs Lungs clear Ext no edema    Assessment: Encounter Diagnoses  Name Primary?   Chronic migraine without aura without status migrainosus, not intractable Yes   BMI 37.0-37.9, adult    High risk medication use       Plan: Chronic migraines-controlled on current regimen.  Sees neurology  Obesity-so far doing well on Ozempic 1 mg weekly injection that she started 2 to 3 weeks ago.  She was on Saxenda daily injection prior but this gave her too much abdominal bloating and nausea plus it was a daily injection.  So far she is really happy with the Ozempic.  Continue regular exercise and healthy diet.  Continue efforts to lose weight.  We can consider increasing to 2 mg Ozempic over the next month if needed  F/u - call report 1-2 months on weight , medication tolerance

## 2020-08-10 ENCOUNTER — Other Ambulatory Visit: Payer: Self-pay | Admitting: Medical

## 2020-08-11 ENCOUNTER — Other Ambulatory Visit: Payer: Self-pay

## 2020-08-11 MED ORDER — OZEMPIC (1 MG/DOSE) 4 MG/3ML ~~LOC~~ SOPN: 1.0000 mg | PEN_INJECTOR | SUBCUTANEOUS | 0 refills | Status: DC

## 2020-08-15 MED ORDER — METRONIDAZOLE 500 MG PO TABS: 500.0000 mg | ORAL_TABLET | Freq: Three times a day (TID) | ORAL | 0 refills | Status: DC
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
## 2020-08-21 ENCOUNTER — Encounter: Payer: Self-pay | Admitting: Neurology

## 2020-08-21 ENCOUNTER — Other Ambulatory Visit: Payer: Self-pay

## 2020-08-21 ENCOUNTER — Ambulatory Visit (INDEPENDENT_AMBULATORY_CARE_PROVIDER_SITE_OTHER): Payer: 59 | Admitting: Neurology

## 2020-08-21 DIAGNOSIS — M545 Low back pain, unspecified: Secondary | ICD-10-CM

## 2020-08-21 DIAGNOSIS — M898X1 Other specified disorders of bone, shoulder: Secondary | ICD-10-CM | POA: Diagnosis not present

## 2020-08-21 MED ORDER — PROMETHAZINE HCL 25 MG PO TABS
25.0000 mg | ORAL_TABLET | Freq: Three times a day (TID) | ORAL | 5 refills | Status: DC | PRN
  Filled 2021-03-06: qty 30, 10d supply, fill #0
  Filled 2021-08-08: qty 30, 10d supply, fill #1

## 2020-08-21 MED ORDER — CYCLOBENZAPRINE HCL 10 MG PO TABS: 10.0000 mg | ORAL_TABLET | Freq: Three times a day (TID) | ORAL | 5 refills | Status: DC | PRN

## 2020-08-21 MED ORDER — UBRELVY 100 MG PO TABS: ORAL_TABLET | ORAL | 3 refills | Status: DC

## 2020-08-21 MED ORDER — RIZATRIPTAN BENZOATE 10 MG PO TABS: ORAL_TABLET | ORAL | 5 refills | Status: DC

## 2020-08-21 NOTE — Patient Instructions (Addendum)
Continue Ajovy every 28 days Refilled Ubrelvy, Maxalt, promethazine and Flexeril Instead of Ubrelvy, try Nurtec (1 tablet daily as needed). If better than Ubrelvy, contact me Limit use of pain relievers to no more than 2 days out of week to prevent risk of rebound or medication-overuse headache. Keep headache diary Follow up on year

## 2020-08-22 ENCOUNTER — Encounter: Payer: Self-pay | Admitting: Internal Medicine

## 2020-08-22 ENCOUNTER — Telehealth: Payer: Self-pay

## 2020-08-22 NOTE — Telephone Encounter (Signed)
New message    Pending  (KeyDaneil Dan) Rx #: [id number] Ubrelvy 100MG  tablets Wait for Determination  Please wait for [facility] Medicaid 2017 to return a determination.

## 2020-08-25 NOTE — Telephone Encounter (Signed)
F/u   (Key: [id number]) Rx #: [id number] Ubrelvy 100MG  tablets  Message from Plan Approved. This drug has been approved. Approved quantity: 30 <> per 30 day(s). You may fill up to a 34 day supply at a retail pharmacy. You may fill up to a 90 day supply for maintenance drugs, please refer to the formulary for details. Please call the pharmacy to process your prescription claim.

## 2020-09-04 DIAGNOSIS — Z419 Encounter for procedure for purposes other than remedying health state, unspecified: Secondary | ICD-10-CM | POA: Diagnosis not present

## 2020-09-11 ENCOUNTER — Other Ambulatory Visit: Payer: Self-pay

## 2020-09-11 ENCOUNTER — Other Ambulatory Visit: Payer: Self-pay | Admitting: Medical

## 2020-09-12 MED ORDER — OZEMPIC (1 MG/DOSE) 4 MG/3ML ~~LOC~~ SOPN: 1.0000 mg | PEN_INJECTOR | SUBCUTANEOUS | 0 refills | Status: DC

## 2020-09-17 LAB — HM PAP SMEAR: HM Pap smear: ABNORMAL

## 2020-09-17 LAB — RESULTS CONSOLE HPV: CHL HPV: NEGATIVE

## 2020-09-18 ENCOUNTER — Encounter: Payer: Self-pay | Admitting: Internal Medicine

## 2020-09-25 ENCOUNTER — Other Ambulatory Visit: Payer: Self-pay

## 2020-09-25 MED ORDER — NURTEC 75 MG PO TBDP: 75.0000 mg | ORAL_TABLET | ORAL | 3 refills | Status: DC | PRN

## 2020-09-25 NOTE — Progress Notes (Signed)
Per Pt, Nurtec Works faster then [employer] to have [employer]	[place] sent to her Pharmacy.  Advised pt She has A PA approved  for Urbelvy from her insurance. We will have to re due a PA for Nurtec . She may stop by to pick up samples Of Nurtec if she needs.   Nurtec 16 tab [facility] [address] [facility] pt  request.

## 2020-09-26 ENCOUNTER — Encounter: Payer: Self-pay | Admitting: Internal Medicine

## 2020-10-04 DIAGNOSIS — Z419 Encounter for procedure for purposes other than remedying health state, unspecified: Secondary | ICD-10-CM | POA: Diagnosis not present

## 2020-10-10 ENCOUNTER — Other Ambulatory Visit: Payer: Self-pay

## 2020-10-10 ENCOUNTER — Other Ambulatory Visit: Payer: Self-pay | Admitting: Medical

## 2020-10-10 MED ORDER — OZEMPIC (1 MG/DOSE) 4 MG/3ML ~~LOC~~ SOPN: 1.0000 mg | PEN_INJECTOR | SUBCUTANEOUS | 1 refills | Status: DC

## 2020-11-04 DIAGNOSIS — Z419 Encounter for procedure for purposes other than remedying health state, unspecified: Secondary | ICD-10-CM | POA: Diagnosis not present

## 2020-11-13 ENCOUNTER — Other Ambulatory Visit: Payer: Self-pay | Admitting: Medical

## 2020-11-13 MED ORDER — SCOPOLAMINE 1 MG/3DAYS TD PT72: 1.0000 | MEDICATED_PATCH | TRANSDERMAL | 0 refills | Status: DC

## 2020-11-14 ENCOUNTER — Ambulatory Visit (INDEPENDENT_AMBULATORY_CARE_PROVIDER_SITE_OTHER): Payer: 59 | Admitting: Medical

## 2020-11-14 ENCOUNTER — Other Ambulatory Visit: Payer: Self-pay

## 2020-11-14 VITALS — BP 110/70 | HR 70 | Wt 194.0 lb

## 2020-11-14 DIAGNOSIS — L659 Nonscarring hair loss, unspecified: Secondary | ICD-10-CM | POA: Diagnosis not present

## 2020-11-14 DIAGNOSIS — R030 Elevated blood-pressure reading, without diagnosis of hypertension: Secondary | ICD-10-CM | POA: Diagnosis not present

## 2020-11-14 DIAGNOSIS — Z79899 Other long term (current) drug therapy: Secondary | ICD-10-CM | POA: Diagnosis not present

## 2020-11-14 DIAGNOSIS — Z6834 Body mass index (BMI) 34.0-34.9, adult: Secondary | ICD-10-CM | POA: Insufficient documentation

## 2020-11-14 DIAGNOSIS — G43709 Chronic migraine without aura, not intractable, without status migrainosus: Secondary | ICD-10-CM

## 2020-11-14 MED ORDER — OZEMPIC (1 MG/DOSE) 4 MG/3ML ~~LOC~~ SOPN: 1.0000 mg | PEN_INJECTOR | SUBCUTANEOUS | 1 refills | Status: DC

## 2020-11-14 NOTE — Progress Notes (Signed)
Subjective:  Mariah Ferrell is a 40 y.o. female who presents for Chief Complaint  Patient presents with   Follow-up    Follow-up from the ozempic       Medical Team: [employer] in [facility] for psychiatry Mariah Ferrell, neurology Mariah Ferrell, Mariah Balo, PA-C here for primary care  Here for recheck on medication ozempic.  She is using help with weight loss given obesity.  Started saxenda last year, but had to change to ozempic.  Started ozempic 08/2020.  Started around 211lb.  Feels like ozempic way more than Saxenda.  It is affordable.   Exercise - recently changed jobs.  Moving a lot more at work.  Was getting 4k steps daily, now getting 9-10k steps daily.  Not currently doing weights.      Eating habits - good.  Has cut back on calories, cut down to 1 soda daily.     Sister diabetic, mother is prediabetic, so worried about preventing this.    She notes BPs have been borderline in the past, but is improved with her weight loss.   Recently has noticed hair loss, lack of hair growth.  Worried about whether medication is causing this.  Wonders about her psychiatry medication causing this.   Her sister is bald, but on medications for manic depression  No other aggravating or relieving factors.    No other c/o.  Past Medical History:  Diagnosis Date   IUD (intrauterine device) in place    Mirena   Migraines    Current Outpatient Medications on File Prior to Visit  Medication Sig Dispense Refill   AJOVY 225 MG/1.5ML SOSY INJECT 1 SYRINGE EVERY 28 DAYS INTO THE SKIN 1.5 mL 5   buPROPion (WELLBUTRIN XL) 150 MG 24 hr tablet Take 150 mg by mouth 2 (two) times daily.     clonazePAM (KLONOPIN) 1 MG tablet Take 0.5-1 mg by mouth at bedtime as needed.     cyclobenzaprine (FLEXERIL) 10 MG tablet Take 1 tablet (10 mg total) by mouth 3 (three) times daily as needed for muscle spasms. 30 tablet 5   estradiol (ESTRACE) 0.5 MG tablet Take 0.5 mg by mouth daily.     ibuprofen (ADVIL) 600 MG  tablet Take 600 mg by mouth 3 (three) times daily.     Insulin Pen Needle (BD PEN NEEDLE NANO U/F) 32G X 4 MM MISC 1 each by Does not apply route at bedtime. 100 each 11   levonorgestrel (MIRENA) 20 MCG/24HR IUD 1 each by Intrauterine route once. June 2016     omeprazole (PRILOSEC) 40 MG capsule TAKE 1 CAPSULE BY MOUTH EVERY DAY 90 capsule 0   Rimegepant Sulfate (NURTEC) 75 MG TBDP Take 75 mg by mouth as needed (take 1 tab at the earlist onset of a Migraine.  max 1 tab in 24 hours). 16 tablet 3   rizatriptan (MAXALT) 10 MG tablet TAKE 1 TABLET AS NEEDED FOR MIGRAINE. MAY REPEAT IN 2 HOURS IF NEEDED. MAX 2 TABS/24HRS 10 tablet 5   scopolamine (TRANSDERM-SCOP, 1.5 MG,) 1 MG/3DAYS Place 1 patch (1.5 mg total) onto the skin every 3 (three) days. 4 patch 0   senna (SENOKOT) 8.6 MG TABS tablet Take 1 tablet by mouth.     Ubrogepant (UBRELVY) 100 MG TABS Take at earliest onset of migraine. May repeat dose after 2 hours if needed 30 tablet 3   promethazine (PHENERGAN) 25 MG tablet Take 1 tablet (25 mg total) by mouth every 8 (eight) hours as  needed. (Patient not taking: Reported on 11/14/2020) 30 tablet 5   No current facility-administered medications on file prior to visit.   Family History  Problem Relation Age of Onset   Hypertension Mother    Diabetes Mother        prediabetes   Arthritis Mother    Benign prostatic hyperplasia Father    Depression Sister    Polycystic ovary syndrome Sister    Diabetes Sister    Pulmonary disease Maternal Grandmother    Alzheimer's disease Paternal Grandmother    Diabetes Paternal Grandfather    Healthy Daughter    Cancer Neg Hx    Heart disease Neg Hx    The following portions of the patient's history were reviewed and updated as appropriate: allergies, current medications, past family history, past medical history, past social history, past surgical history and problem list.  ROS Otherwise as in subjective above    Objective: BP 110/70   Pulse 70    Wt 194 lb (88 kg)   BMI 34.37 kg/m   Wt Readings from Last 3 Encounters:  11/14/20 194 lb (88 kg)  08/21/20 203 lb (92.1 kg)  08/06/20 211 lb 3.2 oz (95.8 kg)   General appearance: alert, no distress, well developed, well nourished neck: supple, no lymphadenopathy, no thyromegaly, no masses Heart: RRR, normal S1, S2, no murmurs Lungs: CTA bilaterally, no wheezes, rhonchi, or rales Pulses: 2+ radial pulses, 2+ pedal pulses, normal cap refill Ext: no edema Generalized thinning of hair in scalp, no distinct patches or fungus   Assessment: Encounter Diagnoses  Name Primary?   BMI 34.0-34.9,adult Yes   Elevated blood-pressure reading without diagnosis of hypertension    Hair loss    Chronic migraine without aura without status migrainosus, not intractable    High risk medication use      Plan: BMI greater than 34-glad to see she has seen good positive experience with medication.  Continue Ozempic.  Counseled on exercise strategies, continue efforts to lose weight.  Discussed goalsetting.  Blood pressure stable, she had elevated blood pressure prior to starting Ozempic and working on weight loss efforts  Hair loss-we discussed possible causes including stress, medication, autoimmune or other factors.  There is some autoimmune disease in her family.  None of her current medicines have obvious risk of hair loss.  Discussed taking biotin for now.  Consider dermatology consult.  When she comes back for physical we will do labs in general and some to screen for autoimmune  Migraines-managed by neurology  mood issues-see psychiatry  Mariah Ferrell was seen today for follow-up.  Diagnoses and all orders for this visit:  BMI 34.0-34.9,adult  Elevated blood-pressure reading without diagnosis of hypertension  Hair loss  Chronic migraine without aura without status migrainosus, not intractable  High risk medication use  Other orders -     Semaglutide, 1 MG/DOSE, (OZEMPIC, 1 MG/DOSE,)  4 MG/3ML SOPN; Inject 1 mg into the skin once a week.   Follow up: 1-2 mo for fasting physical

## 2020-12-04 DIAGNOSIS — Z419 Encounter for procedure for purposes other than remedying health state, unspecified: Secondary | ICD-10-CM | POA: Diagnosis not present

## 2020-12-06 ENCOUNTER — Telehealth: Payer: Self-pay

## 2020-12-06 NOTE — Telephone Encounter (Signed)
P.A. OZEMPIC 

## 2020-12-11 NOTE — Telephone Encounter (Signed)
P.A. denied, states pt has other coverage.  This was with [facility].  Called pharmacy and had them run thru [place] & went thru for $24.99, but it is out of stock.  They will order and call pt when it comes in.  Left msg for pt and sent mychart message

## 2020-12-12 NOTE — Telephone Encounter (Signed)
[facility] was processing the [facility]empic with [facility] and they were rejecting it so I did a prior authorization and it was denied stating she had primary insurance.  So I called [facility] and had them reprocess it with [employer] and it was covered and cost is $25 but it is out of stock.  They will order it and they said check back with them in about a week.

## 2021-01-04 DIAGNOSIS — Z419 Encounter for procedure for purposes other than remedying health state, unspecified: Secondary | ICD-10-CM | POA: Diagnosis not present

## 2021-01-05 ENCOUNTER — Encounter: Payer: Self-pay | Admitting: Neurology

## 2021-01-05 ENCOUNTER — Other Ambulatory Visit: Payer: Self-pay | Admitting: Neurology

## 2021-01-06 ENCOUNTER — Other Ambulatory Visit: Payer: Self-pay

## 2021-01-06 MED ORDER — AJOVY 225 MG/1.5ML ~~LOC~~ SOSY: PREFILLED_SYRINGE | SUBCUTANEOUS | 5 refills | Status: DC

## 2021-01-09 ENCOUNTER — Other Ambulatory Visit: Payer: Self-pay

## 2021-01-09 MED ORDER — AJOVY 225 MG/1.5ML ~~LOC~~ SOSY
225.0000 mg | PREFILLED_SYRINGE | SUBCUTANEOUS | 4 refills | Status: DC
  Filled 2021-02-02: qty 1.5, 30d supply, fill #0
  Filled 2021-03-06: qty 1.5, 30d supply, fill #1
  Filled 2021-04-01: qty 1.5, 30d supply, fill #2
  Filled 2021-04-28: qty 1.5, 30d supply, fill #3
  Filled 2021-05-27 (×2): qty 1.5, 30d supply, fill #4

## 2021-01-12 ENCOUNTER — Telehealth: Payer: Self-pay

## 2021-01-12 ENCOUNTER — Other Ambulatory Visit: Payer: Self-pay | Admitting: Obstetrics and Gynecology

## 2021-01-12 DIAGNOSIS — N6019 Diffuse cystic mastopathy of unspecified breast: Secondary | ICD-10-CM

## 2021-01-12 DIAGNOSIS — F411 Generalized anxiety disorder: Secondary | ICD-10-CM | POA: Diagnosis not present

## 2021-01-12 DIAGNOSIS — F331 Major depressive disorder, recurrent, moderate: Secondary | ICD-10-CM | POA: Diagnosis not present

## 2021-01-12 NOTE — Telephone Encounter (Signed)
[facility] PA for patients Ozempic- unable to complete via cover my meds, called [employer] and they faxed for for Medicaid drug coverage request form. Form filled out signed by Vincenza Hews and faxed to [facility]. Waiting for coverage determination response

## 2021-01-14 NOTE — Telephone Encounter (Signed)
Ozempic denied- reason being FDA does not approve to use for BMI of 34-34.9 only type 2 DM. Paper work is coming back in folder for review

## 2021-01-15 NOTE — Telephone Encounter (Signed)
Left message for pt to call me back 

## 2021-01-16 NOTE — Telephone Encounter (Signed)
Will wait for patient response, Routing for [facility]

## 2021-01-16 NOTE — Telephone Encounter (Signed)
Ive tried to call pt to see if she wanted to change that way a PA could be done for wegovy

## 2021-01-21 ENCOUNTER — Encounter: Payer: Self-pay | Admitting: Internal Medicine

## 2021-02-02 ENCOUNTER — Other Ambulatory Visit: Payer: Self-pay

## 2021-02-02 MED ORDER — UBRELVY 100 MG PO TABS
ORAL_TABLET | ORAL | 2 refills | Status: DC
  Filled 2021-02-02: qty 30, 30d supply, fill #0
  Filled 2021-03-06: qty 30, 30d supply, fill #1
  Filled 2021-04-01: qty 30, 30d supply, fill #2

## 2021-02-03 ENCOUNTER — Other Ambulatory Visit: Payer: Self-pay

## 2021-02-04 DIAGNOSIS — Z419 Encounter for procedure for purposes other than remedying health state, unspecified: Secondary | ICD-10-CM | POA: Diagnosis not present

## 2021-02-11 ENCOUNTER — Other Ambulatory Visit: Payer: Self-pay | Admitting: Medical

## 2021-02-17 DIAGNOSIS — M25511 Pain in right shoulder: Secondary | ICD-10-CM

## 2021-02-17 DIAGNOSIS — M25512 Pain in left shoulder: Secondary | ICD-10-CM

## 2021-02-17 DIAGNOSIS — M542 Cervicalgia: Secondary | ICD-10-CM

## 2021-02-23 ENCOUNTER — Encounter: Payer: Self-pay | Admitting: Medical

## 2021-02-23 ENCOUNTER — Ambulatory Visit
Admission: RE | Admit: 2021-02-23 | Discharge: 2021-02-23 | Disposition: A | Discharge status: Home / Self Care | Payer: Medicaid Other | Source: Ambulatory Visit | Attending: Obstetrics and Gynecology | Admitting: Obstetrics and Gynecology

## 2021-02-23 ENCOUNTER — Ambulatory Visit (INDEPENDENT_AMBULATORY_CARE_PROVIDER_SITE_OTHER): Payer: 59 | Admitting: Medical

## 2021-02-23 ENCOUNTER — Other Ambulatory Visit: Payer: Self-pay

## 2021-02-23 VITALS — BP 110/68 | HR 84 | Ht 63.0 in | Wt 174.8 lb

## 2021-02-23 DIAGNOSIS — Z832 Family history of diseases of the blood and blood-forming organs and certain disorders involving the immune mechanism: Secondary | ICD-10-CM

## 2021-02-23 DIAGNOSIS — N6019 Diffuse cystic mastopathy of unspecified breast: Secondary | ICD-10-CM

## 2021-02-23 DIAGNOSIS — Z6830 Body mass index (BMI) 30.0-30.9, adult: Secondary | ICD-10-CM | POA: Insufficient documentation

## 2021-02-23 DIAGNOSIS — Z7185 Encounter for immunization safety counseling: Secondary | ICD-10-CM | POA: Diagnosis not present

## 2021-02-23 DIAGNOSIS — Z0000 Encounter for general adult medical examination without abnormal findings: Secondary | ICD-10-CM

## 2021-02-23 DIAGNOSIS — Z79899 Other long term (current) drug therapy: Secondary | ICD-10-CM

## 2021-02-23 DIAGNOSIS — K5909 Other constipation: Secondary | ICD-10-CM

## 2021-02-23 DIAGNOSIS — G43709 Chronic migraine without aura, not intractable, without status migrainosus: Secondary | ICD-10-CM

## 2021-02-23 DIAGNOSIS — H6123 Impacted cerumen, bilateral: Secondary | ICD-10-CM | POA: Insufficient documentation

## 2021-02-23 DIAGNOSIS — Z131 Encounter for screening for diabetes mellitus: Secondary | ICD-10-CM

## 2021-02-23 DIAGNOSIS — Z1589 Genetic susceptibility to other disease: Secondary | ICD-10-CM

## 2021-02-23 LAB — HM MAMMOGRAPHY

## 2021-02-23 MED ORDER — TRULANCE 3 MG PO TABS: 1.0000 | ORAL_TABLET | Freq: Every day | ORAL | 0 refills | Status: DC

## 2021-02-23 NOTE — Patient Instructions (Signed)
This visit was a preventative care visit, also known as wellness visit or routine physical.   Topics typically include healthy lifestyle, diet, exercise, preventative care, vaccinations, sick and well care, proper use of emergency dept and after hours care, as well as other concerns.     Recommendations: Continue to return yearly for your annual wellness and preventative care visits.  This gives [place] a chance to discuss healthy lifestyle, exercise, vaccinations, review your chart record, and perform screenings where appropriate.  I recommend you see your eye doctor yearly for routine vision care.  I recommend you see your dentist yearly for routine dental care including hygiene visits twice yearly.   Vaccination recommendations were reviewed Immunization History  Administered Date(s) Administered   Influenza Whole 10/04/2008   Influenza,inj,Quad PF,6+ Mos 09/10/2017, 10/28/2018, 09/23/2019   Influenza-Unspecified 10/28/2018, 10/03/2020   PFIZER(Purple Top)SARS-COV-2 Vaccination 01/26/2019, 02/16/2019, 09/23/2019   Td 12/12/2008      Screening for cancer: Colon cancer screening: Consider GI consult given uncle with hx/o colon cancer and chronic constipation  Breast cancer screening: You should perform a self breast exam monthly.   We reviewed recommendations for regular mammograms and breast cancer screening.  Cervical cancer screening: We reviewed recommendations for pap smear screening.   Skin cancer screening: Check your skin regularly for new changes, growing lesions, or other lesions of concern Come in for evaluation if you have skin lesions of concern.  Lung cancer screening: If you have a greater than 20 pack year history of tobacco use, then you may qualify for lung cancer screening with a chest CT scan.   Please call your insurance company to inquire about coverage for this test.  We currently don't have screenings for other cancers besides breast, cervical, colon,  and lung cancers.  If you have a strong family history of cancer or have other cancer screening concerns, please let me know.    Bone health: Get at least 150 minutes of aerobic exercise weekly Get weight bearing exercise at least once weekly Bone density test:  A bone density test is an imaging test that uses a type of X-ray to measure the amount of calcium and other minerals in your bones. The test may be used to diagnose or screen you for a condition that causes weak or thin bones (osteoporosis), predict your risk for a broken bone (fracture), or determine how well your osteoporosis treatment is working. The bone density test is recommended for females 65 and older, or females or males <65 if certain risk factors such as thyroid disease, long term use of steroids such as for asthma or rheumatological issues, vitamin D deficiency, estrogen deficiency, family history of osteoporosis, self or family history of fragility fracture in first degree relative.    Heart health: Get at least 150 minutes of aerobic exercise weekly Limit alcohol It is important to maintain a healthy blood pressure and healthy cholesterol numbers  Heart disease screening: Screening for heart disease includes screening for blood pressure, fasting lipids, glucose/diabetes screening, BMI height to weight ratio, reviewed of smoking status, physical activity, and diet.    Goals include blood pressure 120/80 or less, maintaining a healthy lipid/cholesterol profile, preventing diabetes or keeping diabetes numbers under good control, not smoking or using tobacco products, exercising most days per week or at least 150 minutes per week of exercise, and eating healthy variety of fruits and vegetables, healthy oils, and avoiding unhealthy food choices like fried food, fast food, high sugar and high cholesterol foods.  Other tests may possibly include EKG test, CT coronary calcium score, echocardiogram, exercise treadmill stress  test.    Medical care options: I recommend you continue to seek care here first for routine care.  We try really hard to have available appointments Monday through Friday daytime hours for sick visits, acute visits, and physicals.  Urgent care should be used for after hours and weekends for significant issues that cannot wait till the next day.  The emergency department should be used for significant potentially life-threatening emergencies.  The emergency department is expensive, can often have long wait times for less significant concerns, so try to utilize primary care, urgent care, or telemedicine when possible to avoid unnecessary trips to the emergency department.  Virtual visits and telemedicine have been introduced since the pandemic started in 2020, and can be convenient ways to receive medical care.  We offer virtual appointments as well to assist you in a variety of options to seek medical care.   Separate significant issues discussed: BMI 30 - continue efforts with weight loss, exercise, ozempic to help get to goal  Chronic constipation - trial of Trulance samples.   Consider seeing GI.  Impacted cerumen - Discussed findings.  Discussed risk/benefits of procedure and patient agrees to procedure. Successfully used warm water lavage to remove impacted cerumen from bilat ear canal. Patient tolerated procedure well. Advised they avoid using any cotton swabs or other devices to clean the ear canals.  Use basic hygiene as discussed.  Follow up prn.

## 2021-02-23 NOTE — Progress Notes (Signed)
Subjective:   HPI  Mariah Ferrell is a 41 y.o. female who presents for Chief Complaint  Patient presents with   fasting cpe    Fasting cpe, sees obgyn- [facility] obgyn. Has mammogram today    Patient Care Team: Rambo Sarafian, Camelia Eng, PA-C as PCP - General (Family Medicine) Pieter Partridge, DO as Consulting Physician (Neurology) Sees dentist Sees eye doctor Dr. Tona Sensing, [facility]   Concerns: Only concern is chronic constipation.   Uses senna daily otherwise only BM once week  Has mammogram today  Still doing well with weight loss efforts on current medication   Reviewed their medical, surgical, family, social, medication, and allergy history and updated chart as appropriate.  Past Medical History:  Diagnosis Date   Constipation    chronic   GERD (gastroesophageal reflux disease)    HLA B27 positive    IUD (intrauterine device) in place    Mirena   Migraines     Family History  Problem Relation Age of Onset   Hypertension Mother    Diabetes Mother        prediabetes   Arthritis Mother    Benign prostatic hyperplasia Father    Depression Sister    Polycystic ovary syndrome Sister    Diabetes Sister    Healthy Daughter    Cancer Maternal Uncle        colon   Pulmonary disease Maternal Grandmother    Alzheimer's disease Paternal Grandmother    Diabetes Paternal Grandfather    Cancer Cousin        breast   Heart disease Neg Hx      Current Outpatient Medications:    buPROPion (WELLBUTRIN XL) 150 MG 24 hr tablet, Take 150 mg by mouth 2 (two) times daily., Disp: , Rfl:    clonazePAM (KLONOPIN) 1 MG tablet, Take 0.5-1 mg by mouth at bedtime as needed., Disp: , Rfl:    cyclobenzaprine (FLEXERIL) 10 MG tablet, Take 1 tablet (10 mg total) by mouth 3 (three) times daily as needed for muscle spasms., Disp: 30 tablet, Rfl: 5   estradiol (ESTRACE) 0.5 MG tablet, Take 0.5 mg by mouth daily., Disp: , Rfl:    Fremanezumab-vfrm (AJOVY) 225 MG/1.5ML SOSY,  Inject 1 syringe (225 mg) into the skin every 28 days., Disp: 1.5 mL, Rfl: 4   ibuprofen (ADVIL) 600 MG tablet, Take 600 mg by mouth 3 (three) times daily., Disp: , Rfl:    levonorgestrel (MIRENA) 20 MCG/24HR IUD, 1 each by Intrauterine route once. June 2016, Disp: , Rfl:    omeprazole (PRILOSEC) 40 MG capsule, TAKE 1 CAPSULE BY MOUTH EVERY DAY, Disp: 90 capsule, Rfl: 0   OZEMPIC, 1 MG/DOSE, 4 MG/3ML SOPN, INJECT 1MG INTO THE SKIN ONCE A WEEK, Disp: 3 mL, Rfl: 1   promethazine (PHENERGAN) 25 MG tablet, Take 1 tablet (25 mg total) by mouth every 8 (eight) hours as needed., Disp: 30 tablet, Rfl: 5   Rimegepant Sulfate (NURTEC) 75 MG TBDP, Take 75 mg by mouth as needed (take 1 tab at the earlist onset of a Migraine.  max 1 tab in 24 hours)., Disp: 16 tablet, Rfl: 3   rizatriptan (MAXALT) 10 MG tablet, TAKE 1 TABLET AS NEEDED FOR MIGRAINE. MAY REPEAT IN 2 HOURS IF NEEDED. MAX 2 TABS/24HRS, Disp: 10 tablet, Rfl: 5   senna (SENOKOT) 8.6 MG TABS tablet, Take 1 tablet by mouth., Disp: , Rfl:    Ubrogepant (UBRELVY) 100 MG TABS, Take at earliest onset of migraine.  May repeat dose after 2 hours if needed, Disp: 30 tablet, Rfl: 2   Insulin Pen Needle (BD PEN NEEDLE NANO U/F) 32G X 4 MM MISC, 1 each by Does not apply route at bedtime., Disp: 100 each, Rfl: 11  Allergies  Allergen Reactions   Aleve [Naproxen] Hives   Codeine     REACTION: nausea Can take codeine with phenergan     Review of Systems Constitutional: -fever, -chills, -sweats, -unexpected weight change, -decreased appetite, -fatigue Allergy: -sneezing, -itching, -congestion Dermatology: -changing moles, --rash, -lumps ENT: -runny nose, -ear pain, -sore throat, -hoarseness, -sinus pain, -teeth pain, - ringing in ears, -hearing loss, -nosebleeds Cardiology: -chest pain, -palpitations, -swelling, -difficulty breathing when lying flat, -waking up short of breath Respiratory: -cough, -shortness of breath, -difficulty breathing with exercise or  exertion, -wheezing, -coughing up blood Gastroenterology: -abdominal pain, -nausea, -vomiting, -diarrhea, +constipation, -blood in stool, -changes in bowel movement, -difficulty swallowing or eating Hematology: -bleeding, -bruising  Musculoskeletal: -joint aches, -muscle aches, -joint swelling, -back pain, -neck pain, -cramping, -changes in gait Ophthalmology: denies vision changes, eye redness, itching, discharge Urology: -burning with urination, -difficulty urinating, -blood in urine, -urinary frequency, -urgency, -incontinence Neurology: -headache, -weakness, -tingling, -numbness, -memory loss, -falls, -dizziness Psychology: -depressed mood, -agitation, -sleep problems Breast/gyn: -breast tendnerss, -discharge, -lumps, -vaginal discharge,- irregular periods, -heavy periods   Depression screen [facility] 2/9 02/23/2021 11/14/2020 08/06/2020 10/31/2017 10/24/2017  Decreased Interest 0 0 0 0 0  Down, Depressed, Hopeless 0 0 0 0 0  PHQ - 2 Score 0 0 0 0 0  Altered sleeping - 0 0 - -  Tired, decreased energy - 0 1 - -  Change in appetite - 0 - - -  Feeling bad or failure about yourself  - 0 0 - -  Trouble concentrating - 0 0 - -  Moving slowly or fidgety/restless - 0 0 - -  Suicidal thoughts - 0 0 - -  PHQ-9 Score - 0 1 - -  Difficult doing work/chores - Not difficult at all Not difficult at all - -       Objective:  BP 110/68    Pulse 84    Ht _0  (1.6 m)    Wt 174 lb 12.8 oz (79.3 kg)    BMI 30.96 kg/m   General appearance: alert, no distress, WD/WN, African American female Skin: scattered macules, no worrisome lesions HEENT: normocephalic, conjunctiva/corneas normal, sclerae anicteric, PERRLA, EOMi, nares patent, no discharge or erythema, pharynx normal, ears with impacted cerumen bilat Oral cavity: MMM, tongue normal, teeth normal Neck: supple, no lymphadenopathy, no thyromegaly, no masses, normal ROM, no bruits Chest: non tender, normal shape and expansion Heart: RRR, normal S1, S2,  no murmurs Lungs: CTA bilaterally, no wheezes, rhonchi, or rales Abdomen: +bs, soft, non tender, non distended, no masses, no hepatomegaly, no splenomegaly, no bruits Back: non tender, normal ROM, no scoliosis Musculoskeletal: upper extremities non tender, no obvious deformity, normal ROM throughout, lower extremities non tender, no obvious deformity, normal ROM throughout Extremities: no edema, no cyanosis, no clubbing Pulses: 2+ symmetric, upper and lower extremities, normal cap refill Neurological: alert, oriented x 3, CN2-12 intact, strength normal upper extremities and lower extremities, sensation normal throughout, DTRs 2+ throughout, no cerebellar signs, gait normal Psychiatric: normal affect, behavior normal, pleasant  Breast/gyn/rectal - deferred to gynecology    Assessment and Plan :   Encounter Diagnoses  Name Primary?   Encounter for health maintenance examination in adult Yes   Family history of autoimmune disorder  High risk medication use    Vaccine counseling    Chronic constipation    BMI 30.0-30.9,adult    HLA B27 (HLA B27 positive)    Chronic migraine without aura without status migrainosus, not intractable    Screening for diabetes mellitus    Impacted cerumen of both ears      This visit was a preventative care visit, also known as wellness visit or routine physical.   Topics typically include healthy lifestyle, diet, exercise, preventative care, vaccinations, sick and well care, proper use of emergency dept and after hours care, as well as other concerns.     Recommendations: Continue to return yearly for your annual wellness and preventative care visits.  This gives [place] a chance to discuss healthy lifestyle, exercise, vaccinations, review your chart record, and perform screenings where appropriate.  I recommend you see your eye doctor yearly for routine vision care.  I recommend you see your dentist yearly for routine dental care including hygiene visits  twice yearly.   Vaccination recommendations were reviewed Immunization History  Administered Date(s) Administered   Influenza Whole 10/04/2008   Influenza,inj,Quad PF,6+ Mos 09/10/2017, 10/28/2018, 09/23/2019   Influenza-Unspecified 10/28/2018, 10/03/2020   PFIZER(Purple Top)SARS-COV-2 Vaccination 01/26/2019, 02/16/2019, 09/23/2019   Td 12/12/2008      Screening for cancer: Colon cancer screening: Consider GI consult given uncle with hx/o colon cancer and chronic constipation  Breast cancer screening: You should perform a self breast exam monthly.   We reviewed recommendations for regular mammograms and breast cancer screening.  Cervical cancer screening: We reviewed recommendations for pap smear screening.   Skin cancer screening: Check your skin regularly for new changes, growing lesions, or other lesions of concern Come in for evaluation if you have skin lesions of concern.  Lung cancer screening: If you have a greater than 20 pack year history of tobacco use, then you may qualify for lung cancer screening with a chest CT scan.   Please call your insurance company to inquire about coverage for this test.  We currently don't have screenings for other cancers besides breast, cervical, colon, and lung cancers.  If you have a strong family history of cancer or have other cancer screening concerns, please let me know.    Bone health: Get at least 150 minutes of aerobic exercise weekly Get weight bearing exercise at least once weekly Bone density test:  A bone density test is an imaging test that uses a type of X-ray to measure the amount of calcium and other minerals in your bones. The test may be used to diagnose or screen you for a condition that causes weak or thin bones (osteoporosis), predict your risk for a broken bone (fracture), or determine how well your osteoporosis treatment is working. The bone density test is recommended for females 92 and older, or females or  males <01 if certain risk factors such as thyroid disease, long term use of steroids such as for asthma or rheumatological issues, vitamin D deficiency, estrogen deficiency, family history of osteoporosis, self or family history of fragility fracture in first degree relative.    Heart health: Get at least 150 minutes of aerobic exercise weekly Limit alcohol It is important to maintain a healthy blood pressure and healthy cholesterol numbers  Heart disease screening: Screening for heart disease includes screening for blood pressure, fasting lipids, glucose/diabetes screening, BMI height to weight ratio, reviewed of smoking status, physical activity, and diet.    Goals include blood pressure 120/80 or less, maintaining a  healthy lipid/cholesterol profile, preventing diabetes or keeping diabetes numbers under good control, not smoking or using tobacco products, exercising most days per week or at least 150 minutes per week of exercise, and eating healthy variety of fruits and vegetables, healthy oils, and avoiding unhealthy food choices like fried food, fast food, high sugar and high cholesterol foods.    Other tests may possibly include EKG test, CT coronary calcium score, echocardiogram, exercise treadmill stress test.    Medical care options: I recommend you continue to seek care here first for routine care.  We try really hard to have available appointments Monday through Friday daytime hours for sick visits, acute visits, and physicals.  Urgent care should be used for after hours and weekends for significant issues that cannot wait till the next day.  The emergency department should be used for significant potentially life-threatening emergencies.  The emergency department is expensive, can often have long wait times for less significant concerns, so try to utilize primary care, urgent care, or telemedicine when possible to avoid unnecessary trips to the emergency department.  Virtual visits and  telemedicine have been introduced since the pandemic started in 2020, and can be convenient ways to receive medical care.  We offer virtual appointments as well to assist you in a variety of options to seek medical care.   Separate significant issues discussed: BMI 30 - continue efforts with weight loss, exercise, ozempic to help get to goal  Chronic constipation - trial of Trulance samples.   Consider seeing GI.  Impacted cerumen - Discussed findings.  Discussed risk/benefits of procedure and patient agrees to procedure. Successfully used warm water lavage to remove impacted cerumen from bilat ear canal. Patient tolerated procedure well. Advised they avoid using any cotton swabs or other devices to clean the ear canals.  Use basic hygiene as discussed.  Follow up prn.   Zephyra was seen today for fasting cpe.  Diagnoses and all orders for this visit:  Encounter for health maintenance examination in adult -     Comprehensive metabolic panel -     CBC -     Hemoglobin A1c -     Sedimentation rate -     High sensitivity CRP  Family history of autoimmune disorder -     Sedimentation rate -     High sensitivity CRP  High risk medication use -     Sedimentation rate -     High sensitivity CRP  Vaccine counseling  Chronic constipation  BMI 30.0-30.9,adult  HLA B27 (HLA B27 positive) -     Sedimentation rate -     High sensitivity CRP  Chronic migraine without aura without status migrainosus, not intractable  Screening for diabetes mellitus -     Hemoglobin A1c  Impacted cerumen of both ears   Follow-up pending labs, yearly for physical

## 2021-02-24 ENCOUNTER — Other Ambulatory Visit: Payer: Self-pay | Admitting: Medical

## 2021-02-24 LAB — COMPREHENSIVE METABOLIC PANEL
ALT: 17 IU/L (ref 0–32)
AST: 26 IU/L (ref 0–40)
Albumin/Globulin Ratio: 2 (ref 1.2–2.2)
Albumin: 4.3 g/dL (ref 3.8–4.8)
Alkaline Phosphatase: 77 IU/L (ref 44–121)
BUN/Creatinine Ratio: 7 — ABNORMAL LOW (ref 9–23)
BUN: 7 mg/dL (ref 6–24)
Bilirubin Total: 0.5 mg/dL (ref 0.0–1.2)
CO2: 26 mmol/L (ref 20–29)
Calcium: 9.3 mg/dL (ref 8.7–10.2)
Chloride: 107 mmol/L — ABNORMAL HIGH (ref 96–106)
Creatinine, Ser: 1.06 mg/dL — ABNORMAL HIGH (ref 0.57–1.00)
Globulin, Total: 2.2 g/dL (ref 1.5–4.5)
Glucose: 76 mg/dL (ref 70–99)
Potassium: 4.7 mmol/L (ref 3.5–5.2)
Sodium: 144 mmol/L (ref 134–144)
Total Protein: 6.5 g/dL (ref 6.0–8.5)
eGFR: 68 mL/min/{1.73_m2} (ref >59)

## 2021-02-24 LAB — CBC
Hematocrit: 40 % (ref 34.0–46.6)
Hemoglobin: 12.9 g/dL (ref 11.1–15.9)
MCH: 26.7 pg (ref 26.6–33.0)
MCHC: 32.3 g/dL (ref 31.5–35.7)
MCV: 83 fL (ref 79–97)
Platelets: 298 10*3/uL (ref 150–450)
RBC: 4.83 x10E6/uL (ref 3.77–5.28)
RDW: 12.9 % (ref 11.7–15.4)
WBC: 4.4 10*3/uL (ref 3.4–10.8)

## 2021-02-24 LAB — SEDIMENTATION RATE: Sed Rate: 9 mm/hr (ref 0–32)

## 2021-02-24 LAB — HIGH SENSITIVITY CRP: CRP, High Sensitivity: 0.66 mg/L (ref 0.00–3.00)

## 2021-02-24 LAB — HEMOGLOBIN A1C
Est. average glucose Bld gHb Est-mCnc: 100 mg/dL
Hgb A1c MFr Bld: 5.1 % (ref 4.8–5.6)

## 2021-02-25 ENCOUNTER — Encounter: Payer: Self-pay | Admitting: Internal Medicine

## 2021-03-04 DIAGNOSIS — Z419 Encounter for procedure for purposes other than remedying health state, unspecified: Secondary | ICD-10-CM | POA: Diagnosis not present

## 2021-03-06 ENCOUNTER — Other Ambulatory Visit: Payer: Self-pay

## 2021-03-06 ENCOUNTER — Other Ambulatory Visit: Payer: Self-pay | Admitting: Medical

## 2021-03-06 MED ORDER — CLONAZEPAM 1 MG PO TABS
ORAL_TABLET | ORAL | 2 refills | Status: AC
  Filled 2021-03-06: qty 30, 30d supply, fill #0
  Filled 2021-04-03: qty 30, 30d supply, fill #1

## 2021-03-06 MED ORDER — ESTRADIOL 0.5 MG PO TABS
ORAL_TABLET | ORAL | 4 refills | Status: DC
  Filled 2021-03-06: qty 30, 30d supply, fill #0
  Filled 2021-04-01: qty 30, 30d supply, fill #1
  Filled 2021-04-28: qty 30, 30d supply, fill #2
  Filled 2021-06-09: qty 30, 30d supply, fill #3
  Filled 2021-07-06: qty 30, 30d supply, fill #4
  Filled 2021-08-08: qty 30, 30d supply, fill #5
  Filled 2021-09-08: qty 30, 30d supply, fill #6

## 2021-03-06 MED ORDER — TRULANCE 3 MG PO TABS
1.0000 | ORAL_TABLET | Freq: Every day | ORAL | 2 refills | Status: DC
  Filled 2021-03-06: qty 30, 30d supply, fill #0

## 2021-03-06 MED ORDER — TRULANCE 3 MG PO TABS: 1.0000 | ORAL_TABLET | Freq: Every day | ORAL | 0 refills | Status: DC

## 2021-03-06 MED ORDER — BUPROPION HCL ER (XL) 150 MG PO TB24
ORAL_TABLET | ORAL | 0 refills | Status: DC
Start: 2021-02-08 — End: 2021-06-09
  Filled 2021-03-06: qty 60, 30d supply, fill #0
  Filled 2021-04-01: qty 60, 30d supply, fill #1

## 2021-03-06 MED FILL — Semaglutide Soln Pen-inj 1 MG/DOSE (4 MG/3ML): SUBCUTANEOUS | 28 days supply | Qty: 3 | Fill #0 | Status: AC

## 2021-03-17 ENCOUNTER — Other Ambulatory Visit: Payer: Self-pay | Admitting: Family Medicine

## 2021-03-17 NOTE — Telephone Encounter (Signed)
Called pt to confirm if she really needs this fill and she does. Please advise KH ?

## 2021-04-01 ENCOUNTER — Other Ambulatory Visit: Payer: Self-pay

## 2021-04-03 ENCOUNTER — Other Ambulatory Visit: Payer: Self-pay

## 2021-04-04 DIAGNOSIS — Z419 Encounter for procedure for purposes other than remedying health state, unspecified: Secondary | ICD-10-CM | POA: Diagnosis not present

## 2021-04-05 ENCOUNTER — Other Ambulatory Visit: Payer: Self-pay | Admitting: Medical

## 2021-04-06 ENCOUNTER — Other Ambulatory Visit: Payer: Self-pay

## 2021-04-06 MED ORDER — OZEMPIC (1 MG/DOSE) 4 MG/3ML ~~LOC~~ SOPN
PEN_INJECTOR | SUBCUTANEOUS | 1 refills | Status: DC
  Filled 2021-04-06: qty 3, 28d supply, fill #0

## 2021-04-10 ENCOUNTER — Telehealth: Payer: Self-pay

## 2021-04-10 NOTE — Telephone Encounter (Signed)
Recv'd P.A. Elon Jester called pharmacy and they were running with Medicaid ins not the [employer] and it is covered by [employer].  Called pt and left message

## 2021-04-16 DIAGNOSIS — F331 Major depressive disorder, recurrent, moderate: Secondary | ICD-10-CM | POA: Diagnosis not present

## 2021-04-16 DIAGNOSIS — F411 Generalized anxiety disorder: Secondary | ICD-10-CM | POA: Diagnosis not present

## 2021-04-28 ENCOUNTER — Other Ambulatory Visit: Payer: Self-pay

## 2021-04-30 ENCOUNTER — Other Ambulatory Visit: Payer: Self-pay

## 2021-05-04 DIAGNOSIS — Z419 Encounter for procedure for purposes other than remedying health state, unspecified: Secondary | ICD-10-CM | POA: Diagnosis not present

## 2021-05-06 ENCOUNTER — Other Ambulatory Visit: Payer: Self-pay | Admitting: Medical

## 2021-05-06 MED ORDER — TRULANCE 3 MG PO TABS: 1.0000 | ORAL_TABLET | Freq: Every day | ORAL | 0 refills | Status: DC

## 2021-05-08 ENCOUNTER — Other Ambulatory Visit: Payer: Self-pay | Admitting: Medical

## 2021-05-08 MED ORDER — OZEMPIC (2 MG/DOSE) 8 MG/3ML ~~LOC~~ SOPN: 2.0000 mg | PEN_INJECTOR | SUBCUTANEOUS | 0 refills | Status: DC

## 2021-05-11 ENCOUNTER — Other Ambulatory Visit: Payer: Self-pay

## 2021-05-12 ENCOUNTER — Other Ambulatory Visit: Payer: Self-pay

## 2021-05-15 ENCOUNTER — Other Ambulatory Visit: Payer: Self-pay

## 2021-05-18 ENCOUNTER — Other Ambulatory Visit: Payer: Self-pay

## 2021-05-19 ENCOUNTER — Other Ambulatory Visit: Payer: Self-pay

## 2021-05-27 ENCOUNTER — Other Ambulatory Visit: Payer: Self-pay

## 2021-06-02 ENCOUNTER — Ambulatory Visit (INDEPENDENT_AMBULATORY_CARE_PROVIDER_SITE_OTHER): Payer: 59 | Admitting: Physician Assistant

## 2021-06-02 ENCOUNTER — Encounter: Payer: Self-pay | Admitting: Physician Assistant

## 2021-06-02 ENCOUNTER — Ambulatory Visit
Admission: RE | Admit: 2021-06-02 | Discharge: 2021-06-02 | Disposition: A | Discharge status: Home / Self Care | Payer: PRIVATE HEALTH INSURANCE | Source: Ambulatory Visit | Attending: Physician Assistant | Admitting: Physician Assistant

## 2021-06-02 VITALS — BP 110/70 | HR 71 | Ht 63.0 in | Wt 163.2 lb

## 2021-06-02 DIAGNOSIS — M545 Low back pain, unspecified: Secondary | ICD-10-CM

## 2021-06-02 DIAGNOSIS — R300 Dysuria: Secondary | ICD-10-CM | POA: Diagnosis not present

## 2021-06-02 DIAGNOSIS — R5381 Other malaise: Secondary | ICD-10-CM | POA: Diagnosis not present

## 2021-06-02 DIAGNOSIS — E559 Vitamin D deficiency, unspecified: Secondary | ICD-10-CM

## 2021-06-02 DIAGNOSIS — M62830 Muscle spasm of back: Secondary | ICD-10-CM | POA: Diagnosis not present

## 2021-06-02 DIAGNOSIS — R5383 Other fatigue: Secondary | ICD-10-CM

## 2021-06-02 LAB — POCT URINALYSIS DIP (CLINITEK)
Bilirubin, UA: NEGATIVE
Blood, UA: NEGATIVE
Glucose, UA: NEGATIVE mg/dL
Ketones, POC UA: NEGATIVE mg/dL
Leukocytes, UA: NEGATIVE
Nitrite, UA: NEGATIVE
POC PROTEIN,UA: NEGATIVE
Spec Grav, UA: <=1.005 — AB (ref 1.010–1.025)
Urobilinogen, UA: 0.2 E.U./dL
pH, UA: 6 (ref 5.0–8.0)

## 2021-06-02 MED ORDER — CYCLOBENZAPRINE HCL 10 MG PO TABS: 10.0000 mg | ORAL_TABLET | Freq: Every evening | ORAL | 0 refills | Status: DC | PRN

## 2021-06-02 NOTE — Progress Notes (Unsigned)
Acute Office Visit  Subjective:    Patient ID: Mariah Ferrell, female    DOB: 1980/05/24, 41 y.o.   MRN: [id number]  Chief Complaint  Patient presents with   Acute Visit    Left lower back pain that started last week. She is also very tired.    HPI Patient is in today for left low back pain x 3 days after riding in car to [place]; denies fall or injury; denies numbness down legs; worse with certain movements; better with rest; same thing happened 1 month ago and took OTC AZO helped; ibuprofen 4 didn't help; has flexeril didn't help; fatigue x 2 - 3 days; denies burning with urination;  Denies fever/chills, nausea/vomiting, diarrhea/+constipation relieved by Senna S; denies back surgery;    Outpatient Medications Prior to Visit  Medication Sig Dispense Refill   buPROPion (WELLBUTRIN XL) 150 MG 24 hr tablet Take 150 mg by mouth 2 (two) times daily.     buPROPion (WELLBUTRIN XL) 150 MG 24 hr tablet Take 1 tablet by mouth twice a daily for depression. 120 tablet 0   clonazePAM (KLONOPIN) 1 MG tablet Take 0.5-1 mg by mouth at bedtime as needed.     clonazePAM (KLONOPIN) 1 MG tablet Take 1/2 to 1 tablet by mouth at bedtime as needed for sleep 30 tablet 2   cyclobenzaprine (FLEXERIL) 10 MG tablet Take 1 tablet (10 mg total) by mouth 3 (three) times daily as needed for muscle spasms. 30 tablet 5   estradiol (ESTRACE) 0.5 MG tablet Take 0.5 mg by mouth daily.     estradiol (ESTRACE) 0.5 MG tablet Take 1 tablet by mouth every day 90 tablet 4   Fremanezumab-vfrm (AJOVY) 225 MG/1.5ML SOSY Inject 1 syringe (225 mg) into the skin every 28 days. 1.5 mL 4   ibuprofen (ADVIL) 600 MG tablet Take 600 mg by mouth 3 (three) times daily.     Insulin Pen Needle (BD PEN NEEDLE NANO U/F) 32G X 4 MM MISC 1 each by Does not apply route at bedtime. 100 each 11   levonorgestrel (MIRENA) 20 MCG/24HR IUD 1 each by Intrauterine route once. June 2016     omeprazole (PRILOSEC) 40 MG capsule TAKE 1 CAPSULE BY  MOUTH EVERY DAY 90 capsule 0   Plecanatide (TRULANCE) 3 MG TABS Take 1 tablet by mouth daily. 90 tablet 0   promethazine (PHENERGAN) 25 MG tablet Take 1 tablet (25 mg total) by mouth every 8 (eight) hours as needed. 30 tablet 5   Rimegepant Sulfate (NURTEC) 75 MG TBDP Take 75 mg by mouth as needed (take 1 tab at the earlist onset of a Migraine.  max 1 tab in 24 hours). 16 tablet 3   rizatriptan (MAXALT) 10 MG tablet TAKE 1 TABLET AS NEEDED FOR MIGRAINE. MAY REPEAT IN 2 HOURS IF NEEDED. MAX 2 TABS/24HRS 10 tablet 5   Semaglutide, 2 MG/DOSE, (OZEMPIC, 2 MG/DOSE,) 8 MG/3ML SOPN Inject 2 mg into the skin once a week. 3 mL 0   senna (SENOKOT) 8.6 MG TABS tablet Take 1 tablet by mouth.     Ubrogepant (UBRELVY) 100 MG TABS Take at earliest onset of migraine. May repeat dose after 2 hours if needed 30 tablet 2   metroNIDAZOLE (FLAGYL) 500 MG tablet TAKE 1 TABLET BY MOUTH 3 TIMES DAILY FOR 7 DAYS. (Patient not taking: Reported on 06/02/2021) 21 tablet 0   No facility-administered medications prior to visit.    Allergies  Allergen Reactions   Aleve [Naproxen] Hives  Codeine     REACTION: nausea Can take codeine with phenergan    Review of Systems  Constitutional:  Negative for activity change and chills.  HENT:  Negative for congestion and voice change.   Eyes:  Negative for pain and redness.  Respiratory:  Negative for cough and wheezing.   Cardiovascular:  Negative for chest pain.  Gastrointestinal:  Negative for constipation, diarrhea, nausea and vomiting.  Endocrine: Negative for polyuria.  Genitourinary:  Negative for flank pain and frequency.  Musculoskeletal:  Positive for arthralgias and back pain.  Skin:  Negative for color change and rash.  Allergic/Immunologic: Negative for immunocompromised state.  Neurological:  Negative for dizziness.  Psychiatric/Behavioral:  Negative for agitation.       Objective:    Physical Exam Vitals and nursing note reviewed.  Constitutional:       General: She is not in acute distress.    Appearance: Normal appearance. She is not ill-appearing.  HENT:     Head: Normocephalic and atraumatic.     Right Ear: External ear normal.     Left Ear: External ear normal.     Nose: No congestion.  Eyes:     Extraocular Movements: Extraocular movements intact.     Conjunctiva/sclera: Conjunctivae normal.     Pupils: Pupils are equal, round, and reactive to light.  Cardiovascular:     Rate and Rhythm: Normal rate and regular rhythm.     Pulses: Normal pulses.     Heart sounds: Normal heart sounds.  Pulmonary:     Effort: Pulmonary effort is normal.     Breath sounds: Normal breath sounds. No wheezing.  Abdominal:     General: Bowel sounds are normal.     Palpations: Abdomen is soft.  Musculoskeletal:        General: Normal range of motion.     Cervical back: Normal range of motion and neck supple.     Right lower leg: No edema.     Left lower leg: No edema.  Skin:    General: Skin is warm and dry.     Findings: No bruising.  Neurological:     General: No focal deficit present.     Mental Status: She is alert and oriented to person, place, and time.  Psychiatric:        Mood and Affect: Mood normal.        Behavior: Behavior normal.        Thought Content: Thought content normal.    BP 110/70   Pulse 71   Wt 163 lb 3.2 oz (74 kg)   SpO2 99%   BMI 28.91 kg/m   Wt Readings from Last 3 Encounters:  06/02/21 163 lb 3.2 oz (74 kg)  02/23/21 174 lb 12.8 oz (79.3 kg)  11/14/20 194 lb (88 kg)    Results for orders placed or performed in visit on 02/23/21  Comprehensive metabolic panel  Result Value Ref Range   Glucose 76 70 - 99 mg/dL   BUN 7 6 - 24 mg/dL   Creatinine, Ser 1.06 (H) 0.57 - 1.00 mg/dL   eGFR 68 >59 mL/min/1.73   BUN/Creatinine Ratio 7 (L) 9 - 23   Sodium 144 134 - 144 mmol/L   Potassium 4.7 3.5 - 5.2 mmol/L   Chloride 107 (H) 96 - 106 mmol/L   CO2 26 20 - 29 mmol/L   Calcium 9.3 8.7 - 10.2 mg/dL   Total  Protein 6.5 6.0 - 8.5 g/dL   Albumin  4.3 3.8 - 4.8 g/dL   Globulin, Total 2.2 1.5 - 4.5 g/dL   Albumin/Globulin Ratio 2.0 1.2 - 2.2   Bilirubin Total 0.5 0.0 - 1.2 mg/dL   Alkaline Phosphatase 77 44 - 121 IU/L   AST 26 0 - 40 IU/L   ALT 17 0 - 32 IU/L  CBC  Result Value Ref Range   WBC 4.4 3.4 - 10.8 x10E3/uL   RBC 4.83 3.77 - 5.28 x10E6/uL   Hemoglobin 12.9 11.1 - 15.9 g/dL   Hematocrit 40.0 34.0 - 46.6 %   MCV 83 79 - 97 fL   MCH 26.7 26.6 - 33.0 pg   MCHC 32.3 31.5 - 35.7 g/dL   RDW 12.9 11.7 - 15.4 %   Platelets 298 150 - 450 x10E3/uL  Hemoglobin A1c  Result Value Ref Range   Hgb A1c MFr Bld 5.1 4.8 - 5.6 %   Est. average glucose Bld gHb Est-mCnc 100 mg/dL  Sedimentation rate  Result Value Ref Range   Sed Rate 9 0 - 32 mm/hr  High sensitivity CRP  Result Value Ref Range   CRP, High Sensitivity 0.66 0.00 - 3.00 mg/L       Assessment & Plan:  There are no diagnoses linked to this encounter.   No orders of the defined types were placed in this encounter.   No follow-ups on file.  Irene Pap, PA-C

## 2021-06-02 NOTE — Patient Instructions (Addendum)
You can walk in for x-ray at ---  Diagnostic Radiology and Imaging  [facility] [address] [place], [place] [address]  Phone [phone] Fax [phone]  Hours of Operation General hours of operation are Monday - Friday, 8 am-5 pm   You can take OTC pain medicine as needed:  Tylenol (generic is acetamenophen)  Advil or Motrin (generic is ibuprofen) ALWAYS TAKE WITH FOOD Aleve (generic is naprosyn sodium) ALWAYS TAKE WITH FOOD  Aspercreme with lidocaine Muscle rubs like Biofreeze, IcyHot, Bengay, pain patches like SalonPas  Voltaren gel (generic is Diclofenac sodium)

## 2021-06-03 LAB — COMPREHENSIVE METABOLIC PANEL
ALT: 21 IU/L (ref 0–32)
AST: 26 IU/L (ref 0–40)
Albumin/Globulin Ratio: 1.9 (ref 1.2–2.2)
Albumin: 4.3 g/dL (ref 3.8–4.8)
Alkaline Phosphatase: 88 IU/L (ref 44–121)
BUN/Creatinine Ratio: 10 (ref 9–23)
BUN: 11 mg/dL (ref 6–24)
Bilirubin Total: 0.3 mg/dL (ref 0.0–1.2)
CO2: 25 mmol/L (ref 20–29)
Calcium: 9.5 mg/dL (ref 8.7–10.2)
Chloride: 99 mmol/L (ref 96–106)
Creatinine, Ser: 1.14 mg/dL — ABNORMAL HIGH (ref 0.57–1.00)
Globulin, Total: 2.3 g/dL (ref 1.5–4.5)
Glucose: 84 mg/dL (ref 70–99)
Potassium: 4 mmol/L (ref 3.5–5.2)
Sodium: 138 mmol/L (ref 134–144)
Total Protein: 6.6 g/dL (ref 6.0–8.5)
eGFR: 62 mL/min/{1.73_m2} (ref >59)

## 2021-06-03 LAB — CBC WITH DIFFERENTIAL/PLATELET
Basophils Absolute: 0 10*3/uL (ref 0.0–0.2)
Basos: 1 %
EOS (ABSOLUTE): 0.2 10*3/uL (ref 0.0–0.4)
Eos: 3 %
Hematocrit: 40.1 % (ref 34.0–46.6)
Hemoglobin: 13.4 g/dL (ref 11.1–15.9)
Immature Grans (Abs): 0 10*3/uL (ref 0.0–0.1)
Immature Granulocytes: 0 %
Lymphocytes Absolute: 2.3 10*3/uL (ref 0.7–3.1)
Lymphs: 39 %
MCH: 27.5 pg (ref 26.6–33.0)
MCHC: 33.4 g/dL (ref 31.5–35.7)
MCV: 82 fL (ref 79–97)
Monocytes Absolute: 0.4 10*3/uL (ref 0.1–0.9)
Monocytes: 7 %
Neutrophils Absolute: 3.1 10*3/uL (ref 1.4–7.0)
Neutrophils: 50 %
Platelets: 308 10*3/uL (ref 150–450)
RBC: 4.87 x10E6/uL (ref 3.77–5.28)
RDW: 12 % (ref 11.7–15.4)
WBC: 6 10*3/uL (ref 3.4–10.8)

## 2021-06-03 LAB — THYROID PANEL WITH TSH
Free Thyroxine Index: 1.8 (ref 1.2–4.9)
T3 Uptake Ratio: 29 % (ref 24–39)
T4, Total: 6.2 ug/dL (ref 4.5–12.0)
TSH: 0.668 u[IU]/mL (ref 0.450–4.500)

## 2021-06-03 LAB — VITAMIN D 25 HYDROXY (VIT D DEFICIENCY, FRACTURES): Vit D, 25-Hydroxy: 44.1 ng/mL (ref 30.0–100.0)

## 2021-06-03 NOTE — Assessment & Plan Note (Signed)
Stable, will check vitamin D lab level today

## 2021-06-04 DIAGNOSIS — Z419 Encounter for procedure for purposes other than remedying health state, unspecified: Secondary | ICD-10-CM | POA: Diagnosis not present

## 2021-06-08 ENCOUNTER — Other Ambulatory Visit: Payer: Self-pay | Admitting: Medical

## 2021-06-09 ENCOUNTER — Ambulatory Visit (INDEPENDENT_AMBULATORY_CARE_PROVIDER_SITE_OTHER): Payer: 59 | Admitting: Medical

## 2021-06-09 ENCOUNTER — Other Ambulatory Visit: Payer: Self-pay

## 2021-06-09 ENCOUNTER — Other Ambulatory Visit: Payer: Self-pay | Admitting: Neurology

## 2021-06-09 ENCOUNTER — Encounter: Payer: Self-pay | Admitting: Medical

## 2021-06-09 VITALS — BP 120/84 | HR 72 | Ht 63.0 in | Wt 159.8 lb

## 2021-06-09 DIAGNOSIS — R7989 Other specified abnormal findings of blood chemistry: Secondary | ICD-10-CM

## 2021-06-09 DIAGNOSIS — Z6828 Body mass index (BMI) 28.0-28.9, adult: Secondary | ICD-10-CM | POA: Insufficient documentation

## 2021-06-09 DIAGNOSIS — E559 Vitamin D deficiency, unspecified: Secondary | ICD-10-CM

## 2021-06-09 MED ORDER — OZEMPIC (2 MG/DOSE) 8 MG/3ML ~~LOC~~ SOPN
2.0000 mg | PEN_INJECTOR | SUBCUTANEOUS | 2 refills | Status: DC
Start: 2021-06-09 — End: 2021-09-08

## 2021-06-09 MED ORDER — UBRELVY 100 MG PO TABS
ORAL_TABLET | ORAL | 0 refills | Status: DC
  Filled 2021-06-09: qty 30, 30d supply, fill #0

## 2021-06-09 NOTE — Telephone Encounter (Signed)
Patient has an appt In August with Dr. Everlena Cooper for a year follow up

## 2021-06-09 NOTE — Progress Notes (Signed)
Subjective:  Mariah Ferrell is a 41 y.o. female who presents for Chief Complaint  Patient presents with   Follow-up    Weight follow up from February. No complaints.      Here for follow-up on weight loss efforts.  When she started Ozempic back in the fall 2022 she was 211 pounds.  She continues to see success with Ozempic.  She just started on the higher dose 2 mg last week.  She is doing beach body exercise program 3-4 times per week.  She is limiting calories.  She does not specifically track calories but is eating less overall.  She often does not eat breakfast given her significant history of migraines.  She knows that foods such as sausage and bacon and chicken and very give her headaches.  She is considering going to more of a pescatarian diet.  No other aggravating or relieving factors.    No other c/o.   Past Medical History:  Diagnosis Date   Constipation    chronic   GERD (gastroesophageal reflux disease)    HLA B27 positive    IUD (intrauterine device) in place    Mirena   Migraines    Current Outpatient Medications on File Prior to Visit  Medication Sig Dispense Refill   buPROPion (WELLBUTRIN XL) 150 MG 24 hr tablet Take 150 mg by mouth 2 (two) times daily.     clonazePAM (KLONOPIN) 1 MG tablet Take 1/2 to 1 tablet by mouth at bedtime as needed for sleep 30 tablet 2   cyclobenzaprine (FLEXERIL) 10 MG tablet Take 1 tablet (10 mg total) by mouth 3 (three) times daily as needed for muscle spasms. 30 tablet 5   estradiol (ESTRACE) 0.5 MG tablet Take 1 tablet by mouth every day 90 tablet 4   Fremanezumab-vfrm (AJOVY) 225 MG/1.5ML SOSY Inject 1 syringe (225 mg) into the skin every 28 days. 1.5 mL 4   Insulin Pen Needle (BD PEN NEEDLE NANO U/F) 32G X 4 MM MISC 1 each by Does not apply route at bedtime. 100 each 11   levonorgestrel (MIRENA) 20 MCG/24HR IUD 1 each by Intrauterine route once. June 2016     Plecanatide (TRULANCE) 3 MG TABS Take 1 tablet by mouth daily. 90  tablet 0   senna (SENOKOT) 8.6 MG TABS tablet Take 1 tablet by mouth.     ibuprofen (ADVIL) 600 MG tablet Take 600 mg by mouth 3 (three) times daily. (Patient not taking: Reported on 06/09/2021)     omeprazole (PRILOSEC) 40 MG capsule TAKE 1 CAPSULE BY MOUTH EVERY DAY (Patient not taking: Reported on 06/09/2021) 90 capsule 0   promethazine (PHENERGAN) 25 MG tablet Take 1 tablet (25 mg total) by mouth every 8 (eight) hours as needed. (Patient not taking: Reported on 06/09/2021) 30 tablet 5   Rimegepant Sulfate (NURTEC) 75 MG TBDP Take 75 mg by mouth as needed (take 1 tab at the earlist onset of a Migraine.  max 1 tab in 24 hours). (Patient not taking: Reported on 06/09/2021) 16 tablet 3   rizatriptan (MAXALT) 10 MG tablet TAKE 1 TABLET AS NEEDED FOR MIGRAINE. MAY REPEAT IN 2 HOURS IF NEEDED. MAX 2 TABS/24HRS (Patient not taking: Reported on 06/09/2021) 10 tablet 5   Ubrogepant (UBRELVY) 100 MG TABS Take at earliest onset of migraine. May repeat dose after 2 hours if needed (Patient not taking: Reported on 06/09/2021) 30 tablet 2   No current facility-administered medications on file prior to visit.   The following  portions of the patient's history were reviewed and updated as appropriate: allergies, current medications, past family history, past medical history, past social history, past surgical history and problem list.  ROS Otherwise as in subjective above    Objective: BP 120/84   Pulse 72   Ht '5\' 3"'  (1.6 m)   Wt 159 lb 12.8 oz (72.5 kg)   LMP  (Exact Date)   BMI 28.31 kg/m   Wt Readings from Last 3 Encounters:  06/09/21 159 lb 12.8 oz (72.5 kg)  06/02/21 163 lb 3.2 oz (74 kg)  02/23/21 174 lb 12.8 oz (79.3 kg)    General appearance: alert, no distress, well developed, well nourished Otherwise not examined    Assessment: Encounter Diagnoses  Name Primary?   BMI 28.0-28.9,adult Yes   Elevated serum creatinine    Vitamin D deficiency      Plan: She has had a lot of success with  Ozempic.  Continue current medication.  Continue efforts with healthy diet and exercise, consider going with pescetarian diet as we discussed.  Elevated creatinine-she has had slightly elevated creatinine on 2 recent labs.  Urine did not show protein noted.  She does not have diabetes or high blood pressure.  I suspect this could be related to NSAID use or some other insult.  Advise she avoid NSAIDs altogether, hydrate well every day as discussed.  Plan to recheck BMET labs in 1 month  Continue vitamin D supplement  Mariah Ferrell was seen today for follow-up.  Diagnoses and all orders for this visit:  BMI 28.0-28.9,adult  Elevated serum creatinine -     Basic metabolic panel; Future  Vitamin D deficiency  Other orders -     Semaglutide, 2 MG/DOSE, (OZEMPIC, 2 MG/DOSE,) 8 MG/3ML SOPN; Inject 2 mg into the skin once a week.    Follow up: [facility] lab, nurse visit

## 2021-06-29 ENCOUNTER — Other Ambulatory Visit: Payer: Self-pay | Admitting: Neurology

## 2021-06-29 ENCOUNTER — Other Ambulatory Visit: Payer: Self-pay

## 2021-06-29 MED ORDER — AJOVY 225 MG/1.5ML ~~LOC~~ SOSY
225.0000 mg | PREFILLED_SYRINGE | SUBCUTANEOUS | 0 refills | Status: DC
  Filled 2021-06-29: qty 1.5, 30d supply, fill #0

## 2021-06-30 ENCOUNTER — Other Ambulatory Visit: Payer: Self-pay

## 2021-07-04 DIAGNOSIS — Z419 Encounter for procedure for purposes other than remedying health state, unspecified: Secondary | ICD-10-CM | POA: Diagnosis not present

## 2021-07-06 ENCOUNTER — Other Ambulatory Visit: Payer: Self-pay | Admitting: Internal Medicine

## 2021-07-06 ENCOUNTER — Other Ambulatory Visit: Payer: Self-pay

## 2021-07-06 MED ORDER — TRULANCE 3 MG PO TABS
1.0000 | ORAL_TABLET | Freq: Every day | ORAL | 0 refills | Status: DC
  Filled 2021-07-06 – 2021-08-08 (×2): qty 30, 30d supply, fill #0
  Filled 2021-10-12: qty 90, 90d supply, fill #0

## 2021-07-13 ENCOUNTER — Other Ambulatory Visit: Payer: 59

## 2021-07-13 DIAGNOSIS — R7989 Other specified abnormal findings of blood chemistry: Secondary | ICD-10-CM

## 2021-07-14 LAB — BASIC METABOLIC PANEL
BUN/Creatinine Ratio: 8 — ABNORMAL LOW (ref 9–23)
BUN: 9 mg/dL (ref 6–24)
CO2: 25 mmol/L (ref 20–29)
Calcium: 9 mg/dL (ref 8.7–10.2)
Chloride: 102 mmol/L (ref 96–106)
Creatinine, Ser: 1.08 mg/dL — ABNORMAL HIGH (ref 0.57–1.00)
Glucose: 79 mg/dL (ref 70–99)
Potassium: 4 mmol/L (ref 3.5–5.2)
Sodium: 140 mmol/L (ref 134–144)
eGFR: 66 mL/min/{1.73_m2} (ref >59)

## 2021-07-17 ENCOUNTER — Other Ambulatory Visit: Payer: Self-pay

## 2021-07-20 ENCOUNTER — Other Ambulatory Visit: Payer: Self-pay

## 2021-07-20 ENCOUNTER — Other Ambulatory Visit: Payer: Self-pay | Admitting: Neurology

## 2021-07-21 ENCOUNTER — Other Ambulatory Visit: Payer: Self-pay

## 2021-07-21 ENCOUNTER — Other Ambulatory Visit: Payer: Self-pay | Admitting: Neurology

## 2021-07-21 MED ORDER — AJOVY 225 MG/1.5ML ~~LOC~~ SOSY
225.0000 mg | PREFILLED_SYRINGE | SUBCUTANEOUS | 0 refills | Status: DC
  Filled 2021-07-21: qty 1.5, 28d supply, fill #0

## 2021-08-04 DIAGNOSIS — Z419 Encounter for procedure for purposes other than remedying health state, unspecified: Secondary | ICD-10-CM | POA: Diagnosis not present

## 2021-08-08 ENCOUNTER — Other Ambulatory Visit: Payer: Self-pay | Admitting: Neurology

## 2021-08-10 ENCOUNTER — Other Ambulatory Visit: Payer: Self-pay

## 2021-08-10 NOTE — Telephone Encounter (Signed)
I left message to contact the office regarding RX.

## 2021-08-11 ENCOUNTER — Telehealth (HOSPITAL_COMMUNITY): Payer: Self-pay | Admitting: Pharmacy Technician

## 2021-08-11 NOTE — Telephone Encounter (Signed)
Patient Advocate Encounter  Prior Authorization for Ubrelvy 100 mg tablets  has been approved.    PA# [id number] Effective dates: 08/11/2021 through 08/11/2022      Roland Earl, CPhT Pharmacy Patient Advocate Specialist [facility] Patient Advocate Team Direct Number: [phone]  Fax: [phone]

## 2021-08-11 NOTE — Telephone Encounter (Signed)
Patient Advocate Encounter   Received notification from Ubrelvy 100 mg tablets that prior authorization for is required.   PA submitted on 08/11/2021 Key [id number] Status is pending       Roland Earl, CPhT Pharmacy Patient Advocate Specialist [facility] Patient Advocate Team Direct Number: [phone]  Fax: [phone]

## 2021-08-12 ENCOUNTER — Encounter: Payer: Self-pay | Admitting: Neurology

## 2021-08-13 ENCOUNTER — Other Ambulatory Visit: Payer: Self-pay

## 2021-08-13 MED ORDER — UBRELVY 100 MG PO TABS: ORAL_TABLET | ORAL | 0 refills | Status: DC

## 2021-08-27 DIAGNOSIS — F4322 Adjustment disorder with anxiety: Secondary | ICD-10-CM | POA: Diagnosis not present
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
## 2021-08-28 ENCOUNTER — Telehealth: Payer: Self-pay

## 2021-08-28 ENCOUNTER — Encounter: Payer: Self-pay | Admitting: Neurology

## 2021-08-28 ENCOUNTER — Ambulatory Visit (INDEPENDENT_AMBULATORY_CARE_PROVIDER_SITE_OTHER): Payer: BC Managed Care – PPO | Admitting: Neurology

## 2021-08-28 VITALS — BP 138/92 | HR 95 | Ht 63.0 in | Wt 153.4 lb

## 2021-08-28 DIAGNOSIS — M545 Low back pain, unspecified: Secondary | ICD-10-CM | POA: Diagnosis not present

## 2021-08-28 DIAGNOSIS — G43709 Chronic migraine without aura, not intractable, without status migrainosus: Secondary | ICD-10-CM

## 2021-08-28 MED ORDER — AJOVY 225 MG/1.5ML ~~LOC~~ SOSY: 225.0000 mg | PREFILLED_SYRINGE | SUBCUTANEOUS | 1 refills | Status: DC

## 2021-08-28 MED ORDER — UBRELVY 100 MG PO TABS: ORAL_TABLET | ORAL | 5 refills | Status: DC

## 2021-08-28 MED ORDER — RIZATRIPTAN BENZOATE 10 MG PO TABS: ORAL_TABLET | ORAL | 5 refills | Status: DC

## 2021-08-28 MED ORDER — NURTEC 75 MG PO TBDP: 75.0000 mg | ORAL_TABLET | ORAL | 5 refills | Status: DC | PRN

## 2021-08-28 MED ORDER — PROMETHAZINE HCL 25 MG PO TABS
25.0000 mg | ORAL_TABLET | Freq: Three times a day (TID) | ORAL | 5 refills | Status: DC | PRN
Start: 2021-08-28 — End: 2022-07-23

## 2021-08-28 MED ORDER — CYCLOBENZAPRINE HCL 10 MG PO TABS: 10.0000 mg | ORAL_TABLET | Freq: Three times a day (TID) | ORAL | 5 refills | Status: DC | PRN

## 2021-08-28 NOTE — Telephone Encounter (Signed)
Patient seen in office today. Per Dr.Jaffe patient to start Botox. PA team please start a PA for 200 units Botox

## 2021-08-28 NOTE — Patient Instructions (Addendum)
Plan to change from Ajovy to Botox

## 2021-08-31 ENCOUNTER — Other Ambulatory Visit (HOSPITAL_COMMUNITY): Payer: Self-pay

## 2021-08-31 ENCOUNTER — Telehealth (HOSPITAL_COMMUNITY): Payer: Self-pay | Admitting: Pharmacy Technician

## 2021-08-31 NOTE — Telephone Encounter (Signed)
Patient Advocate Encounter   Received notification that prior authorization for Botox 200UNIT solution is required.   PA submitted on 08/31/2021 Key [id number] Status is pending       Roland Earl, CPhT Pharmacy Patient Advocate Specialist [facility] Patient Advocate Team Direct Number: [phone]  Fax: [phone]

## 2021-08-31 NOTE — Telephone Encounter (Signed)
Submitted benefit inquiry to [employer]- [id number]

## 2021-09-01 ENCOUNTER — Encounter: Payer: Self-pay | Admitting: Neurology

## 2021-09-01 ENCOUNTER — Other Ambulatory Visit (HOSPITAL_COMMUNITY): Payer: Self-pay

## 2021-09-01 MED ORDER — ONABOTULINUMTOXINA 200 UNITS IJ SOLR
200.0000 [IU] | INTRAMUSCULAR | 4 refills | Status: DC
  Filled 2021-09-01: qty 1, 90d supply, fill #0
  Filled 2021-09-14: qty 1, 30d supply, fill #0
  Filled 2021-12-07: qty 1, 30d supply, fill #1
  Filled 2022-03-03: qty 1, 30d supply, fill #2
  Filled 2022-06-02 – 2022-06-14 (×4): qty 1, 30d supply, fill #3

## 2021-09-01 NOTE — Telephone Encounter (Signed)
Patient Advocate Encounter   Received notification that prior authorization for Botox 200UNIT solution is required through [facility].   PA submitted on 09/01/2021 Key [id number] Status is pending       Roland Earl, CPhT Pharmacy Patient Advocate Specialist [facility] Patient Advocate Team Direct Number: [phone]  Fax: [phone]

## 2021-09-01 NOTE — Telephone Encounter (Signed)
Tried calling patient no answer. Botox approved.  Script sent to [facility].

## 2021-09-01 NOTE — Telephone Encounter (Signed)
Botox one results:

## 2021-09-01 NOTE — Addendum Note (Signed)
Addended by: Leida Lauth on: 09/01/2021 12:59 PM   Modules accepted: Orders

## 2021-09-01 NOTE — Telephone Encounter (Addendum)
Patient Advocate Encounter  Prior Authorization for Botox 200UNIT solution has been approved for [employer].     Effective dates: 08/31/2021 through 02/27/2022  Can be filled at [facility]    Roland Earl, CPhT Pharmacy Patient Advocate Specialist [facility] Patient Advocate Team Direct Number: [phone]  Fax: [phone]

## 2021-09-01 NOTE — Telephone Encounter (Signed)
Patient Advocate Encounter  Prior Authorization for Botox 200UNIT solution  has been approved .through [facility] Medicaid of [place]    PA# [id number] Effective dates: 08/31/2021 through 02/27/2022  Needs to be filled at [facility]    Roland Earl, CPhT Pharmacy Patient Advocate Specialist [facility] Patient Advocate Team Direct Number: [phone]  Fax: [phone]

## 2021-09-02 DIAGNOSIS — F4322 Adjustment disorder with anxiety: Secondary | ICD-10-CM | POA: Diagnosis not present

## 2021-09-04 DIAGNOSIS — Z419 Encounter for procedure for purposes other than remedying health state, unspecified: Secondary | ICD-10-CM | POA: Diagnosis not present

## 2021-09-07 ENCOUNTER — Other Ambulatory Visit: Payer: Self-pay | Admitting: Medical

## 2021-09-08 ENCOUNTER — Other Ambulatory Visit: Payer: Self-pay

## 2021-09-08 ENCOUNTER — Other Ambulatory Visit (HOSPITAL_COMMUNITY): Payer: Self-pay

## 2021-09-09 ENCOUNTER — Encounter: Payer: Self-pay | Admitting: Internal Medicine

## 2021-09-10 DIAGNOSIS — F4322 Adjustment disorder with anxiety: Secondary | ICD-10-CM | POA: Diagnosis not present

## 2021-09-14 ENCOUNTER — Other Ambulatory Visit (HOSPITAL_COMMUNITY): Payer: Self-pay

## 2021-09-17 ENCOUNTER — Ambulatory Visit (INDEPENDENT_AMBULATORY_CARE_PROVIDER_SITE_OTHER): Payer: BC Managed Care – PPO | Admitting: Neurology

## 2021-09-17 DIAGNOSIS — G43709 Chronic migraine without aura, not intractable, without status migrainosus: Secondary | ICD-10-CM | POA: Diagnosis not present

## 2021-09-17 MED ORDER — ONABOTULINUMTOXINA 100 UNITS IJ SOLR
200.0000 [IU] | Freq: Once | INTRAMUSCULAR | Status: AC
  Administered 2021-09-17: 200 [IU] via INTRAMUSCULAR

## 2021-09-18 NOTE — Progress Notes (Signed)
Botulinum Clinic  ° °Procedure Note Botox ° °Attending: Dr. Bart Ashford ° °Preoperative Diagnosis(es): Chronic migraine ° °Consent obtained from: The patient °Benefits discussed included, but were not limited to decreased muscle tightness, increased joint range of motion, and decreased pain.  Risk discussed included, but were not limited pain and discomfort, bleeding, bruising, excessive weakness, venous thrombosis, muscle atrophy and dysphagia.  Anticipated outcomes of the procedure as well as he risks and benefits of the alternatives to the procedure, and the roles and tasks of the personnel to be involved, were discussed with the patient, and the patient consents to the procedure and agrees to proceed. A copy of the patient medication guide was given to the patient which explains the blackbox warning. ° °Patients identity and treatment sites confirmed Yes.  . ° °Details of Procedure: °Skin was cleaned with alcohol. Prior to injection, the needle plunger was aspirated to make sure the needle was not within a blood vessel.  There was no blood retrieved on aspiration.   ° °Following is a summary of the muscles injected  And the amount of Botulinum toxin used: ° °Dilution °200 units of Botox was reconstituted with 4 ml of preservative free normal saline. °Time of reconstitution: At the time of the office visit (<30 minutes prior to injection)  ° °Injections  °155 total units of Botox was injected with a 30 gauge needle. ° °Injection Sites: °L occipitalis: 15 units- 3 sites  °R occiptalis: 15 units- 3 sites ° °L upper trapezius: 15 units- 3 sites °R upper trapezius: 15 units- 3 sits          °L paraspinal: 10 units- 2 sites °R paraspinal: 10 units- 2 sites ° °Face °L frontalis(2 injection sites):10 units   °R frontalis(2 injection sites):10 units         °L corrugator: 5 units   °R corrugator: 5 units           °Procerus: 5 units   °L temporalis: 20 units °R temporalis: 20 units  ° °Agent:  °200 units of botulinum Type  A (Onobotulinum Toxin type A) was reconstituted with 4 ml of preservative free normal saline.  °Time of reconstitution: At the time of the office visit (<30 minutes prior to injection)  ° ° ° Total injected (Units):  155 ° Total wasted (Units):  45 ° °Patient tolerated procedure well without complications.   °Reinjection is anticipated in 3 months. ° ° °

## 2021-09-21 ENCOUNTER — Other Ambulatory Visit (HOSPITAL_COMMUNITY): Payer: Self-pay

## 2021-09-22 DIAGNOSIS — F4322 Adjustment disorder with anxiety: Secondary | ICD-10-CM | POA: Diagnosis not present

## 2021-10-04 DIAGNOSIS — Z419 Encounter for procedure for purposes other than remedying health state, unspecified: Secondary | ICD-10-CM | POA: Diagnosis not present

## 2021-10-07 DIAGNOSIS — F4322 Adjustment disorder with anxiety: Secondary | ICD-10-CM | POA: Diagnosis not present

## 2021-10-12 ENCOUNTER — Other Ambulatory Visit: Payer: Self-pay

## 2021-10-12 DIAGNOSIS — Z202 Contact with and (suspected) exposure to infections with a predominantly sexual mode of transmission: Secondary | ICD-10-CM | POA: Diagnosis not present

## 2021-10-12 DIAGNOSIS — Z01419 Encounter for gynecological examination (general) (routine) without abnormal findings: Secondary | ICD-10-CM | POA: Diagnosis not present

## 2021-10-12 DIAGNOSIS — Z124 Encounter for screening for malignant neoplasm of cervix: Secondary | ICD-10-CM | POA: Diagnosis not present

## 2021-10-12 DIAGNOSIS — Z1151 Encounter for screening for human papillomavirus (HPV): Secondary | ICD-10-CM | POA: Diagnosis not present

## 2021-10-12 DIAGNOSIS — R87612 Low grade squamous intraepithelial lesion on cytologic smear of cervix (LGSIL): Secondary | ICD-10-CM | POA: Diagnosis not present

## 2021-10-12 DIAGNOSIS — N898 Other specified noninflammatory disorders of vagina: Secondary | ICD-10-CM | POA: Diagnosis not present

## 2021-10-12 DIAGNOSIS — Z130 Encounter for screening for diseases of the blood and blood-forming organs and certain disorders involving the immune mechanism: Secondary | ICD-10-CM | POA: Diagnosis not present

## 2021-10-12 DIAGNOSIS — Z30433 Encounter for removal and reinsertion of intrauterine contraceptive device: Secondary | ICD-10-CM | POA: Diagnosis not present

## 2021-10-12 LAB — RESULTS CONSOLE HPV: CHL HPV: NEGATIVE

## 2021-10-12 LAB — HM PAP SMEAR

## 2021-10-12 MED ORDER — ESTRADIOL 0.5 MG PO TABS
0.5000 mg | ORAL_TABLET | Freq: Every day | ORAL | 4 refills | Status: DC
  Filled 2021-10-12: qty 90, 90d supply, fill #0
  Filled 2022-01-17: qty 90, 90d supply, fill #1
  Filled 2022-01-25: qty 90, 90d supply, fill #0

## 2021-10-13 ENCOUNTER — Encounter: Payer: Self-pay | Admitting: Internal Medicine

## 2021-10-13 ENCOUNTER — Other Ambulatory Visit: Payer: Self-pay

## 2021-10-13 ENCOUNTER — Other Ambulatory Visit: Payer: Self-pay | Admitting: Medical

## 2021-10-13 MED ORDER — BUPROPION HCL ER (XL) 300 MG PO TB24
300.0000 mg | ORAL_TABLET | Freq: Every day | ORAL | 0 refills | Status: AC
  Filled 2021-10-13: qty 90, 90d supply, fill #0

## 2021-10-15 ENCOUNTER — Other Ambulatory Visit: Payer: Self-pay

## 2021-10-15 MED ORDER — METRONIDAZOLE 500 MG PO TABS
ORAL_TABLET | ORAL | 0 refills | Status: DC
  Filled 2021-10-15: qty 14, 7d supply, fill #0

## 2021-10-17 ENCOUNTER — Telehealth: Payer: Self-pay | Admitting: Medical

## 2021-10-17 NOTE — Telephone Encounter (Signed)
P.A. OZEMPIC RENEWAL  

## 2021-10-21 ENCOUNTER — Other Ambulatory Visit: Payer: Self-pay | Admitting: Medical

## 2021-10-21 MED ORDER — SCOPOLAMINE 1 MG/3DAYS TD PT72: 1.0000 | MEDICATED_PATCH | TRANSDERMAL | 0 refills | Status: DC

## 2021-10-22 ENCOUNTER — Other Ambulatory Visit (HOSPITAL_COMMUNITY): Payer: Self-pay

## 2021-10-22 ENCOUNTER — Telehealth: Payer: Self-pay

## 2021-10-22 ENCOUNTER — Encounter: Payer: Self-pay | Admitting: Neurology

## 2021-10-22 NOTE — Telephone Encounter (Signed)
  Received notification from [employer] that prior authorization is required for Ubrelvy 100MG  tablets. PA submitted and APPROVED on 10/22/2021.  Key [id number] Effective: 09/22/2021 - 10/22/2022

## 2021-10-23 NOTE — Telephone Encounter (Signed)
P.A. denied thru [place] only covered for Type 2 diabetes. Sent pt mychart message

## 2021-10-26 NOTE — Telephone Encounter (Signed)
See note 10/24/21

## 2021-11-03 DIAGNOSIS — F331 Major depressive disorder, recurrent, moderate: Secondary | ICD-10-CM | POA: Diagnosis not present

## 2021-11-03 DIAGNOSIS — F411 Generalized anxiety disorder: Secondary | ICD-10-CM | POA: Diagnosis not present

## 2021-11-04 DIAGNOSIS — F4322 Adjustment disorder with anxiety: Secondary | ICD-10-CM | POA: Diagnosis not present

## 2021-11-04 DIAGNOSIS — Z419 Encounter for procedure for purposes other than remedying health state, unspecified: Secondary | ICD-10-CM | POA: Diagnosis not present

## 2021-11-11 ENCOUNTER — Other Ambulatory Visit: Payer: Self-pay | Admitting: Medical

## 2021-11-23 DIAGNOSIS — Z30431 Encounter for routine checking of intrauterine contraceptive device: Secondary | ICD-10-CM | POA: Diagnosis not present

## 2021-11-23 DIAGNOSIS — N899 Noninflammatory disorder of vagina, unspecified: Secondary | ICD-10-CM | POA: Diagnosis not present

## 2021-11-23 DIAGNOSIS — Z23 Encounter for immunization: Secondary | ICD-10-CM | POA: Diagnosis not present

## 2021-11-23 DIAGNOSIS — L138 Other specified bullous disorders: Secondary | ICD-10-CM | POA: Diagnosis not present

## 2021-11-24 ENCOUNTER — Encounter: Payer: Self-pay | Admitting: Internal Medicine

## 2021-11-24 DIAGNOSIS — F4322 Adjustment disorder with anxiety: Secondary | ICD-10-CM | POA: Diagnosis not present

## 2021-12-01 ENCOUNTER — Other Ambulatory Visit (HOSPITAL_COMMUNITY): Payer: Self-pay

## 2021-12-03 ENCOUNTER — Other Ambulatory Visit (HOSPITAL_COMMUNITY): Payer: Self-pay

## 2021-12-04 DIAGNOSIS — Z419 Encounter for procedure for purposes other than remedying health state, unspecified: Secondary | ICD-10-CM | POA: Diagnosis not present

## 2021-12-07 ENCOUNTER — Other Ambulatory Visit (HOSPITAL_COMMUNITY): Payer: Self-pay

## 2021-12-08 ENCOUNTER — Other Ambulatory Visit (HOSPITAL_COMMUNITY): Payer: Self-pay

## 2021-12-09 ENCOUNTER — Encounter: Payer: Self-pay | Admitting: Neurology

## 2021-12-14 ENCOUNTER — Other Ambulatory Visit (HOSPITAL_COMMUNITY): Payer: Self-pay

## 2021-12-15 ENCOUNTER — Other Ambulatory Visit: Payer: Self-pay | Admitting: Medical

## 2021-12-15 DIAGNOSIS — F4322 Adjustment disorder with anxiety: Secondary | ICD-10-CM | POA: Diagnosis not present

## 2021-12-18 ENCOUNTER — Ambulatory Visit: Payer: BC Managed Care – PPO | Admitting: Neurology

## 2021-12-18 ENCOUNTER — Other Ambulatory Visit: Payer: Self-pay | Admitting: Medical

## 2021-12-18 MED ORDER — ZEPBOUND 2.5 MG/0.5ML ~~LOC~~ SOAJ: 2.5000 mg | SUBCUTANEOUS | 0 refills | Status: DC

## 2021-12-22 ENCOUNTER — Ambulatory Visit (INDEPENDENT_AMBULATORY_CARE_PROVIDER_SITE_OTHER): Payer: BC Managed Care – PPO | Admitting: Neurology

## 2021-12-22 DIAGNOSIS — G43709 Chronic migraine without aura, not intractable, without status migrainosus: Secondary | ICD-10-CM | POA: Diagnosis not present

## 2021-12-22 MED ORDER — ONABOTULINUMTOXINA 100 UNITS IJ SOLR
200.0000 [IU] | Freq: Once | INTRAMUSCULAR | Status: AC
  Administered 2021-12-22: 155 [IU] via INTRAMUSCULAR

## 2021-12-22 NOTE — Progress Notes (Signed)
Botulinum Clinic  ° °Procedure Note Botox ° °Attending: Dr. Tagen Milby ° °Preoperative Diagnosis(es): Chronic migraine ° °Consent obtained from: The patient °Benefits discussed included, but were not limited to decreased muscle tightness, increased joint range of motion, and decreased pain.  Risk discussed included, but were not limited pain and discomfort, bleeding, bruising, excessive weakness, venous thrombosis, muscle atrophy and dysphagia.  Anticipated outcomes of the procedure as well as he risks and benefits of the alternatives to the procedure, and the roles and tasks of the personnel to be involved, were discussed with the patient, and the patient consents to the procedure and agrees to proceed. A copy of the patient medication guide was given to the patient which explains the blackbox warning. ° °Patients identity and treatment sites confirmed Yes.  . ° °Details of Procedure: °Skin was cleaned with alcohol. Prior to injection, the needle plunger was aspirated to make sure the needle was not within a blood vessel.  There was no blood retrieved on aspiration.   ° °Following is a summary of the muscles injected  And the amount of Botulinum toxin used: ° °Dilution °200 units of Botox was reconstituted with 4 ml of preservative free normal saline. °Time of reconstitution: At the time of the office visit (<30 minutes prior to injection)  ° °Injections  °155 total units of Botox was injected with a 30 gauge needle. ° °Injection Sites: °L occipitalis: 15 units- 3 sites  °R occiptalis: 15 units- 3 sites ° °L upper trapezius: 15 units- 3 sites °R upper trapezius: 15 units- 3 sits          °L paraspinal: 10 units- 2 sites °R paraspinal: 10 units- 2 sites ° °Face °L frontalis(2 injection sites):10 units   °R frontalis(2 injection sites):10 units         °L corrugator: 5 units   °R corrugator: 5 units           °Procerus: 5 units   °L temporalis: 20 units °R temporalis: 20 units  ° °Agent:  °200 units of botulinum Type  A (Onobotulinum Toxin type A) was reconstituted with 4 ml of preservative free normal saline.  °Time of reconstitution: At the time of the office visit (<30 minutes prior to injection)  ° ° ° Total injected (Units):  155 ° Total wasted (Units):  45 ° °Patient tolerated procedure well without complications.   °Reinjection is anticipated in 3 months. ° ° °

## 2021-12-23 ENCOUNTER — Other Ambulatory Visit: Payer: Self-pay | Admitting: Medical

## 2021-12-23 MED ORDER — LINACLOTIDE 145 MCG PO CAPS: 145.0000 ug | ORAL_CAPSULE | Freq: Every day | ORAL | 0 refills | Status: DC

## 2021-12-25 ENCOUNTER — Other Ambulatory Visit: Payer: Self-pay | Admitting: Medical

## 2022-01-04 DIAGNOSIS — Z419 Encounter for procedure for purposes other than remedying health state, unspecified: Secondary | ICD-10-CM | POA: Diagnosis not present

## 2022-01-18 ENCOUNTER — Other Ambulatory Visit: Payer: Self-pay

## 2022-01-19 ENCOUNTER — Telehealth: Payer: Self-pay | Admitting: Medical

## 2022-01-19 NOTE — Telephone Encounter (Signed)
Called pharmacy regarding the Zepbound & they state that [facility] are rejecting stating drug not covered.  No option for P.A. I went ahead & completed P.A. online and was given the same rejection drug not covered with no other options.  Sent pt MyChart message

## 2022-01-22 ENCOUNTER — Telehealth: Payer: Self-pay | Admitting: Medical

## 2022-01-22 ENCOUNTER — Other Ambulatory Visit: Payer: Self-pay

## 2022-01-22 NOTE — Telephone Encounter (Signed)
Pt was going to schedule a fu but she has not been taking a weight loss medicine due to trying to still figure out which one her insurance would cover. She also states she was having a pa done but at this point she wasn't sure which medication she was supposed to be switching to due to Ozempic being not available anymore. Pt needs clarification.

## 2022-01-23 NOTE — Telephone Encounter (Signed)
I have messaged this patient & let her know we do not have her current insurance so I was unable to do her P.A., I asked her to get her new insurance info to me

## 2022-01-23 NOTE — Telephone Encounter (Signed)
I am waiting on patient to get back with me on her current insurance information, you last prescribed Zepbound but I tried doing the P.A. but her insurance was inactive.

## 2022-01-23 NOTE — Telephone Encounter (Signed)
See telephone call waiting on correct insurance

## 2022-01-25 ENCOUNTER — Other Ambulatory Visit: Payer: Self-pay

## 2022-01-29 ENCOUNTER — Telehealth: Payer: Self-pay | Admitting: Neurology

## 2022-01-29 ENCOUNTER — Other Ambulatory Visit: Payer: Self-pay

## 2022-01-29 NOTE — Telephone Encounter (Signed)
Called and left a detailed message per DPR that per Dr. Tomi Likens If you feel you're doing well, it should be no problem.  A third round of Botox may actually make a difference. Left our contact information incase patient had any questions or concerns.

## 2022-01-29 NOTE — Telephone Encounter (Signed)
Pt called to cancel her 02/17/21 appointment. She will be out of town. She wanted to double check with Dr. Tomi Likens if it's ok to wait until the next available of 06/09/22? She didn't want to wait too long in case he needs to find out how her botox is doing.

## 2022-02-04 DIAGNOSIS — Z419 Encounter for procedure for purposes other than remedying health state, unspecified: Secondary | ICD-10-CM | POA: Diagnosis not present

## 2022-02-05 ENCOUNTER — Other Ambulatory Visit: Payer: Self-pay | Admitting: Medical

## 2022-02-12 NOTE — Telephone Encounter (Signed)
Zepbound plan exclusion with old insurance,, still haven't gotten new insurance card from pt

## 2022-02-17 ENCOUNTER — Ambulatory Visit: Payer: BC Managed Care – PPO | Admitting: Neurology

## 2022-02-24 DIAGNOSIS — Z23 Encounter for immunization: Secondary | ICD-10-CM | POA: Diagnosis not present

## 2022-02-26 ENCOUNTER — Other Ambulatory Visit (HOSPITAL_COMMUNITY): Payer: Self-pay

## 2022-03-03 ENCOUNTER — Other Ambulatory Visit (HOSPITAL_COMMUNITY): Payer: Self-pay

## 2022-03-04 ENCOUNTER — Other Ambulatory Visit (HOSPITAL_COMMUNITY): Payer: Self-pay

## 2022-03-04 DIAGNOSIS — F4322 Adjustment disorder with anxiety: Secondary | ICD-10-CM | POA: Diagnosis not present

## 2022-03-05 DIAGNOSIS — Z419 Encounter for procedure for purposes other than remedying health state, unspecified: Secondary | ICD-10-CM | POA: Diagnosis not present

## 2022-03-10 ENCOUNTER — Other Ambulatory Visit (HOSPITAL_COMMUNITY): Payer: Self-pay

## 2022-03-10 ENCOUNTER — Other Ambulatory Visit: Payer: Self-pay

## 2022-03-12 ENCOUNTER — Other Ambulatory Visit (HOSPITAL_COMMUNITY): Payer: Self-pay

## 2022-03-23 ENCOUNTER — Ambulatory Visit (INDEPENDENT_AMBULATORY_CARE_PROVIDER_SITE_OTHER): Payer: BC Managed Care – PPO | Admitting: Neurology

## 2022-03-23 DIAGNOSIS — G43709 Chronic migraine without aura, not intractable, without status migrainosus: Secondary | ICD-10-CM | POA: Diagnosis not present

## 2022-03-23 MED ORDER — ONABOTULINUMTOXINA 100 UNITS IJ SOLR
200.0000 [IU] | Freq: Once | INTRAMUSCULAR | Status: AC
  Administered 2022-03-23: 155 [IU] via INTRAMUSCULAR

## 2022-03-23 MED ORDER — UBRELVY 100 MG PO TABS: 1.0000 | ORAL_TABLET | ORAL | 3 refills | Status: DC | PRN

## 2022-03-23 NOTE — Progress Notes (Unsigned)
[facility]  ° °Procedure Note Botox ° °Attending: Dr. Jin Shockley ° °Preoperative Diagnosis(es): Chronic migraine ° °Consent obtained from: The patient °Benefits discussed included, but were not limited to decreased muscle tightness, increased joint range of motion, and decreased pain.  Risk discussed included, but were not limited pain and discomfort, bleeding, bruising, excessive weakness, venous thrombosis, muscle atrophy and dysphagia.  Anticipated outcomes of the procedure as well as he risks and benefits of the alternatives to the procedure, and the roles and tasks of the personnel to be involved, were discussed with the patient, and the patient consents to the procedure and agrees to proceed. A copy of the patient medication guide was given to the patient which explains the blackbox warning. ° °Patients identity and treatment sites confirmed Yes.  . ° °Details of Procedure: °Skin was cleaned with alcohol. Prior to injection, the needle plunger was aspirated to make sure the needle was not within a blood vessel.  There was no blood retrieved on aspiration.   ° °Following is a summary of the muscles injected  And the amount of Botulinum toxin used: ° °Dilution °200 units of Botox was reconstituted with 4 ml of preservative free normal saline. °Time of reconstitution: At the time of the office visit (<30 minutes prior to injection)  ° °Injections  °155 total units of Botox was injected with a 30 gauge needle. ° °Injection Sites: °L occipitalis: 15 units- 3 sites  °R occiptalis: 15 units- 3 sites ° °L upper trapezius: 15 units- 3 sites °R upper trapezius: 15 units- 3 sits          °L paraspinal: 10 units- 2 sites °R paraspinal: 10 units- 2 sites ° °Face °L frontalis(2 injection sites):10 units   °R frontalis(2 injection sites):10 units         °L corrugator: 5 units   °R corrugator: 5 units           °Procerus: 5 units   °L temporalis: 20 units °R temporalis: 20 units  ° °Agent:  °200 units of botulinum Type  A (Onobotulinum Toxin type A) was reconstituted with 4 ml of preservative free normal saline.  °Time of reconstitution: At the time of the office visit (<30 minutes prior to injection)  ° ° ° Total injected (Units):  155 ° Total wasted (Units):  45 ° °Patient tolerated procedure well without complications.   °Reinjection is anticipated in 3 months. ° ° °

## 2022-03-28 ENCOUNTER — Other Ambulatory Visit: Payer: Self-pay | Admitting: Medical

## 2022-03-29 NOTE — Telephone Encounter (Signed)
Left message for pt to call back to schedule a visit as she is overdue for a visit

## 2022-04-05 DIAGNOSIS — Z419 Encounter for procedure for purposes other than remedying health state, unspecified: Secondary | ICD-10-CM | POA: Diagnosis not present

## 2022-04-07 ENCOUNTER — Ambulatory Visit (INDEPENDENT_AMBULATORY_CARE_PROVIDER_SITE_OTHER): Payer: BC Managed Care – PPO | Admitting: Medical

## 2022-04-07 VITALS — BP 120/70 | HR 81 | Wt 148.4 lb

## 2022-04-07 DIAGNOSIS — F325 Major depressive disorder, single episode, in full remission: Secondary | ICD-10-CM | POA: Diagnosis not present

## 2022-04-07 DIAGNOSIS — G4700 Insomnia, unspecified: Secondary | ICD-10-CM | POA: Diagnosis not present

## 2022-04-07 DIAGNOSIS — G43709 Chronic migraine without aura, not intractable, without status migrainosus: Secondary | ICD-10-CM | POA: Diagnosis not present

## 2022-04-07 DIAGNOSIS — K5909 Other constipation: Secondary | ICD-10-CM

## 2022-04-07 MED ORDER — LINACLOTIDE 145 MCG PO CAPS: ORAL_CAPSULE | ORAL | 1 refills | Status: DC

## 2022-04-07 NOTE — Progress Notes (Signed)
Subjective:  Mariah Ferrell is a 42 y.o. female who presents for Chief Complaint  Patient presents with   refills on meds    Refills on meds, psychiatrist is being very difficult and not getting them sent right- would like you to take over psych meds, if not wants another psych     Here for med check  Chronic constipation-currently on Linzess and does fine on this.  Needs refills.  History of insomnia-in the past was on melatonin which help with sleep initially but would not stay asleep.  She was transitioned to clonazepam which has worked a lot better for her.  Currently is prescribed this by psychiatry  History of depression-symptoms really started after having COVID in 2020.  She had problems with focus and mood.  She was on Lexapro at first but that did not work.  Wellbutrin has worked much better for her.  She has been seeing psychiatry but lately she is having trouble getting refills done timely or having trouble communicating with the office in terms of being able to get people on the phone.  She may want to start having [place] at our office refill her medicines for sleep and mood.  She notes in the last year she has more of a flat affect and mood in the winter months.  But come spring does better.    She was on weight loss medicine but not currently taking this.  She is happy with her current weight.  Eating 3 times per daily Exercise - some.  Takes the stairs daily multiple times a day at her office.  Currently working at [employer] in [place].  She has a longer commute but enjoys the time in her car  No SI, no HI  No other aggravating or relieving factors.    No other c/o.  Past Medical History:  Diagnosis Date   Constipation    chronic   GERD (gastroesophageal reflux disease)    HLA B27 positive    IUD (intrauterine device) in place    Mirena   Migraines    Current Outpatient Medications on File Prior to Visit  Medication Sig Dispense Refill   botulinum toxin Type  A (BOTOX) 200 units injection Inject 200 Units into the muscle every 3 (three) months. Inject 155 units IM into multiple site in the face,neck and head once every 90 days 1 each 4   buPROPion (WELLBUTRIN XL) 300 MG 24 hr tablet Take 1 tablet (300 mg total) by mouth daily. 90 tablet 0   clonazePAM (KLONOPIN) 1 MG tablet Take 1/2 to 1 tablet by mouth at bedtime as needed for sleep 30 tablet 2   cyclobenzaprine (FLEXERIL) 10 MG tablet Take 1 tablet (10 mg total) by mouth 3 (three) times daily as needed for muscle spasms. 30 tablet 5   promethazine (PHENERGAN) 25 MG tablet Take 1 tablet (25 mg total) by mouth every 8 (eight) hours as needed. 30 tablet 5   rizatriptan (MAXALT) 10 MG tablet TAKE 1 TABLET AS NEEDED FOR MIGRAINE. MAY REPEAT IN 2 HOURS IF NEEDED. MAX 2 TABS/24HRS 10 tablet 5   scopolamine (TRANSDERM-SCOP, 1.5 MG,) 1 MG/3DAYS Place 1 patch (1.5 mg total) onto the skin every 3 (three) days. 4 patch 0   senna (SENOKOT) 8.6 MG TABS tablet Take 1 tablet by mouth.     Ubrogepant (UBRELVY) 100 MG TABS Take 1 tablet (100 mg total) by mouth as needed. May repeat after 2 hours.  Maximum 2 tablets  in 24 hours. 30 tablet 3   [DISCONTINUED] estradiol (ESTRACE) 0.5 MG tablet TAKE 1 TABLET BY MOUTH EVERY DAY 90 tablet 4   ibuprofen (ADVIL) 600 MG tablet Take 600 mg by mouth 3 (three) times daily.     Insulin Pen Needle (BD PEN NEEDLE NANO U/F) 32G X 4 MM MISC 1 each by Does not apply route at bedtime. 100 each 11   levonorgestrel (MIRENA) 20 MCG/24HR IUD 1 each by Intrauterine route once. June 2016     No current facility-administered medications on file prior to visit.     The following portions of the patient's history were reviewed and updated as appropriate: allergies, current medications, past family history, past medical history, past social history, past surgical history and problem list.  ROS Otherwise as in subjective above  Objective: BP 120/70   Pulse 81   Wt 148 lb 6.4 oz (67.3 kg)    BMI 26.29 kg/m   General appearance: alert, no distress, well developed, well nourished Psych: Pleasant, answers question appropriately     Assessment: Encounter Diagnoses  Name Primary?   Chronic constipation Yes   Chronic migraine without aura without status migrainosus, not intractable    Insomnia, unspecified type    Major depression in remission      Plan: Chronic constipation-does fine on current dose of Linzess  Chronic migraine-managed by neurology  Chronic insomnia-does well on clonazepam, currently managed with psychiatry  History depression-currently on Wellbutrin XL 300 mg for least a year and is tolerated this well.  She may want to end up transferring this part of her care to [place].  Currently she is still seeing psychiatry.  She has had some recent issues with refills getting done timely and had some recent issues with communication difficulties with the psychiatry office.  She will discuss with her visit with psychiatry today  Arlesha was seen today for refills on meds.  Diagnoses and all orders for this visit:  Chronic constipation  Chronic migraine without aura without status migrainosus, not intractable  Insomnia, unspecified type  Major depression in remission  Other orders -     linaclotide (LINZESS) 145 MCG CAPS capsule; TAKE 1 CAPSULE BY MOUTH EVERY DAY BEFORE BREAKFAST    Follow up: 84mo

## 2022-04-07 NOTE — Patient Instructions (Signed)
[facility] Cyril Loosen, counseling [address] [place], [place] [address] [phone]

## 2022-04-20 DIAGNOSIS — N6320 Unspecified lump in the left breast, unspecified quadrant: Secondary | ICD-10-CM | POA: Diagnosis not present

## 2022-04-23 ENCOUNTER — Encounter: Payer: Self-pay | Admitting: Internal Medicine

## 2022-04-23 DIAGNOSIS — N6002 Solitary cyst of left breast: Secondary | ICD-10-CM | POA: Diagnosis not present

## 2022-04-23 DIAGNOSIS — N6324 Unspecified lump in the left breast, lower inner quadrant: Secondary | ICD-10-CM | POA: Diagnosis not present

## 2022-04-23 DIAGNOSIS — R92333 Mammographic heterogeneous density, bilateral breasts: Secondary | ICD-10-CM | POA: Diagnosis not present

## 2022-04-23 DIAGNOSIS — N6012 Diffuse cystic mastopathy of left breast: Secondary | ICD-10-CM | POA: Diagnosis not present

## 2022-04-23 LAB — HM MAMMOGRAPHY

## 2022-05-05 DIAGNOSIS — Z419 Encounter for procedure for purposes other than remedying health state, unspecified: Secondary | ICD-10-CM | POA: Diagnosis not present

## 2022-06-02 ENCOUNTER — Other Ambulatory Visit (HOSPITAL_COMMUNITY): Payer: Self-pay

## 2022-06-03 ENCOUNTER — Telehealth: Payer: Self-pay | Admitting: Pharmacy Technician

## 2022-06-03 NOTE — Telephone Encounter (Signed)
Patient Advocate Encounter  Received notification from [facility] that prior authorization for BOTOX 200U is required.   PA submitted on 5.30.24 Key [id number] Status is pending

## 2022-06-05 DIAGNOSIS — Z419 Encounter for procedure for purposes other than remedying health state, unspecified: Secondary | ICD-10-CM | POA: Diagnosis not present
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
## 2022-06-09 ENCOUNTER — Ambulatory Visit (INDEPENDENT_AMBULATORY_CARE_PROVIDER_SITE_OTHER): Payer: BC Managed Care – PPO | Admitting: Neurology

## 2022-06-09 ENCOUNTER — Encounter: Payer: Self-pay | Admitting: Neurology

## 2022-06-09 VITALS — BP 120/72 | HR 82 | Ht 62.0 in | Wt 157.8 lb

## 2022-06-09 DIAGNOSIS — G43009 Migraine without aura, not intractable, without status migrainosus: Secondary | ICD-10-CM | POA: Diagnosis not present

## 2022-06-10 ENCOUNTER — Other Ambulatory Visit (HOSPITAL_COMMUNITY): Payer: Self-pay

## 2022-06-11 ENCOUNTER — Other Ambulatory Visit (HOSPITAL_COMMUNITY): Payer: Self-pay

## 2022-06-11 ENCOUNTER — Other Ambulatory Visit: Payer: Self-pay

## 2022-06-14 ENCOUNTER — Other Ambulatory Visit: Payer: Self-pay

## 2022-06-14 ENCOUNTER — Other Ambulatory Visit (HOSPITAL_COMMUNITY): Payer: Self-pay

## 2022-06-15 ENCOUNTER — Other Ambulatory Visit (HOSPITAL_COMMUNITY): Payer: Self-pay

## 2022-06-16 ENCOUNTER — Other Ambulatory Visit (HOSPITAL_COMMUNITY): Payer: Self-pay

## 2022-06-18 ENCOUNTER — Other Ambulatory Visit (HOSPITAL_COMMUNITY): Payer: Self-pay

## 2022-06-23 ENCOUNTER — Ambulatory Visit (INDEPENDENT_AMBULATORY_CARE_PROVIDER_SITE_OTHER): Payer: BC Managed Care – PPO | Admitting: Family Medicine

## 2022-06-23 ENCOUNTER — Encounter: Payer: Self-pay | Admitting: Family Medicine

## 2022-06-23 VITALS — BP 126/88 | HR 83 | Temp 97.9°F | Resp 14 | Wt 158.6 lb

## 2022-06-23 DIAGNOSIS — Z630 Problems in relationship with spouse or partner: Secondary | ICD-10-CM

## 2022-06-23 DIAGNOSIS — R0781 Pleurodynia: Secondary | ICD-10-CM

## 2022-06-23 NOTE — Patient Instructions (Signed)
You can take 2 Tylenol Extra Strength 3 times per day regularly for the next week and if you need extra then take 2 Aleve twice per day

## 2022-06-23 NOTE — Progress Notes (Signed)
   Subjective:    Patient ID: Mariah Ferrell, female    DOB: 1980/10/04, 42 y.o.   MRN: [id number]  HPI She complains of a 4-day history of right lateral lower rib pain.  No history of injury or overuse.  No fever, chills, cough, congestion, nausea or vomiting.  Pain is worse with coughing and with right lateral compression.  Taking a deep breath did not cause much trouble. She also is in the process of getting a divorce.  Apparently this was due to marital infidelity.  She has a court date and pending and after further discussion with her she seems to be handling this fairly well.  She also has a 20 year old daughter who is bipolar and has had some recent difficulties requiring hospitalization.   Review of Systems     Objective:   Physical Exam Alert and in no distress. Tympanic membranes and canals are normal. Pharyngeal area is normal. Neck is supple without adenopathy or thyromegaly. Cardiac exam shows a regular sinus rhythm without murmurs or gallops. Lungs are clear to auscultation.        Assessment & Plan:  Rib pain  Marital stress She is to take 2 extra strength Tylenol 3 times per day and add 2 Aleve twice per day if needed.  If she has continued difficulty, she is to leave a message on MyChart and I think a follow-up chest x-ray in a week or so would be appropriate. I then discussed the impending divorce and some milligrams occasions of that from the financial as well as psychological point of view.  She has gotten counseling and also has gotten up for her daughter.  Explained that these things usually do take much longer than needed and to be patient.

## 2022-06-25 ENCOUNTER — Ambulatory Visit (INDEPENDENT_AMBULATORY_CARE_PROVIDER_SITE_OTHER): Payer: BC Managed Care – PPO | Admitting: Neurology

## 2022-06-25 ENCOUNTER — Other Ambulatory Visit: Payer: Self-pay

## 2022-06-25 DIAGNOSIS — G43709 Chronic migraine without aura, not intractable, without status migrainosus: Secondary | ICD-10-CM | POA: Diagnosis not present

## 2022-06-25 NOTE — Progress Notes (Signed)
Medication Samples have been provided to the patient.  Drug name: Bernita Raisin       Strength: 100mg          Qty: 4  LOT: [id number]  Exp.Date: 12/26  Dosing instructions: as needed  The patient has been instructed regarding the correct time, dose, and frequency of taking this medication, including desired effects and most common side effects.   Leida Lauth 9:47 AM 06/25/2022

## 2022-06-25 NOTE — Progress Notes (Signed)
[facility]  ° °Procedure Note Botox ° °Attending: Dr. Mili Piltz ° °Preoperative Diagnosis(es): Chronic migraine ° °Consent obtained from: The patient °Benefits discussed included, but were not limited to decreased muscle tightness, increased joint range of motion, and decreased pain.  Risk discussed included, but were not limited pain and discomfort, bleeding, bruising, excessive weakness, venous thrombosis, muscle atrophy and dysphagia.  Anticipated outcomes of the procedure as well as he risks and benefits of the alternatives to the procedure, and the roles and tasks of the personnel to be involved, were discussed with the patient, and the patient consents to the procedure and agrees to proceed. A copy of the patient medication guide was given to the patient which explains the blackbox warning. ° °Patients identity and treatment sites confirmed Yes.  . ° °Details of Procedure: °Skin was cleaned with alcohol. Prior to injection, the needle plunger was aspirated to make sure the needle was not within a blood vessel.  There was no blood retrieved on aspiration.   ° °Following is a summary of the muscles injected  And the amount of Botulinum toxin used: ° °Dilution °200 units of Botox was reconstituted with 4 ml of preservative free normal saline. °Time of reconstitution: At the time of the office visit (<30 minutes prior to injection)  ° °Injections  °155 total units of Botox was injected with a 30 gauge needle. ° °Injection Sites: °L occipitalis: 15 units- 3 sites  °R occiptalis: 15 units- 3 sites ° °L upper trapezius: 15 units- 3 sites °R upper trapezius: 15 units- 3 sits          °L paraspinal: 10 units- 2 sites °R paraspinal: 10 units- 2 sites ° °Face °L frontalis(2 injection sites):10 units   °R frontalis(2 injection sites):10 units         °L corrugator: 5 units   °R corrugator: 5 units           °Procerus: 5 units   °L temporalis: 20 units °R temporalis: 20 units  ° °Agent:  °200 units of botulinum Type  A (Onobotulinum Toxin type A) was reconstituted with 4 ml of preservative free normal saline.  °Time of reconstitution: At the time of the office visit (<30 minutes prior to injection)  ° ° ° Total injected (Units):  155 ° Total wasted (Units):  45 ° °Patient tolerated procedure well without complications.   °Reinjection is anticipated in 3 months. ° ° °

## 2022-07-05 DIAGNOSIS — Z419 Encounter for procedure for purposes other than remedying health state, unspecified: Secondary | ICD-10-CM | POA: Diagnosis not present

## 2022-07-23 ENCOUNTER — Ambulatory Visit (INDEPENDENT_AMBULATORY_CARE_PROVIDER_SITE_OTHER): Payer: BC Managed Care – PPO | Admitting: Medical

## 2022-07-23 ENCOUNTER — Encounter: Payer: Self-pay | Admitting: Neurology

## 2022-07-23 ENCOUNTER — Telehealth: Payer: Self-pay

## 2022-07-23 ENCOUNTER — Telehealth: Payer: BC Managed Care – PPO | Admitting: Nurse Practitioner

## 2022-07-23 VITALS — BP 130/80 | HR 101 | Temp 97.4°F | Wt 164.6 lb

## 2022-07-23 DIAGNOSIS — H68001 Unspecified Eustachian salpingitis, right ear: Secondary | ICD-10-CM | POA: Diagnosis not present

## 2022-07-23 DIAGNOSIS — J3489 Other specified disorders of nose and nasal sinuses: Secondary | ICD-10-CM

## 2022-07-23 DIAGNOSIS — R519 Headache, unspecified: Secondary | ICD-10-CM

## 2022-07-23 DIAGNOSIS — G43809 Other migraine, not intractable, without status migrainosus: Secondary | ICD-10-CM

## 2022-07-23 MED ORDER — FLUCONAZOLE 150 MG PO TABS: ORAL_TABLET | ORAL | 0 refills | Status: DC

## 2022-07-23 MED ORDER — PROMETHAZINE HCL 25 MG PO TABS: 25.0000 mg | ORAL_TABLET | Freq: Three times a day (TID) | ORAL | 1 refills | Status: DC | PRN

## 2022-07-23 MED ORDER — HYDROCODONE-ACETAMINOPHEN 5-325 MG PO TABS: 1.0000 | ORAL_TABLET | Freq: Four times a day (QID) | ORAL | 0 refills | Status: DC | PRN

## 2022-07-23 MED ORDER — PREDNISONE 10 MG (21) PO TBPK: ORAL_TABLET | ORAL | 0 refills | Status: DC

## 2022-07-23 MED ORDER — AMOXICILLIN 875 MG PO TABS: 875.0000 mg | ORAL_TABLET | Freq: Two times a day (BID) | ORAL | 0 refills | Status: AC

## 2022-07-23 NOTE — Progress Notes (Signed)
Jurline,  Unfortunately headaches and migraines are not something that we currently manage through E-visits. You can choose to either reschedule as a Video Visit or visit an in person Urgent care, or reach out to your primary care.   All the best Viviano Simas      I feel your condition warrants further evaluation and I recommend that you be seen for a face to face visit.  Please contact your primary care physician practice to be seen. Many offices offer virtual options to be seen via video if you are not comfortable going in person to a medical facility at this time.  NOTE: You will NOT be charged for this eVisit.  If you do not have a PCP, [facility] offers a free physician referral service available at [phone]. Our trained staff has the experience, knowledge and resources to put you in touch with a physician who is right for you.    If you are having a true medical emergency please call 911.   Your e-visit answers were reviewed by a board certified advanced clinical practitioner to complete your personal care plan.  Thank you for using e-Visits.

## 2022-07-23 NOTE — Progress Notes (Signed)
Subjective:  Mariah Ferrell is a 42 y.o. female who presents for Chief Complaint  Patient presents with   headache    Headache since Sunday- eye pain, pressure, ear pain, been taking mucinex, woke up this morning and feels like someone is pushing her eyes out. Pain level was a 6/10 this morning but is now a 2/10     Here for a week of symptoms including headache, ear pain, like sinus pressure.  Not congested.   Eye pressure, but this morning right ear pain.   This pain is more behind eyes but different than her migraine.  No fever, no sore throat, no vomiting or diarrhea.  Has some nausea.  No body aches or chills.   No sick contacts.  No other aggravating or relieving factors.  In last few migraines, not often, maybe 2 migraines in last month or 2.  This headache is different.  Her headaches usually don't start in the morning wakening her up.  So no recent travel.   Using maxalt, phenergan, ubrelvy, advil.   Used some mucinex D helped some.  Took a family member's prednisone dose pack this week.   Is dealing with stress of divorce proceedings.  No other aggravating or relieving factors. No other complaint.  Past Medical History:  Diagnosis Date   Constipation    chronic   GERD (gastroesophageal reflux disease)    HLA B27 positive    IUD (intrauterine device) in place    Mirena   Migraines    Current Outpatient Medications on File Prior to Visit  Medication Sig Dispense Refill   botulinum toxin Type A (BOTOX) 200 units injection Inject 200 Units into the muscle every 3 (three) months. Inject 155 units IM into multiple site in the face,neck and head once every 90 days 1 each 4   buPROPion (WELLBUTRIN XL) 300 MG 24 hr tablet Take 1 tablet (300 mg total) by mouth daily. 90 tablet 0   clonazePAM (KLONOPIN) 1 MG tablet Take 1/2 to 1 tablet by mouth at bedtime as needed for sleep 30 tablet 2   estradiol (ESTRACE) 0.5 MG tablet Take 0.5 mg by mouth daily.     ibuprofen (ADVIL) 600 MG tablet  Take 600 mg by mouth 3 (three) times daily.     levonorgestrel (MIRENA) 20 MCG/24HR IUD 1 each by Intrauterine route once. June 2016     rizatriptan (MAXALT) 10 MG tablet TAKE 1 TABLET AS NEEDED FOR MIGRAINE. MAY REPEAT IN 2 HOURS IF NEEDED. MAX 2 TABS/24HRS 10 tablet 5   senna (SENOKOT) 8.6 MG TABS tablet Take 1 tablet by mouth.     Ubrogepant (UBRELVY) 100 MG TABS Take 1 tablet (100 mg total) by mouth as needed. May repeat after 2 hours.  Maximum 2 tablets in 24 hours. 30 tablet 3   cyclobenzaprine (FLEXERIL) 10 MG tablet Take 1 tablet (10 mg total) by mouth 3 (three) times daily as needed for muscle spasms. (Patient not taking: Reported on 07/23/2022) 30 tablet 5   Insulin Pen Needle (BD PEN NEEDLE NANO U/F) 32G X 4 MM MISC 1 each by Does not apply route at bedtime. 100 each 11   No current facility-administered medications on file prior to visit.     The following portions of the patient's history were reviewed and updated as appropriate: allergies, current medications, past family history, past medical history, past social history, past surgical history and problem list.  ROS Otherwise as in subjective above    Objective:  BP 130/80   Pulse (!) 101   Temp (!) 97.4 F (36.3 C)   Wt 164 lb 9.6 oz (74.7 kg)   BMI 30.11 kg/m   General appearance: alert, no distress, well developed, well nourished HEENT: normocephalic, sclerae anicteric, conjunctiva pink and moist, TMs pearly, nares patent, no discharge or erythema, pharynx normal Oral cavity: MMM, no lesions Neck: supple, no lymphadenopathy, no thyromegaly, no masses Heart: RRR, normal S1, S2, no murmurs Lungs: CTA bilaterally, no wheezes, rhonchi, or rales MSK: mild tendnerss upper back paraspinal on right Neuro: cn2-12 intact, nonfocal exam Pulses: 2+ radial pulses, 2+ pedal pulses, normal cap refill Ext: no edema   Assessment: Encounter Diagnoses  Name Primary?   Salpingitis of right eustachian tube Yes   Sinus pressure     Acute intractable headache, unspecified headache type      Plan: Discussed symptoms and concerns and possible differential, migraine, tension headache, sinusitis, eustachian tube dysfunction, other, need for better hydration.  Use mucinex D a few more days along with increased water intake.   Can begin trial of Amoxicillin.  Rest over weekend.   Norco prescribed for breakthrough pain.    Diflucan sent in event of yeast vaginitis from antibiotic use.  Refilled phenergan for migraines and nausea.  If worse or not improving within 48-72 hours then call or get re-evaluated.   Mariah Ferrell was seen today for headache.  Diagnoses and all orders for this visit:  Salpingitis of right eustachian tube  Sinus pressure  Acute intractable headache, unspecified headache type  Other orders -     amoxicillin (AMOXIL) 875 MG tablet; Take 1 tablet (875 mg total) by mouth 2 (two) times daily for 10 days. -     fluconazole (DIFLUCAN) 150 MG tablet; Once, can repeat in 1 week -     HYDROcodone-acetaminophen (NORCO) 5-325 MG tablet; Take 1 tablet by mouth every 6 (six) hours as needed. -     promethazine (PHENERGAN) 25 MG tablet; Take 1 tablet (25 mg total) by mouth every 8 (eight) hours as needed.    Follow up: prn

## 2022-07-25 ENCOUNTER — Other Ambulatory Visit (HOSPITAL_COMMUNITY): Payer: Self-pay

## 2022-07-26 ENCOUNTER — Ambulatory Visit (INDEPENDENT_AMBULATORY_CARE_PROVIDER_SITE_OTHER): Payer: BC Managed Care – PPO

## 2022-07-26 DIAGNOSIS — G43709 Chronic migraine without aura, not intractable, without status migrainosus: Secondary | ICD-10-CM | POA: Diagnosis not present

## 2022-07-26 MED ORDER — KETOROLAC TROMETHAMINE 60 MG/2ML IM SOLN
60.0000 mg | Freq: Once | INTRAMUSCULAR | Status: AC
  Administered 2022-07-26: 60 mg via INTRAMUSCULAR

## 2022-07-26 MED ORDER — DIPHENHYDRAMINE HCL 50 MG/ML IJ SOLN
50.0000 mg | Freq: Once | INTRAMUSCULAR | Status: AC
Start: 2022-07-26 — End: 2022-07-26
  Administered 2022-07-26: 25 mg via INTRAMUSCULAR

## 2022-07-26 MED ORDER — METOCLOPRAMIDE HCL 5 MG/ML IJ SOLN
10.0000 mg | Freq: Once | INTRAMUSCULAR | Status: AC
  Administered 2022-07-26: 10 mg via INTRAMUSCULAR

## 2022-07-30 DIAGNOSIS — F4321 Adjustment disorder with depressed mood: Secondary | ICD-10-CM | POA: Diagnosis not present

## 2022-07-30 DIAGNOSIS — F4310 Post-traumatic stress disorder, unspecified: Secondary | ICD-10-CM | POA: Diagnosis not present

## 2022-07-30 DIAGNOSIS — F331 Major depressive disorder, recurrent, moderate: Secondary | ICD-10-CM | POA: Diagnosis not present

## 2022-08-05 DIAGNOSIS — Z419 Encounter for procedure for purposes other than remedying health state, unspecified: Secondary | ICD-10-CM | POA: Diagnosis not present

## 2022-09-05 DIAGNOSIS — Z419 Encounter for procedure for purposes other than remedying health state, unspecified: Secondary | ICD-10-CM | POA: Diagnosis not present

## 2022-09-08 ENCOUNTER — Other Ambulatory Visit (HOSPITAL_COMMUNITY): Payer: Self-pay

## 2022-09-08 ENCOUNTER — Other Ambulatory Visit: Payer: Self-pay

## 2022-09-08 MED ORDER — ONABOTULINUMTOXINA 200 UNITS IJ SOLR
200.0000 [IU] | INTRAMUSCULAR | 4 refills | Status: DC
  Filled 2022-09-08: qty 1, 90d supply, fill #0
  Filled 2022-12-07: qty 1, 90d supply, fill #1
  Filled 2023-03-02: qty 1, 90d supply, fill #2
  Filled 2023-06-13 (×2): qty 1, 90d supply, fill #3

## 2022-09-09 ENCOUNTER — Other Ambulatory Visit (HOSPITAL_COMMUNITY): Payer: Self-pay

## 2022-09-14 ENCOUNTER — Other Ambulatory Visit (HOSPITAL_COMMUNITY): Payer: Self-pay

## 2022-09-14 ENCOUNTER — Other Ambulatory Visit: Payer: Self-pay

## 2022-09-24 ENCOUNTER — Ambulatory Visit (INDEPENDENT_AMBULATORY_CARE_PROVIDER_SITE_OTHER): Payer: BC Managed Care – PPO | Admitting: Neurology

## 2022-09-24 ENCOUNTER — Ambulatory Visit: Payer: BC Managed Care – PPO | Admitting: Neurology

## 2022-09-24 DIAGNOSIS — G43709 Chronic migraine without aura, not intractable, without status migrainosus: Secondary | ICD-10-CM | POA: Diagnosis not present

## 2022-09-24 MED ORDER — ONABOTULINUMTOXINA 100 UNITS IJ SOLR
200.0000 [IU] | Freq: Once | INTRAMUSCULAR | Status: AC
Start: 2022-09-24 — End: ?

## 2022-09-24 NOTE — Progress Notes (Signed)

## 2022-10-04 DIAGNOSIS — F4321 Adjustment disorder with depressed mood: Secondary | ICD-10-CM | POA: Diagnosis not present

## 2022-10-04 DIAGNOSIS — F331 Major depressive disorder, recurrent, moderate: Secondary | ICD-10-CM | POA: Diagnosis not present

## 2022-10-04 DIAGNOSIS — F4310 Post-traumatic stress disorder, unspecified: Secondary | ICD-10-CM | POA: Diagnosis not present

## 2022-10-05 DIAGNOSIS — Z419 Encounter for procedure for purposes other than remedying health state, unspecified: Secondary | ICD-10-CM | POA: Diagnosis not present

## 2022-10-14 DIAGNOSIS — Z30431 Encounter for routine checking of intrauterine contraceptive device: Secondary | ICD-10-CM | POA: Diagnosis not present

## 2022-10-14 DIAGNOSIS — R102 Pelvic and perineal pain: Secondary | ICD-10-CM | POA: Diagnosis not present

## 2022-10-19 ENCOUNTER — Encounter: Payer: Self-pay | Admitting: Internal Medicine

## 2022-10-29 ENCOUNTER — Other Ambulatory Visit: Payer: Self-pay

## 2022-10-29 ENCOUNTER — Telehealth: Payer: Self-pay | Admitting: Neurology

## 2022-10-29 ENCOUNTER — Encounter: Payer: Self-pay | Admitting: Neurology

## 2022-10-29 DIAGNOSIS — G43709 Chronic migraine without aura, not intractable, without status migrainosus: Secondary | ICD-10-CM

## 2022-10-29 DIAGNOSIS — G43009 Migraine without aura, not intractable, without status migrainosus: Secondary | ICD-10-CM

## 2022-10-29 MED ORDER — UBRELVY 100 MG PO TABS
1.0000 | ORAL_TABLET | ORAL | 3 refills | Status: DC | PRN
Start: 2022-10-29 — End: 2022-12-01

## 2022-10-29 NOTE — Telephone Encounter (Signed)
1. Which medications need refilled? (List name and dosage, if known) Mariah Ferrell - pt says she is out of refills and might need a PA. She says she has been getting the medication before, but she's not 100% sure, but needs the medication for this weekend  2. Which pharmacy/location is medication to be sent to? (include street and city if local pharmacy) [facility] [address]

## 2022-10-29 NOTE — Telephone Encounter (Signed)
Sent in RX and called patient left voicemail message

## 2022-11-03 ENCOUNTER — Telehealth: Payer: Self-pay

## 2022-11-03 ENCOUNTER — Other Ambulatory Visit (HOSPITAL_COMMUNITY): Payer: Self-pay

## 2022-11-03 NOTE — Telephone Encounter (Signed)
PA request has been Submitted. New Encounter created for follow up. For additional info see Pharmacy Prior Auth telephone encounter from 10/30.

## 2022-11-03 NOTE — Telephone Encounter (Signed)
*  [facility]  Pharmacy Patient Advocate Encounter  Received notification from [facility] that Prior Authorization for Ubrelvy 100MG  tablets  has been APPROVED from 11/03/2022 to 11/03/2023   PA #/Case ID/Reference #: [id number]  *request from patients pharmacy shows other coverage than card on file

## 2022-11-05 DIAGNOSIS — Z419 Encounter for procedure for purposes other than remedying health state, unspecified: Secondary | ICD-10-CM | POA: Diagnosis not present

## 2022-11-30 ENCOUNTER — Other Ambulatory Visit: Payer: Self-pay | Admitting: Neurology

## 2022-11-30 ENCOUNTER — Other Ambulatory Visit: Payer: Self-pay | Admitting: Medical

## 2022-11-30 DIAGNOSIS — G43709 Chronic migraine without aura, not intractable, without status migrainosus: Secondary | ICD-10-CM

## 2022-11-30 DIAGNOSIS — G43009 Migraine without aura, not intractable, without status migrainosus: Secondary | ICD-10-CM

## 2022-11-30 NOTE — Telephone Encounter (Signed)
Pt was put on linzess

## 2022-12-05 DIAGNOSIS — Z419 Encounter for procedure for purposes other than remedying health state, unspecified: Secondary | ICD-10-CM | POA: Diagnosis not present

## 2022-12-07 ENCOUNTER — Other Ambulatory Visit: Payer: Self-pay

## 2022-12-07 NOTE — Progress Notes (Signed)
Specialty Pharmacy Refill Coordination Note  Lyrah Mcpeek Deheer is a 42 y.o. female contacted today regarding refills of specialty medication(s) -- (Botox)   Patient requested Courier to Provider Office   Delivery date: 12/20/22   Verified address: [address] [place] [address]   Medication will be filled on 12/17/22.

## 2022-12-16 ENCOUNTER — Other Ambulatory Visit (HOSPITAL_COMMUNITY): Payer: Self-pay

## 2022-12-16 NOTE — Telephone Encounter (Signed)
Pharmacy Patient Advocate Encounter  Received notification from [facility] that Prior Authorization for BOTOX 200 has been APPROVED from 4.30.24 to 5.30.25   PA #/Case ID/Reference #: [id number]

## 2022-12-17 ENCOUNTER — Other Ambulatory Visit: Payer: Self-pay

## 2022-12-17 ENCOUNTER — Other Ambulatory Visit (HOSPITAL_COMMUNITY): Payer: Self-pay

## 2022-12-20 ENCOUNTER — Other Ambulatory Visit (HOSPITAL_COMMUNITY): Payer: Self-pay

## 2022-12-21 ENCOUNTER — Other Ambulatory Visit (HOSPITAL_COMMUNITY): Payer: Self-pay

## 2022-12-21 ENCOUNTER — Telehealth: Payer: Self-pay | Admitting: Pharmacy Technician

## 2022-12-21 ENCOUNTER — Telehealth: Payer: Self-pay

## 2022-12-21 NOTE — Telephone Encounter (Signed)
LMOVM for patient to call back.

## 2022-12-21 NOTE — Telephone Encounter (Signed)
Pharmacy Patient Advocate Encounter  Appeal has been submitted Expedited via fax [phone] with supporting documents.   Received notification from [facility] [employer] that Prior Authorization for BOTOX 200 has been DENIED.  Full denial letter will be uploaded to the media tab. See denial reason below.    PA #/Case ID/Reference #: [id number]  ------------------------   Received notification from Patient Pharmacy that prior authorization for BOTOX 200 is required/requested.   Insurance verification completed.   The patient is insured through [facility]  [place] .   Per test claim: PA required; PA submitted to above mentioned insurance via Phone Key/confirmation #/EOC [phone] Status is pending  Call reference #[id number]

## 2022-12-22 ENCOUNTER — Other Ambulatory Visit (HOSPITAL_COMMUNITY): Payer: Self-pay

## 2022-12-22 ENCOUNTER — Other Ambulatory Visit: Payer: Self-pay

## 2022-12-22 ENCOUNTER — Telehealth: Payer: Self-pay

## 2022-12-22 NOTE — Telephone Encounter (Signed)
Pharmacy Patient Advocate Encounter  Received notification from [facility] Medicaid that Prior Authorization for  has been APPROVED from 12.17.24 to 12.18.25. Ran test claim, Copay is $4. This test claim was processed through [facility]- copay amounts may vary at other pharmacies due to pharmacy/plan contracts, or as the patient moves through the different stages of their insurance plan.   PA #/Case ID/Reference #: [id number]

## 2022-12-22 NOTE — Telephone Encounter (Signed)
Patient wants to know what she cn take other then Botox if the insurance will not approve it for her appt on Botox.

## 2022-12-24 ENCOUNTER — Other Ambulatory Visit: Payer: Self-pay | Admitting: Neurology

## 2022-12-24 ENCOUNTER — Ambulatory Visit (INDEPENDENT_AMBULATORY_CARE_PROVIDER_SITE_OTHER): Payer: BC Managed Care – PPO | Admitting: Neurology

## 2022-12-24 DIAGNOSIS — G43709 Chronic migraine without aura, not intractable, without status migrainosus: Secondary | ICD-10-CM

## 2022-12-24 DIAGNOSIS — G43009 Migraine without aura, not intractable, without status migrainosus: Secondary | ICD-10-CM

## 2022-12-24 MED ORDER — ONABOTULINUMTOXINA 100 UNITS IJ SOLR
200.0000 [IU] | Freq: Once | INTRAMUSCULAR | Status: AC
Start: 2022-12-24 — End: 2022-12-24
  Administered 2022-12-24: 155 [IU] via INTRAMUSCULAR

## 2022-12-24 MED ORDER — UBRELVY 100 MG PO TABS
100.0000 mg | ORAL_TABLET | ORAL | 5 refills | Status: DC | PRN
Start: 2022-12-24 — End: 2022-12-24

## 2022-12-24 MED ORDER — UBRELVY 100 MG PO TABS
100.0000 mg | ORAL_TABLET | ORAL | 5 refills | Status: DC | PRN
Start: 2022-12-24 — End: 2022-12-27

## 2022-12-24 NOTE — Progress Notes (Signed)

## 2022-12-27 ENCOUNTER — Other Ambulatory Visit: Payer: Self-pay | Admitting: Neurology

## 2022-12-27 DIAGNOSIS — G43709 Chronic migraine without aura, not intractable, without status migrainosus: Secondary | ICD-10-CM

## 2022-12-27 DIAGNOSIS — G43009 Migraine without aura, not intractable, without status migrainosus: Secondary | ICD-10-CM

## 2022-12-27 DIAGNOSIS — Z01419 Encounter for gynecological examination (general) (routine) without abnormal findings: Secondary | ICD-10-CM | POA: Diagnosis not present

## 2022-12-27 DIAGNOSIS — R87612 Low grade squamous intraepithelial lesion on cytologic smear of cervix (LGSIL): Secondary | ICD-10-CM | POA: Diagnosis not present

## 2022-12-27 DIAGNOSIS — Z01411 Encounter for gynecological examination (general) (routine) with abnormal findings: Secondary | ICD-10-CM | POA: Diagnosis not present

## 2022-12-27 DIAGNOSIS — Z130 Encounter for screening for diseases of the blood and blood-forming organs and certain disorders involving the immune mechanism: Secondary | ICD-10-CM | POA: Diagnosis not present

## 2022-12-27 DIAGNOSIS — N951 Menopausal and female climacteric states: Secondary | ICD-10-CM | POA: Diagnosis not present

## 2022-12-27 MED ORDER — UBRELVY 100 MG PO TABS
100.0000 mg | ORAL_TABLET | ORAL | 5 refills | Status: DC | PRN
Start: 2022-12-27 — End: 2023-06-15

## 2022-12-28 DIAGNOSIS — F331 Major depressive disorder, recurrent, moderate: Secondary | ICD-10-CM | POA: Diagnosis not present

## 2022-12-28 DIAGNOSIS — F4321 Adjustment disorder with depressed mood: Secondary | ICD-10-CM | POA: Diagnosis not present

## 2022-12-28 DIAGNOSIS — F4310 Post-traumatic stress disorder, unspecified: Secondary | ICD-10-CM | POA: Diagnosis not present

## 2023-01-05 DIAGNOSIS — Z419 Encounter for procedure for purposes other than remedying health state, unspecified: Secondary | ICD-10-CM | POA: Diagnosis not present

## 2023-02-03 ENCOUNTER — Telehealth: Payer: Self-pay

## 2023-02-03 NOTE — Telephone Encounter (Signed)
PA needed for Ubrelvy.

## 2023-02-05 DIAGNOSIS — Z419 Encounter for procedure for purposes other than remedying health state, unspecified: Secondary | ICD-10-CM | POA: Diagnosis not present

## 2023-02-08 NOTE — Telephone Encounter (Signed)
Taper sent

## 2023-02-25 ENCOUNTER — Other Ambulatory Visit (HOSPITAL_COMMUNITY): Payer: Self-pay

## 2023-02-25 ENCOUNTER — Telehealth: Payer: Self-pay

## 2023-02-25 NOTE — Telephone Encounter (Signed)
 Pharmacy Patient Advocate Encounter   Received notification from Pt Calls Messages that prior authorization for Ubrelvy 100MG  tablets is required/requested.   Insurance verification completed.   The patient is insured through [employer] .   Prior Authorization for Bernita Raisin 100MG  tablets has been APPROVED from 02-25-2023 to 02-24-2024   PA #/Case ID/Reference #: [id number]

## 2023-02-25 NOTE — Telephone Encounter (Signed)
 PA request has been Approved. New Encounter created for follow up. For additional info see Pharmacy Prior Auth telephone encounter from 02-25-2023.

## 2023-03-02 ENCOUNTER — Other Ambulatory Visit: Payer: Self-pay

## 2023-03-02 NOTE — Progress Notes (Signed)
 Specialty Pharmacy Refill Coordination Note  Mariah Ferrell is a 43 y.o. female contacted today regarding refills of specialty medication(s) OnabotulinumtoxinA (BOTOX)   Patient requested Courier to Provider Office   Delivery date: 03/14/23   Verified address: [address] [place] [address]   Medication will be filled on 03/11/23. Appt scheduled for 03/25/23

## 2023-03-03 ENCOUNTER — Other Ambulatory Visit: Payer: Self-pay

## 2023-03-05 DIAGNOSIS — Z419 Encounter for procedure for purposes other than remedying health state, unspecified: Secondary | ICD-10-CM | POA: Diagnosis not present

## 2023-03-07 ENCOUNTER — Telehealth: Payer: Self-pay | Admitting: Neurology

## 2023-03-07 NOTE — Telephone Encounter (Signed)
 Pt. Called to reshed and fit her in in a spot for Botox on a non Botox day due to her work, says this was done before. xplnd dates avail and she said Dr. Everlena Cooper will work her in as she declined a reshed for options avail,would like a call back

## 2023-03-07 NOTE — Telephone Encounter (Signed)
 LMOVM unfortunately we can not do the Botox before 3/21 that is 91 days from her last injection and we do not have any other space besides the next botox day.

## 2023-03-10 ENCOUNTER — Other Ambulatory Visit: Payer: Self-pay

## 2023-03-10 ENCOUNTER — Encounter: Payer: Self-pay | Admitting: Neurology

## 2023-03-11 ENCOUNTER — Other Ambulatory Visit: Payer: Self-pay

## 2023-03-25 ENCOUNTER — Ambulatory Visit (INDEPENDENT_AMBULATORY_CARE_PROVIDER_SITE_OTHER): Payer: BC Managed Care – PPO | Admitting: Neurology

## 2023-03-25 ENCOUNTER — Other Ambulatory Visit: Payer: Self-pay

## 2023-03-25 DIAGNOSIS — G43709 Chronic migraine without aura, not intractable, without status migrainosus: Secondary | ICD-10-CM | POA: Diagnosis not present

## 2023-03-25 MED ORDER — ONABOTULINUMTOXINA 200 UNITS IJ SOLR
200.0000 [IU] | Freq: Once | INTRAMUSCULAR | Status: AC
  Administered 2023-03-25: 155 [IU] via INTRAMUSCULAR

## 2023-03-25 NOTE — Progress Notes (Signed)

## 2023-03-28 ENCOUNTER — Other Ambulatory Visit: Payer: Self-pay

## 2023-03-28 MED ORDER — RIZATRIPTAN BENZOATE 10 MG PO TABS: ORAL_TABLET | ORAL | 3 refills | Status: AC

## 2023-03-29 ENCOUNTER — Encounter: Payer: Self-pay | Admitting: Neurology

## 2023-04-11 DIAGNOSIS — F331 Major depressive disorder, recurrent, moderate: Secondary | ICD-10-CM | POA: Diagnosis not present

## 2023-04-11 DIAGNOSIS — F4310 Post-traumatic stress disorder, unspecified: Secondary | ICD-10-CM | POA: Diagnosis not present

## 2023-04-11 DIAGNOSIS — F4321 Adjustment disorder with depressed mood: Secondary | ICD-10-CM | POA: Diagnosis not present

## 2023-04-16 DIAGNOSIS — Z419 Encounter for procedure for purposes other than remedying health state, unspecified: Secondary | ICD-10-CM | POA: Diagnosis not present

## 2023-05-16 DIAGNOSIS — Z419 Encounter for procedure for purposes other than remedying health state, unspecified: Secondary | ICD-10-CM | POA: Diagnosis not present

## 2023-05-24 ENCOUNTER — Telehealth: Payer: Self-pay | Admitting: Neurology

## 2023-05-24 ENCOUNTER — Other Ambulatory Visit: Payer: Self-pay | Admitting: Medical

## 2023-05-24 NOTE — Telephone Encounter (Signed)
 Follow is scheduled for 6/6 and rescheduled for 6/11. Botox  is scheduled for 6/27.

## 2023-05-24 NOTE — Telephone Encounter (Signed)
 Pt. Wants to know if after 6/6 they can do botox  on 6/13

## 2023-06-10 ENCOUNTER — Ambulatory Visit: Payer: BC Managed Care – PPO | Admitting: Neurology

## 2023-06-13 ENCOUNTER — Telehealth: Payer: Self-pay

## 2023-06-13 ENCOUNTER — Telehealth: Payer: Self-pay | Admitting: Pharmacy Technician

## 2023-06-13 ENCOUNTER — Other Ambulatory Visit: Payer: Self-pay

## 2023-06-13 ENCOUNTER — Other Ambulatory Visit (HOSPITAL_COMMUNITY): Payer: Self-pay

## 2023-06-13 NOTE — Telephone Encounter (Signed)
 Appt 6/27, PA needed for Botox 

## 2023-06-13 NOTE — Telephone Encounter (Signed)
 Pharmacy Patient Advocate Encounter  Medical Benefit PA has been submitted for Botox - [phone] via Fax.  INSURANCE: [employer] KEY/EOC/FAX: [id number] Procedure code 21308  Pending BotoxOne report require a PA Status is Pending  Pharmacy Patient Advocate Encounter   Botox  One Portal verification has been Submitted Benefit Verification #:  [id number]   Primary Insurance: [employer] Dx Code: G43.009 J-code: Botox - M5784 Procedure code: [phone]

## 2023-06-13 NOTE — Telephone Encounter (Signed)
 Pt has primary insurance [facility]), other than medicaid.  PA has been submitted, and telephone encounter has been created. Please see telephone encounter dated 6.9.25.

## 2023-06-14 ENCOUNTER — Other Ambulatory Visit: Payer: Self-pay
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
## 2023-06-15 ENCOUNTER — Encounter: Payer: Self-pay | Admitting: Neurology

## 2023-06-15 ENCOUNTER — Other Ambulatory Visit: Payer: Self-pay

## 2023-06-15 ENCOUNTER — Telehealth: Admitting: Neurology

## 2023-06-15 ENCOUNTER — Other Ambulatory Visit (HOSPITAL_COMMUNITY): Payer: Self-pay

## 2023-06-15 DIAGNOSIS — G43709 Chronic migraine without aura, not intractable, without status migrainosus: Secondary | ICD-10-CM

## 2023-06-15 DIAGNOSIS — M545 Low back pain, unspecified: Secondary | ICD-10-CM

## 2023-06-15 DIAGNOSIS — G43009 Migraine without aura, not intractable, without status migrainosus: Secondary | ICD-10-CM

## 2023-06-15 MED ORDER — UBRELVY 100 MG PO TABS: 100.0000 mg | ORAL_TABLET | ORAL | 11 refills | Status: DC | PRN

## 2023-06-15 MED ORDER — PROMETHAZINE HCL 25 MG PO TABS: 25.0000 mg | ORAL_TABLET | Freq: Three times a day (TID) | ORAL | 1 refills | Status: DC | PRN

## 2023-06-15 MED ORDER — CYCLOBENZAPRINE HCL 10 MG PO TABS
10.0000 mg | ORAL_TABLET | Freq: Three times a day (TID) | ORAL | 5 refills | Status: AC | PRN
Start: 2023-06-15 — End: ?

## 2023-06-15 NOTE — Progress Notes (Signed)
 Specialty Pharmacy Refill Coordination Note  Spoke with Mariah Ferrell (Self).   Mariah Ferrell is a 43 y.o. female contacted today regarding refills of specialty medication(s) OnabotulinumtoxinA  (BOTOX )   Patient requested Courier to Provider Office   Delivery date: 06/23/23   Verified address: [facility] [address] [place],  [place] [address]   Medication will be filled on 06/22/23.

## 2023-06-16 DIAGNOSIS — Z419 Encounter for procedure for purposes other than remedying health state, unspecified: Secondary | ICD-10-CM | POA: Diagnosis not present

## 2023-06-17 DIAGNOSIS — N6325 Unspecified lump in the left breast, overlapping quadrants: Secondary | ICD-10-CM | POA: Diagnosis not present

## 2023-06-17 DIAGNOSIS — N6320 Unspecified lump in the left breast, unspecified quadrant: Secondary | ICD-10-CM | POA: Diagnosis not present

## 2023-06-17 DIAGNOSIS — R92323 Mammographic fibroglandular density, bilateral breasts: Secondary | ICD-10-CM | POA: Diagnosis not present

## 2023-06-20 ENCOUNTER — Other Ambulatory Visit: Payer: Self-pay

## 2023-06-23 ENCOUNTER — Ambulatory Visit: Admitting: Neurology

## 2023-07-01 ENCOUNTER — Ambulatory Visit (INDEPENDENT_AMBULATORY_CARE_PROVIDER_SITE_OTHER): Admitting: Neurology

## 2023-07-01 DIAGNOSIS — G43709 Chronic migraine without aura, not intractable, without status migrainosus: Secondary | ICD-10-CM | POA: Diagnosis not present

## 2023-07-01 MED ORDER — ONABOTULINUMTOXINA 100 UNITS IJ SOLR
200.0000 [IU] | Freq: Once | INTRAMUSCULAR | Status: AC
Start: 2023-07-01 — End: 2023-07-01
  Administered 2023-07-01: 155 [IU] via INTRAMUSCULAR

## 2023-07-01 NOTE — Progress Notes (Signed)

## 2023-07-04 ENCOUNTER — Encounter: Payer: Self-pay | Admitting: Neurology

## 2023-07-04 DIAGNOSIS — G4482 Headache associated with sexual activity: Secondary | ICD-10-CM

## 2023-07-04 DIAGNOSIS — F331 Major depressive disorder, recurrent, moderate: Secondary | ICD-10-CM | POA: Diagnosis not present

## 2023-07-04 DIAGNOSIS — F4321 Adjustment disorder with depressed mood: Secondary | ICD-10-CM | POA: Diagnosis not present

## 2023-07-04 DIAGNOSIS — F4310 Post-traumatic stress disorder, unspecified: Secondary | ICD-10-CM | POA: Diagnosis not present

## 2023-07-11 MED ORDER — PREDNISONE 10 MG (21) PO TBPK: ORAL_TABLET | ORAL | 0 refills | Status: DC

## 2023-07-16 DIAGNOSIS — Z419 Encounter for procedure for purposes other than remedying health state, unspecified: Secondary | ICD-10-CM | POA: Diagnosis not present

## 2023-07-26 ENCOUNTER — Other Ambulatory Visit (HOSPITAL_COMMUNITY): Payer: Self-pay

## 2023-07-28 DIAGNOSIS — F4310 Post-traumatic stress disorder, unspecified: Secondary | ICD-10-CM | POA: Diagnosis not present

## 2023-07-28 DIAGNOSIS — F331 Major depressive disorder, recurrent, moderate: Secondary | ICD-10-CM | POA: Diagnosis not present

## 2023-07-28 DIAGNOSIS — F4321 Adjustment disorder with depressed mood: Secondary | ICD-10-CM | POA: Diagnosis not present

## 2023-07-29 ENCOUNTER — Other Ambulatory Visit (HOSPITAL_COMMUNITY): Payer: Self-pay

## 2023-08-01 ENCOUNTER — Telehealth: Payer: Self-pay | Admitting: Internal Medicine

## 2023-08-01 NOTE — Telephone Encounter (Unsigned)
 Copied from CRM [id number]. Topic: Clinical - Request for Lab/Test Order >> Aug 01, 2023  3:25 PM Selinda RAMAN wrote: Reason for CRM: The patient has a physical scheduled next week and knows her provider will want to do blood work. She is asking if orders can be put in the system so she can get the labs done that morning because she says she cannot fast for 6 hours. Please assist patient further as she just needs to know what time she can come in to get the blood work done.

## 2023-08-02 ENCOUNTER — Other Ambulatory Visit: Payer: Self-pay | Admitting: Medical

## 2023-08-02 DIAGNOSIS — Z0000 Encounter for general adult medical examination without abnormal findings: Secondary | ICD-10-CM

## 2023-08-02 DIAGNOSIS — Z13220 Encounter for screening for lipoid disorders: Secondary | ICD-10-CM

## 2023-08-02 DIAGNOSIS — Z1389 Encounter for screening for other disorder: Secondary | ICD-10-CM

## 2023-08-02 DIAGNOSIS — Z131 Encounter for screening for diabetes mellitus: Secondary | ICD-10-CM

## 2023-08-02 NOTE — Telephone Encounter (Signed)
 See recent message  Lab orders placed.

## 2023-08-05 ENCOUNTER — Other Ambulatory Visit: Payer: Self-pay | Admitting: Medical

## 2023-08-08 ENCOUNTER — Other Ambulatory Visit (HOSPITAL_COMMUNITY): Payer: Self-pay

## 2023-08-09 ENCOUNTER — Encounter: Payer: Self-pay | Admitting: Medical

## 2023-08-09 ENCOUNTER — Encounter: Payer: Self-pay | Admitting: Internal Medicine

## 2023-08-09 ENCOUNTER — Ambulatory Visit: Admitting: Medical

## 2023-08-09 VITALS — BP 118/70 | HR 78 | Wt 184.4 lb

## 2023-08-09 DIAGNOSIS — Z131 Encounter for screening for diabetes mellitus: Secondary | ICD-10-CM | POA: Diagnosis not present

## 2023-08-09 DIAGNOSIS — Z0000 Encounter for general adult medical examination without abnormal findings: Secondary | ICD-10-CM

## 2023-08-09 DIAGNOSIS — Z136 Encounter for screening for cardiovascular disorders: Secondary | ICD-10-CM | POA: Diagnosis not present

## 2023-08-09 DIAGNOSIS — R5383 Other fatigue: Secondary | ICD-10-CM | POA: Diagnosis not present

## 2023-08-09 DIAGNOSIS — Z800 Family history of malignant neoplasm of digestive organs: Secondary | ICD-10-CM

## 2023-08-09 DIAGNOSIS — Z1389 Encounter for screening for other disorder: Secondary | ICD-10-CM

## 2023-08-09 DIAGNOSIS — Z13220 Encounter for screening for lipoid disorders: Secondary | ICD-10-CM | POA: Diagnosis not present

## 2023-08-09 DIAGNOSIS — K5909 Other constipation: Secondary | ICD-10-CM

## 2023-08-09 LAB — LIPID PANEL

## 2023-08-09 NOTE — Progress Notes (Signed)
 Subjective:   HPI  Mariah Ferrell is a 43 y.o. female who presents for Chief Complaint  Patient presents with   Annual Exam    Physical, had labs done this morning at [facility]. Needs refills on medication     Patient Care Team: Rick Warnick, Alm GORMAN RIGGERS as PCP - General (Family Medicine) Skeet Juliene SAUNDERS, DO as Consulting Physician (Neurology) Sees dentist Sees eye doctor Dr. Elane Sous, [facility] for psychiatry  Concerns: No particular issues.  She came in for fasting labs earlier today at the lab  Sees psychiatry  She has family history of colon cancer and is asking about colon cancer screening for herself  Reviewed their medical, surgical, family, social, medication, and allergy history and updated chart as appropriate.  Past Medical History:  Diagnosis Date   Anxiety and depression    Constipation    chronic   GERD (gastroesophageal reflux disease)    HLA B27 positive    IUD (intrauterine device) in place    Mirena    Migraines     Family History  Problem Relation Age of Onset   Hypertension Mother    Diabetes Mother        prediabetes   Arthritis Mother    Benign prostatic hyperplasia Father    Depression Sister    Polycystic ovary syndrome Sister    Diabetes Sister    Healthy Daughter    Cancer Maternal Uncle        colon   Pulmonary disease Maternal Grandmother    Alzheimer's disease Paternal Grandmother    Diabetes Paternal Grandfather    Breast cancer Cousin    Cancer Cousin        breast   Heart disease Neg Hx      Current Outpatient Medications:    botulinum toxin Type A  (BOTOX ) 200 units injection, Inject 200 Units into the muscle every 3 (three) months. Inject 155 units IM into multiple site in the face,neck and head once every 90 days, Disp: 1 each, Rfl: 4   buPROPion  (WELLBUTRIN  XL) 300 MG 24 hr tablet, Take 1 tablet (300 mg total) by mouth daily., Disp: 90 tablet, Rfl: 0   clonazePAM  (KLONOPIN ) 1 MG tablet, Take 1/2  to 1 tablet by mouth at bedtime as needed for sleep, Disp: 30 tablet, Rfl: 2   cyclobenzaprine  (FLEXERIL ) 10 MG tablet, Take 1 tablet (10 mg total) by mouth 3 (three) times daily as needed for muscle spasms., Disp: 30 tablet, Rfl: 5   estradiol  (ESTRACE ) 0.5 MG tablet, Take 0.5 mg by mouth daily., Disp: , Rfl:    ibuprofen  (ADVIL ) 600 MG tablet, Take 600 mg by mouth 3 (three) times daily., Disp: , Rfl:    levonorgestrel  (MIRENA ) 20 MCG/24HR IUD, 1 each by Intrauterine route once. June 2016, Disp: , Rfl:    rizatriptan  (MAXALT ) 10 MG tablet, TAKE 1 TABLET AS NEEDED FOR MIGRAINE. MAY REPEAT IN 2 HOURS IF NEEDED. MAX 2 TABS/24HRS, Disp: 10 tablet, Rfl: 3   senna (SENOKOT) 8.6 MG TABS tablet, Take 1 tablet by mouth., Disp: , Rfl:    Ubrogepant  (UBRELVY ) 100 MG TABS, Take 1 tablet (100 mg total) by mouth as needed. May repeat after 2 hours.  Maximum 2 tablets in 24 hours., Disp: 10 tablet, Rfl: 11   Insulin Pen Needle (BD PEN NEEDLE NANO U/F) 32G X 4 MM MISC, 1 each by Does not apply route at bedtime., Disp: 100 each, Rfl: 11   valACYclovir  (VALTREX ) 500 MG  tablet, Take 500 mg by mouth daily., Disp: , Rfl:   Current Facility-Administered Medications:    botulinum toxin Type A  (BOTOX ) injection 200 Units, 200 Units, Intramuscular, Once, Jaffe, Adam R, DO  Allergies  Allergen Reactions   Aleve  [Naproxen ] Hives   Codeine      REACTION: nausea Can take codeine  with phenergan      Review of Systems Constitutional: -fever, -chills, -sweats, -unexpected weight change, -decreased appetite, -fatigue Allergy: -sneezing, -itching, -congestion Dermatology: -changing moles, --rash, -lumps ENT: -runny nose, -ear pain, -sore throat, -hoarseness, -sinus pain, -teeth pain, - ringing in ears, -hearing loss, -nosebleeds Cardiology: -chest pain, -palpitations, -swelling, -difficulty breathing when lying flat, -waking up short of breath Respiratory: -cough, -shortness of breath, -difficulty breathing with exercise  or exertion, -wheezing, -coughing up blood Gastroenterology: -abdominal pain, -nausea, -vomiting, -diarrhea, +constipation, -blood in stool, -changes in bowel movement, -difficulty swallowing or eating Hematology: -bleeding, -bruising  Musculoskeletal: -joint aches, -muscle aches, -joint swelling, -back pain, -neck pain, -cramping, -changes in gait Ophthalmology: denies vision changes, eye redness, itching, discharge Urology: -burning with urination, -difficulty urinating, -blood in urine, -urinary frequency, -urgency, -incontinence Neurology: -headache, -weakness, -tingling, -numbness, -memory loss, -falls, -dizziness Psychology: -depressed mood, -agitation, -sleep problems Breast/gyn: -breast tendnerss, -discharge, -lumps, -vaginal discharge,- irregular periods, -heavy periods      08/09/2023    3:41 PM 06/23/2022    8:50 AM 02/23/2021    8:29 AM 11/14/2020    9:31 AM 08/06/2020    3:29 PM  Depression screen PHQ 2/9  Decreased Interest 0 0 0 0 0  Down, Depressed, Hopeless 0 0 0 0 0  PHQ - 2 Score 0 0 0 0 0  Altered sleeping 0   0 0  Tired, decreased energy 0   0 1  Change in appetite 0   0   Feeling bad or failure about yourself  0   0 0  Trouble concentrating 0   0 0  Moving slowly or fidgety/restless 0   0 0  Suicidal thoughts 0   0 0  PHQ-9 Score 0   0 1  Difficult doing work/chores Not difficult at all   Not difficult at all Not difficult at all       Objective:  BP 118/70   Pulse 78   Wt 184 lb 6.4 oz (83.6 kg)   BMI 33.73 kg/m   General appearance: alert, no distress, WD/WN, African American female Skin: scattered macules, no worrisome lesions HEENT: normocephalic, conjunctiva/corneas normal, sclerae anicteric, PERRLA, EOMi, nares patent, no discharge or erythema, pharynx normal, ears with impacted cerumen bilat Oral cavity: MMM, tongue normal, teeth normal Neck: supple, no lymphadenopathy, no thyromegaly, no masses, normal ROM, no bruits Chest: non tender, normal  shape and expansion Heart: RRR, normal S1, S2, no murmurs Lungs: CTA bilaterally, no wheezes, rhonchi, or rales Abdomen: +bs, soft, non tender, non distended, no masses, no hepatomegaly, no splenomegaly, no bruits Back: non tender, normal ROM, no scoliosis Musculoskeletal: upper extremities non tender, no obvious deformity, normal ROM throughout, lower extremities non tender, no obvious deformity, normal ROM throughout Extremities: no edema, no cyanosis, no clubbing Pulses: 2+ symmetric, upper and lower extremities, normal cap refill Neurological: alert, oriented x 3, CN2-12 intact, strength normal upper extremities and lower extremities, sensation normal throughout, DTRs 2+ throughout, no cerebellar signs, gait normal Psychiatric: normal affect, behavior normal, pleasant  Breast/gyn/rectal - deferred to gynecology    Assessment and Plan :   Encounter Diagnoses  Name Primary?   Encounter for health  maintenance examination in adult    Screening for hematuria or proteinuria    Screening for diabetes mellitus    Encounter for lipid screening for cardiovascular disease    Family history of colon cancer Yes   Chronic constipation      This visit was a preventative care visit, also known as wellness visit or routine physical.   Topics typically include healthy lifestyle, diet, exercise, preventative care, vaccinations, sick and well care, proper use of emergency dept and after hours care, as well as other concerns.     Recommendations: Continue to return yearly for your annual wellness and preventative care visits.  This gives us  a chance to discuss healthy lifestyle, exercise, vaccinations, review your chart record, and perform screenings where appropriate.  I recommend you see your eye doctor yearly for routine vision care.  I recommend you see your dentist yearly for routine dental care including hygiene visits twice yearly.   Vaccination recommendations were reviewed Immunization  History  Administered Date(s) Administered   HPV 9-valent 11/23/2021, 02/24/2022   Influenza Whole 10/04/2008   Influenza,inj,Quad PF,6+ Mos 09/10/2017, 10/28/2018, 09/23/2019   Influenza-Unspecified 10/28/2018, 10/03/2020, 09/29/2021, 09/27/2022   PFIZER(Purple Top)SARS-COV-2 Vaccination 01/26/2019, 02/16/2019, 09/23/2019   Pfizer Covid-19 Vaccine Bivalent Booster 61yrs & up 11/07/2020   Pfizer(Comirnaty)Fall Seasonal Vaccine 12 years and older 11/07/2021   Td 12/12/2008   Tdap 09/04/2020    Advise yearly flu shot   Screening for cancer: Colon cancer screening: Referral to GI given history of chronic constipation, family history of colon cancer  Breast cancer screening: You should perform a self breast exam monthly.   We reviewed recommendations for regular mammograms and breast cancer screening.  Cervical cancer screening: We reviewed recommendations for pap smear screening.   Skin cancer screening: Check your skin regularly for new changes, growing lesions, or other lesions of concern Come in for evaluation if you have skin lesions of concern.  Lung cancer screening: If you have a greater than 20 pack year history of tobacco use, then you may qualify for lung cancer screening with a chest CT scan.   Please call your insurance company to inquire about coverage for this test.  We currently don't have screenings for other cancers besides breast, cervical, colon, and lung cancers.  If you have a strong family history of cancer or have other cancer screening concerns, please let me know.    Bone health: Get at least 150 minutes of aerobic exercise weekly Get weight bearing exercise at least once weekly Bone density test:  A bone density test is an imaging test that uses a type of X-ray to measure the amount of calcium and other minerals in your bones. The test may be used to diagnose or screen you for a condition that causes weak or thin bones (osteoporosis), predict your risk  for a broken bone (fracture), or determine how well your osteoporosis treatment is working. The bone density test is recommended for females 65 and older, or females or males <65 if certain risk factors such as thyroid  disease, long term use of steroids such as for asthma or rheumatological issues, vitamin D  deficiency, estrogen deficiency, family history of osteoporosis, self or family history of fragility fracture in first degree relative.    Heart health: Get at least 150 minutes of aerobic exercise weekly Limit alcohol It is important to maintain a healthy blood pressure and healthy cholesterol numbers  Heart disease screening: Screening for heart disease includes screening for blood pressure, fasting lipids, glucose/diabetes  screening, BMI height to weight ratio, reviewed of smoking status, physical activity, and diet.    Goals include blood pressure 120/80 or less, maintaining a healthy lipid/cholesterol profile, preventing diabetes or keeping diabetes numbers under good control, not smoking or using tobacco products, exercising most days per week or at least 150 minutes per week of exercise, and eating healthy variety of fruits and vegetables, healthy oils, and avoiding unhealthy food choices like fried food, fast food, high sugar and high cholesterol foods.    Other tests may possibly include EKG test, CT coronary calcium score, echocardiogram, exercise treadmill stress test.    Medical care options: I recommend you continue to seek care here first for routine care.  We try really hard to have available appointments Monday through Friday daytime hours for sick visits, acute visits, and physicals.  Urgent care should be used for after hours and weekends for significant issues that cannot wait till the next day.  The emergency department should be used for significant potentially life-threatening emergencies.  The emergency department is expensive, can often have long wait times for less  significant concerns, so try to utilize primary care, urgent care, or telemedicine when possible to avoid unnecessary trips to the emergency department.  Virtual visits and telemedicine have been introduced since the pandemic started in 2020, and can be convenient ways to receive medical care.  We offer virtual appointments as well to assist you in a variety of options to seek medical care.    Dasja was seen today for annual exam.  Diagnoses and all orders for this visit:  Family history of colon cancer -     Ambulatory referral to Gastroenterology  Encounter for health maintenance examination in adult -     Urinalysis, Routine w reflex microscopic -     Hemoglobin A1c -     TSH -     Lipid panel -     CBC -     Comprehensive metabolic panel with GFR -     Ambulatory referral to Gastroenterology  Screening for hematuria or proteinuria -     Urinalysis, Routine w reflex microscopic  Screening for diabetes mellitus -     Hemoglobin A1c  Encounter for lipid screening for cardiovascular disease -     Lipid panel  Chronic constipation -     Ambulatory referral to Gastroenterology   Follow-up pending labs, yearly for physical

## 2023-08-10 ENCOUNTER — Ambulatory Visit: Payer: Self-pay | Admitting: Medical

## 2023-08-10 LAB — COMPREHENSIVE METABOLIC PANEL WITH GFR
ALT: 18 IU/L (ref 0–32)
AST: 21 IU/L (ref 0–40)
Albumin: 4.2 g/dL (ref 3.9–4.9)
Alkaline Phosphatase: 71 IU/L (ref 44–121)
BUN/Creatinine Ratio: 11 (ref 9–23)
BUN: 10 mg/dL (ref 6–24)
Bilirubin Total: 0.6 mg/dL (ref 0.0–1.2)
CO2: 24 mmol/L (ref 20–29)
Calcium: 8.9 mg/dL (ref 8.7–10.2)
Chloride: 103 mmol/L (ref 96–106)
Creatinine, Ser: 0.95 mg/dL (ref 0.57–1.00)
Globulin, Total: 2.2 g/dL (ref 1.5–4.5)
Glucose: 80 mg/dL (ref 70–99)
Potassium: 4.1 mmol/L (ref 3.5–5.2)
Sodium: 141 mmol/L (ref 134–144)
Total Protein: 6.4 g/dL (ref 6.0–8.5)
eGFR: 76 mL/min/1.73 (ref >59)

## 2023-08-10 LAB — CBC
Hematocrit: 42.8 % (ref 34.0–46.6)
Hemoglobin: 13.2 g/dL (ref 11.1–15.9)
MCH: 27.6 pg (ref 26.6–33.0)
MCHC: 30.8 g/dL — ABNORMAL LOW (ref 31.5–35.7)
MCV: 89 fL (ref 79–97)
Platelets: 248 x10E3/uL (ref 150–450)
RBC: 4.79 x10E6/uL (ref 3.77–5.28)
RDW: 11.8 % (ref 11.7–15.4)
WBC: 4.3 x10E3/uL (ref 3.4–10.8)

## 2023-08-10 LAB — URINALYSIS, ROUTINE W REFLEX MICROSCOPIC
Specific Gravity, UA: 1.016 (ref 1.005–1.030)
Urobilinogen, Ur: 1 mg/dL (ref 0.2–1.0)
pH, UA: 7 (ref 5.0–7.5)

## 2023-08-10 LAB — TSH: TSH: 1.07 u[IU]/mL (ref 0.450–4.500)

## 2023-08-10 LAB — HEMOGLOBIN A1C
Est. average glucose Bld gHb Est-mCnc: 97 mg/dL
Hgb A1c MFr Bld: 5 (ref 4.8–5.6)

## 2023-08-10 LAB — LIPID PANEL
Cholesterol, Total: 149 mg/dL (ref 100–199)
HDL: 63 mg/dL (ref >39)
LDL CALC COMMENT:: 2.4 ratio (ref 0.0–4.4)
LDL Chol Calc (NIH): 75 mg/dL (ref 0–99)
Triglycerides: 53 mg/dL (ref 0–149)
VLDL Cholesterol Cal: 11 mg/dL (ref 5–40)

## 2023-08-10 NOTE — Progress Notes (Signed)
Results through My Chart

## 2023-08-16 ENCOUNTER — Other Ambulatory Visit: Payer: Self-pay | Admitting: Medical

## 2023-08-16 DIAGNOSIS — Z419 Encounter for procedure for purposes other than remedying health state, unspecified: Secondary | ICD-10-CM | POA: Diagnosis not present

## 2023-08-16 MED ORDER — LINACLOTIDE 290 MCG PO CAPS
290.0000 ug | ORAL_CAPSULE | Freq: Every day | ORAL | 0 refills | Status: DC
Start: 2023-08-16 — End: 2023-10-24

## 2023-09-07 ENCOUNTER — Encounter: Payer: Self-pay | Admitting: Neurology

## 2023-09-09 ENCOUNTER — Other Ambulatory Visit (HOSPITAL_COMMUNITY): Payer: Self-pay

## 2023-09-09 NOTE — Telephone Encounter (Signed)
 PA effective until 12.18.2025. No PA required for CPT code.

## 2023-09-16 DIAGNOSIS — Z419 Encounter for procedure for purposes other than remedying health state, unspecified: Secondary | ICD-10-CM | POA: Diagnosis not present

## 2023-09-19 ENCOUNTER — Other Ambulatory Visit: Payer: Self-pay | Admitting: Neurology

## 2023-09-19 ENCOUNTER — Other Ambulatory Visit (HOSPITAL_COMMUNITY): Payer: Self-pay

## 2023-09-27 DIAGNOSIS — F4310 Post-traumatic stress disorder, unspecified: Secondary | ICD-10-CM | POA: Diagnosis not present

## 2023-09-27 DIAGNOSIS — F331 Major depressive disorder, recurrent, moderate: Secondary | ICD-10-CM | POA: Diagnosis not present

## 2023-09-27 DIAGNOSIS — F4321 Adjustment disorder with depressed mood: Secondary | ICD-10-CM | POA: Diagnosis not present

## 2023-09-30 ENCOUNTER — Ambulatory Visit: Admitting: Neurology

## 2023-10-03 ENCOUNTER — Telehealth: Payer: Self-pay

## 2023-10-03 ENCOUNTER — Other Ambulatory Visit: Payer: Self-pay

## 2023-10-03 ENCOUNTER — Other Ambulatory Visit (HOSPITAL_COMMUNITY): Payer: Self-pay

## 2023-10-03 ENCOUNTER — Other Ambulatory Visit: Payer: Self-pay | Admitting: Neurology

## 2023-10-03 MED ORDER — ONABOTULINUMTOXINA 200 UNITS IJ SOLR
200.0000 [IU] | INTRAMUSCULAR | 4 refills | Status: AC
  Filled 2023-10-03 – 2023-10-07 (×3): qty 1, 90d supply, fill #0
  Filled 2024-01-10 – 2024-01-11 (×3): qty 1, 90d supply, fill #1

## 2023-10-03 NOTE — Telephone Encounter (Signed)
 PA effective until 12.18.2025

## 2023-10-05 ENCOUNTER — Other Ambulatory Visit: Payer: Self-pay

## 2023-10-05 ENCOUNTER — Encounter: Payer: Self-pay | Admitting: Medical

## 2023-10-06 ENCOUNTER — Other Ambulatory Visit: Payer: Self-pay

## 2023-10-07 ENCOUNTER — Other Ambulatory Visit: Payer: Self-pay | Admitting: Pharmacy Technician

## 2023-10-07 ENCOUNTER — Other Ambulatory Visit: Payer: Self-pay

## 2023-10-07 NOTE — Progress Notes (Signed)
 Specialty Pharmacy Refill Coordination Note  Mariah Ferrell is a 43 y.o. female contacted today regarding refills of specialty medication(s) OnabotulinumtoxinA  (BOTOX )   Patient requested Courier to Provider Office   Delivery date: 10/10/23   Verified address: [facility] [address]   Medication will be filled on 10/07/23.

## 2023-10-14 ENCOUNTER — Ambulatory Visit (INDEPENDENT_AMBULATORY_CARE_PROVIDER_SITE_OTHER): Admitting: Neurology

## 2023-10-14 DIAGNOSIS — G43709 Chronic migraine without aura, not intractable, without status migrainosus: Secondary | ICD-10-CM | POA: Diagnosis not present

## 2023-10-14 MED ORDER — ONABOTULINUMTOXINA 100 UNITS IJ SOLR
200.0000 [IU] | Freq: Once | INTRAMUSCULAR | Status: AC
  Administered 2023-10-14: 155 [IU] via INTRAMUSCULAR

## 2023-10-14 NOTE — Progress Notes (Signed)

## 2023-10-16 DIAGNOSIS — Z419 Encounter for procedure for purposes other than remedying health state, unspecified: Secondary | ICD-10-CM | POA: Diagnosis not present

## 2023-10-22 ENCOUNTER — Other Ambulatory Visit: Payer: Self-pay | Admitting: Medical

## 2023-11-05 ENCOUNTER — Ambulatory Visit: Admission: EM | Admit: 2023-11-05 | Discharge: 2023-11-05 | Disposition: A | Discharge status: Home / Self Care

## 2023-11-05 DIAGNOSIS — G43909 Migraine, unspecified, not intractable, without status migrainosus: Secondary | ICD-10-CM | POA: Diagnosis not present

## 2023-11-05 MED ORDER — KETOROLAC TROMETHAMINE 30 MG/ML IJ SOLN
30.0000 mg | Freq: Once | INTRAMUSCULAR | Status: AC
  Administered 2023-11-05: 30 mg via INTRAMUSCULAR

## 2023-11-05 MED ORDER — DEXAMETHASONE SOD PHOSPHATE PF 10 MG/ML IJ SOLN
10.0000 mg | Freq: Once | INTRAMUSCULAR | Status: AC
  Administered 2023-11-05: 10 mg via INTRAMUSCULAR

## 2023-11-05 MED ORDER — DIPHENHYDRAMINE HCL 50 MG/ML IJ SOLN
25.0000 mg | Freq: Once | INTRAMUSCULAR | Status: AC
  Administered 2023-11-05: 25 mg via INTRAMUSCULAR

## 2023-11-05 NOTE — ED Provider Notes (Signed)
 [facility]    CSN: [id number] Arrival date & time: 11/05/23  0843      History   Chief Complaint Chief Complaint  Patient presents with   Migraine    HPI Mariah Ferrell is a 43 y.o. female.   Patient with a history of migraines, presents today due to migraine that started yesterday.  Patient states that she is experiencing pain that is a 10/10.  Patient states that she took Maxalt  and Phenergan  this morning before coming without relief.  Patient had 1 episode of vomiting while in office.   Migraine    Past Medical History:  Diagnosis Date   Anxiety and depression    Constipation    chronic   GERD (gastroesophageal reflux disease)    HLA B27 positive    IUD (intrauterine device) in place    Mirena    Migraines     Patient Active Problem List   Diagnosis Date Noted   BMI 28.0-28.9,adult 06/09/2021   Elevated serum creatinine 06/09/2021   Vitamin D  deficiency 06/02/2021   Screening for diabetes mellitus 02/23/2021   Impacted cerumen of both ears 02/23/2021   Encounter for health maintenance examination in adult 11/22/2019   Chronic constipation 11/22/2019   History of COVID-19 11/22/2019   Vaccine counseling 11/22/2019   Paresthesia of thumb of right hand 05/31/2018   History of neck pain 05/31/2018   Family history of autoimmune disorder 12/23/2017   Food intolerance 12/23/2017   HLA B27 (HLA B27 positive) 12/23/2017   Screening for heart disease 12/23/2017   High risk medication use 12/23/2017   Choledocholithiasis with obstruction and epigastric pain status post ERCP/sphincterotomy and laparoscopic cholecystectomy 05/24/2012   Migraine 02/09/2007    Past Surgical History:  Procedure Laterality Date   BREAST BIOPSY Left 40   benign lesion   CHOLECYSTECTOMY N/A 05/26/2012   Procedure: LAPAROSCOPIC CHOLECYSTECTOMY WITH INTRAOPERATIVE CHOLANGIOGRAM;  Surgeon: Sherlean JINNY Laughter, MD;  Location: [facility];  Service: General;  Laterality: N/A;    ERCP N/A 05/25/2012   Procedure: ENDOSCOPIC RETROGRADE CHOLANGIOPANCREATOGRAPHY (ERCP);  Surgeon: Elsie Cree, MD;  Location: [facility] OR;  Service: Endoscopy;  Laterality: N/A;   EYE SURGERY  2004   Lasik   WISDOM TOOTH EXTRACTION      OB History   No obstetric history on file.      Home Medications    Prior to Admission medications   Medication Sig Start Date End Date Taking? Authorizing Provider  botulinum toxin Type A  (BOTOX ) 200 units injection Inject 200 Units into the muscle every 3 (three) months. Inject 155 units IM into multiple site in the face,neck and head once every 90 days 10/03/23   Skeet Juliene SAUNDERS, DO  buPROPion  (WELLBUTRIN  XL) 300 MG 24 hr tablet Take 1 tablet (300 mg total) by mouth daily. 10/13/21   Tysinger, Alm RAMAN, PA-C  clonazePAM  (KLONOPIN ) 1 MG tablet Take 1/2 to 1 tablet by mouth at bedtime as needed for sleep 01/14/21   Sherril Camelia JINNY, NP  cyclobenzaprine  (FLEXERIL ) 10 MG tablet Take 1 tablet (10 mg total) by mouth 3 (three) times daily as needed for muscle spasms. 06/15/23   Skeet Juliene SAUNDERS, DO  estradiol  (ESTRACE ) 0.5 MG tablet Take 0.5 mg by mouth daily.    [provider]  ibuprofen  (ADVIL ) 600 MG tablet Take 600 mg by mouth 3 (three) times daily. 07/17/20   [provider]  Insulin Pen Needle (BD PEN NEEDLE NANO U/F) 32G X 4 MM MISC 1 each  by Does not apply route at bedtime. 09/12/19   Tysinger, Alm RAMAN, PA-C  levonorgestrel  (MIRENA ) 20 MCG/24HR IUD 1 each by Intrauterine route once. June 2016    [provider]  linaclotide  (LINZESS ) 290 MCG CAPS capsule TAKE 1 CAPSULE DAILY BEFORE BREAKFAST 10/24/23   Tysinger, Alm RAMAN, PA-C  rizatriptan  (MAXALT ) 10 MG tablet TAKE 1 TABLET AS NEEDED FOR MIGRAINE. MAY REPEAT IN 2 HOURS IF NEEDED. MAX 2 TABS/24HRS 03/28/23   Skeet Juliene SAUNDERS, DO  senna (SENOKOT) 8.6 MG TABS tablet Take 1 tablet by mouth.    [provider]  Ubrogepant  (UBRELVY ) 100 MG TABS Take 1 tablet (100 mg total) by mouth as  needed. May repeat after 2 hours.  Maximum 2 tablets in 24 hours. 06/15/23   Skeet Juliene SAUNDERS, DO  valACYclovir  (VALTREX ) 500 MG tablet Take 500 mg by mouth daily.    [provider]    Family History Family History  Problem Relation Age of Onset   Hypertension Mother    Diabetes Mother        prediabetes   Arthritis Mother    Benign prostatic hyperplasia Father    Depression Sister    Polycystic ovary syndrome Sister    Diabetes Sister    Healthy Daughter    Cancer Maternal Uncle        colon   Pulmonary disease Maternal Grandmother    Alzheimer's disease Paternal Grandmother    Diabetes Paternal Grandfather    Breast cancer Cousin    Cancer Cousin        breast   Heart disease Neg Hx     Social History Social History   Tobacco Use   Smoking status: Never   Smokeless tobacco: Never  Vaping Use   Vaping status: Never Used  Substance Use Topics   Alcohol use: Yes    Alcohol/week: 5.0 standard drinks of alcohol    Types: 5 Standard drinks or equivalent per week    Comment: cider 5-7 days per week   Drug use: No     Allergies   Aleve  [naproxen ] and Codeine    Review of Systems Review of Systems   Physical Exam Triage Vital Signs ED Triage Vitals  Encounter Vitals Group     BP 11/05/23 0912 126/86     Girls Systolic BP Percentile --      Girls Diastolic BP Percentile --      Boys Systolic BP Percentile --      Boys Diastolic BP Percentile --      Pulse Rate 11/05/23 0912 70     Resp --      Temp 11/05/23 0912 (!) 97.5 F (36.4 C)     Temp Source 11/05/23 0912 Oral     SpO2 11/05/23 0912 97 %     Weight 11/05/23 0908 180 lb (81.6 kg)     Height 11/05/23 0908 5' 4 (1.626 m)     Head Circumference --      Peak Flow --      Pain Score 11/05/23 0908 10     Pain Loc --      Pain Education --      Exclude from Growth Chart --    No data found.  Updated Vital Signs BP 126/86 (BP Location: Left Arm)   Pulse 70   Temp (!) 97.5 F (36.4 C)  (Oral)   Ht 5' 4 (1.626 m)   Wt 180 lb (81.6 kg)   SpO2 97%  BMI 30.90 kg/m   Visual Acuity Right Eye Distance:   Left Eye Distance:   Bilateral Distance:    Right Eye Near:   Left Eye Near:    Bilateral Near:     Physical Exam   UC Treatments / Results  Labs (all labs ordered are listed, but only abnormal results are displayed) Labs Reviewed - No data to display  EKG   Radiology No results found.  Procedures Procedures (including critical care time)  Medications Ordered in UC Medications  ketorolac  (TORADOL ) 30 MG/ML injection 30 mg (30 mg Intramuscular Given 11/05/23 0949)  dexamethasone  (DECADRON ) injection 10 mg (10 mg Intramuscular Given 11/05/23 0949)  diphenhydrAMINE  (BENADRYL ) injection 25 mg (25 mg Intramuscular Given 11/05/23 0947)    Initial Impression / Assessment and Plan / UC Course  I have reviewed the triage vital signs and the nursing notes.  Pertinent labs & imaging results that were available during my care of the patient were reviewed by me and considered in my medical decision making (see chart for details).     Patient experienced relief with in office medications. Final Clinical Impressions(s) / UC Diagnoses   Final diagnoses:  Persistent migraine aura without cerebral infarction and without status migrainosus, not intractable   Discharge Instructions   None    ED Prescriptions   None    PDMP not reviewed this encounter.   Andra Corean BROCKS, PA-C 11/05/23 1018

## 2023-11-05 NOTE — ED Triage Notes (Signed)
 Mariah Ferrell presents with a migraine and nausea that started last night. Treated with a muscle relaxer last night and Maxalt  and Vinegar this morning without relief. She vomited a lot in room.

## 2023-11-16 DIAGNOSIS — Z419 Encounter for procedure for purposes other than remedying health state, unspecified: Secondary | ICD-10-CM | POA: Diagnosis not present

## 2023-12-03 ENCOUNTER — Other Ambulatory Visit: Payer: Self-pay | Admitting: Neurology

## 2023-12-03 DIAGNOSIS — G43009 Migraine without aura, not intractable, without status migrainosus: Secondary | ICD-10-CM

## 2023-12-03 DIAGNOSIS — G43709 Chronic migraine without aura, not intractable, without status migrainosus: Secondary | ICD-10-CM

## 2023-12-06 ENCOUNTER — Other Ambulatory Visit (HOSPITAL_COMMUNITY): Payer: Self-pay

## 2023-12-06 ENCOUNTER — Encounter: Payer: Self-pay | Admitting: Neurology

## 2023-12-06 DIAGNOSIS — G43009 Migraine without aura, not intractable, without status migrainosus: Secondary | ICD-10-CM

## 2023-12-06 DIAGNOSIS — G43709 Chronic migraine without aura, not intractable, without status migrainosus: Secondary | ICD-10-CM

## 2023-12-06 MED ORDER — UBRELVY 100 MG PO TABS: 100.0000 mg | ORAL_TABLET | ORAL | 11 refills | Status: AC | PRN

## 2023-12-06 NOTE — Telephone Encounter (Signed)
 No PA needed. Filled today

## 2023-12-20 DIAGNOSIS — F4310 Post-traumatic stress disorder, unspecified: Secondary | ICD-10-CM | POA: Diagnosis not present

## 2023-12-20 DIAGNOSIS — F331 Major depressive disorder, recurrent, moderate: Secondary | ICD-10-CM | POA: Diagnosis not present

## 2023-12-20 DIAGNOSIS — F4321 Adjustment disorder with depressed mood: Secondary | ICD-10-CM | POA: Diagnosis not present

## 2023-12-20 DIAGNOSIS — F413 Other mixed anxiety disorders: Secondary | ICD-10-CM | POA: Diagnosis not present

## 2023-12-21 IMAGING — MG DIGITAL DIAGNOSTIC BILAT W/ TOMO W/ CAD
8 series · 8 of 24 positions shown · non-contrast
Comparison: Previous exam(s).

CLINICAL DATA: 40-year-old female presenting for likely benign
bilateral breast masses. These were last evaluated in Friday July, 2019.



[L CC synth-2D]
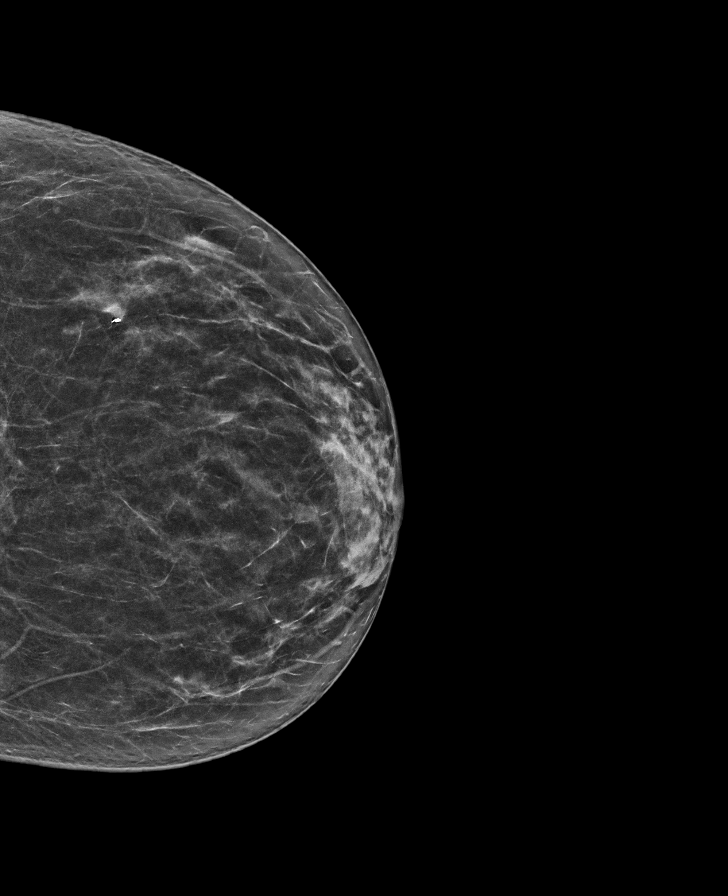

[R CC synth-2D]
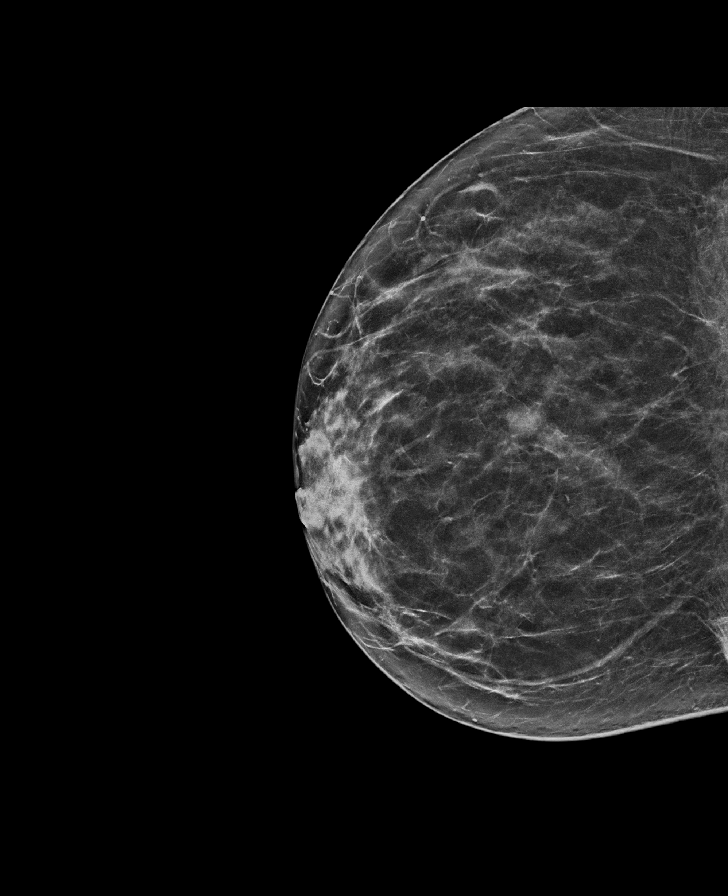

[L MLO synth-2D]
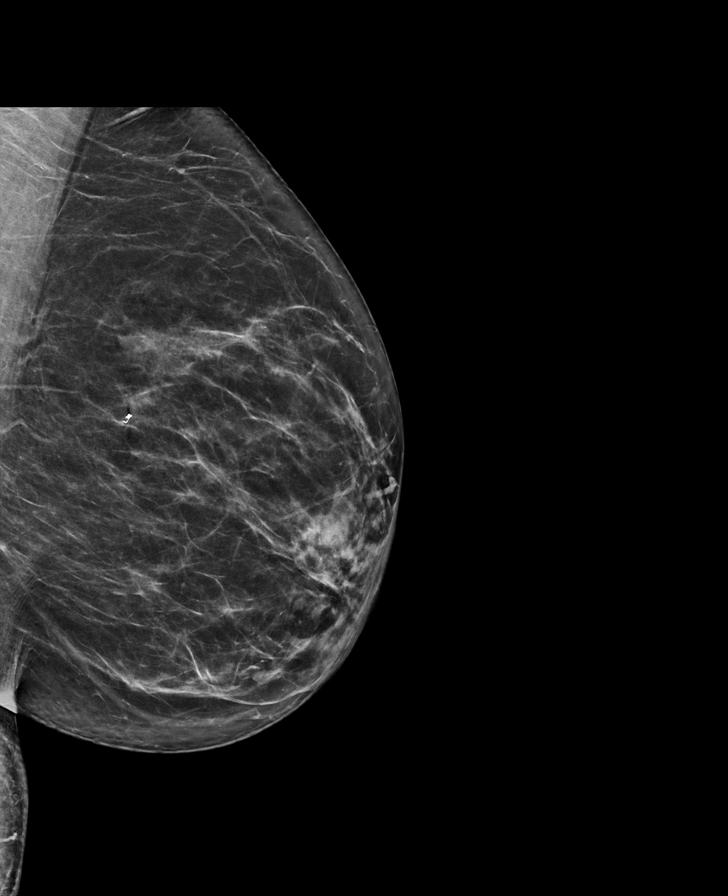

[R MLO synth-2D]
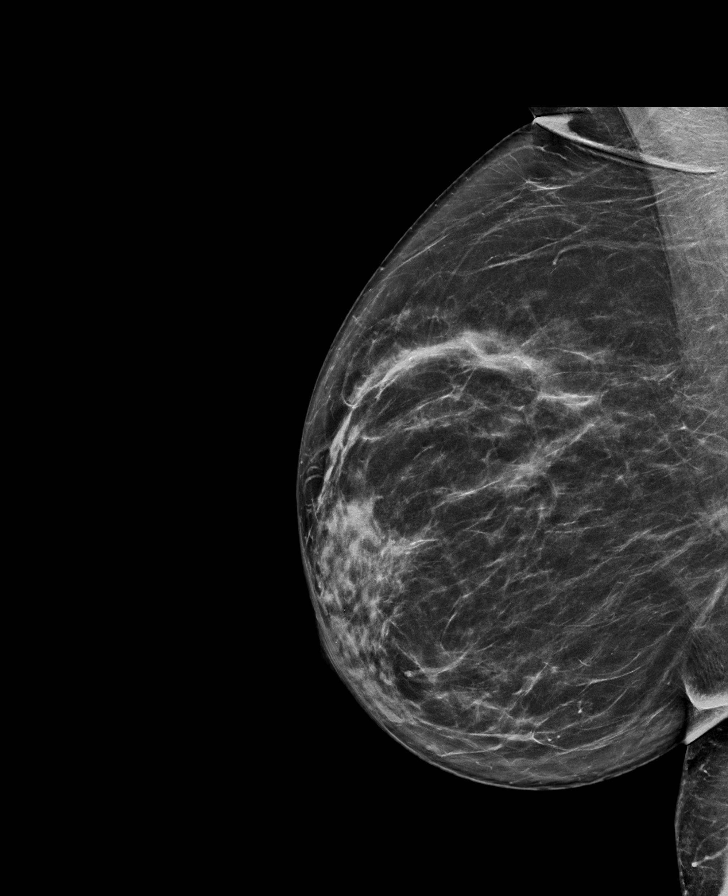

[R CC tomo · tomo slice 37/72.0]
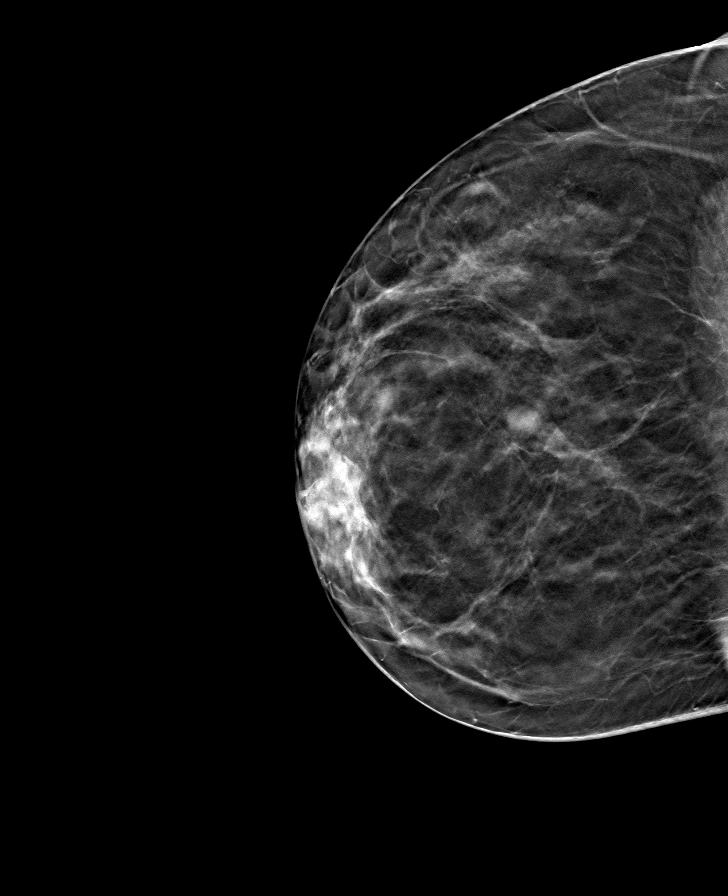

[L MLO tomo · tomo slice 37/73.0]
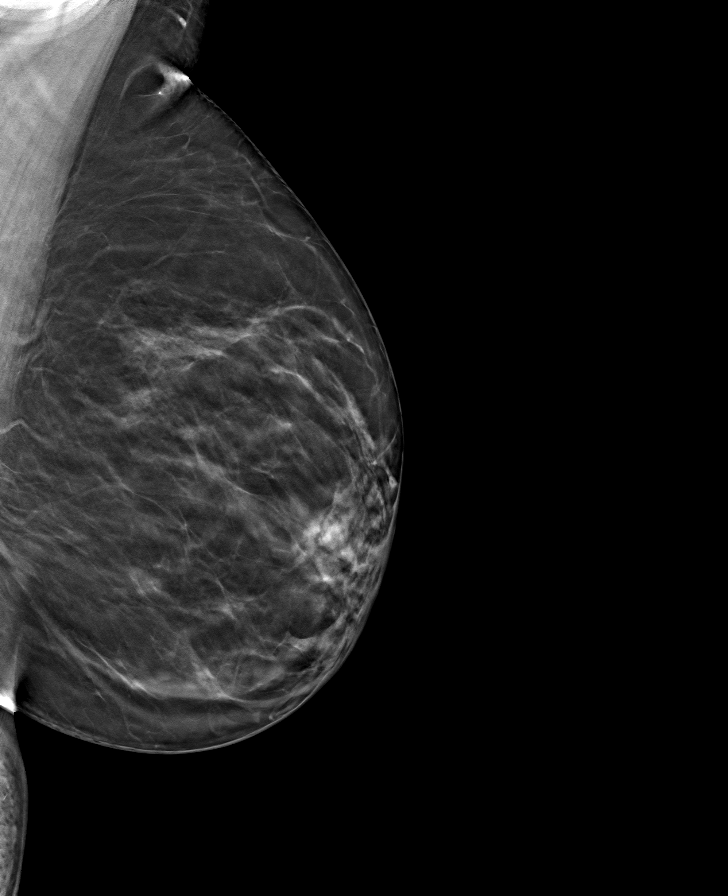

[L CC tomo · tomo slice 34/67.0]
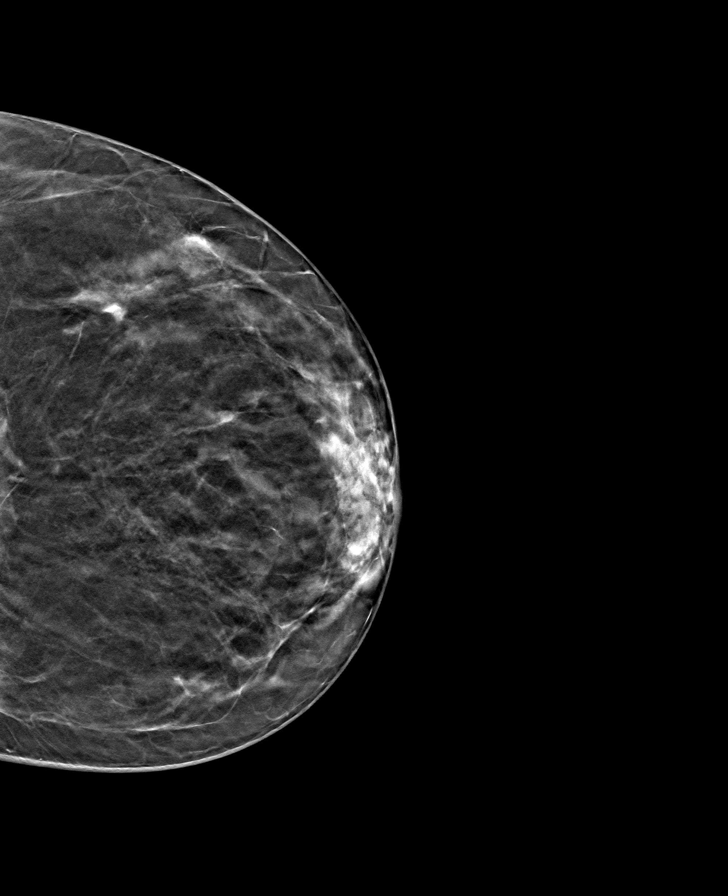

[R MLO tomo · tomo slice 39/78.0]
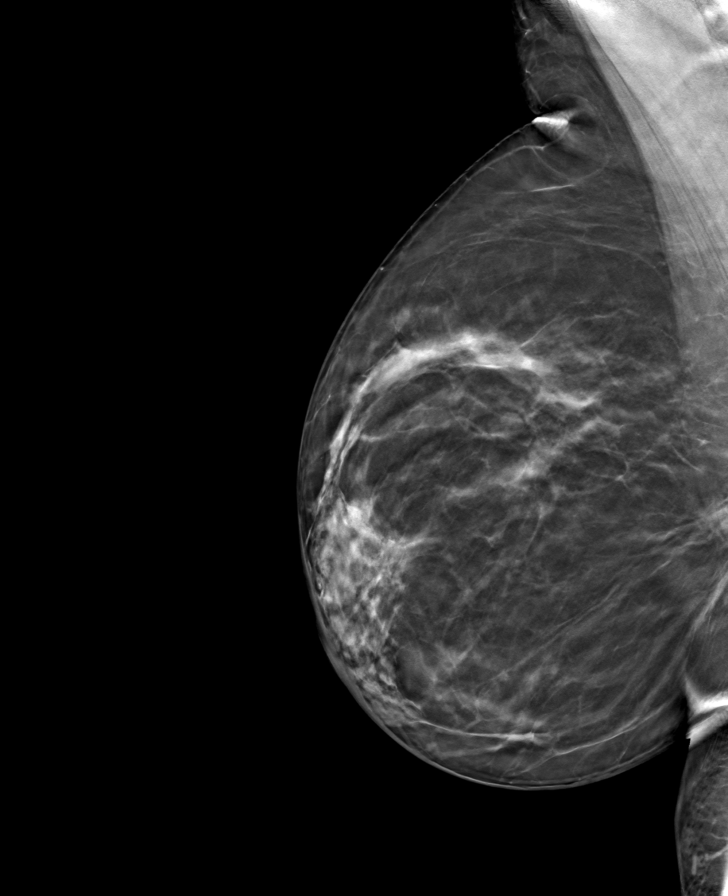

[8 of 24 positions shown; findings below may reference images not displayed]

ACR Breast Density Category b: There are scattered areas of
fibroglandular density.
FINDINGS: The mass in the superior right breast and inferior left breast are
mammographically stable. No suspicious calcifications, masses or
areas of distortion are seen in the bilateral breasts.

The mass in the right breast at 12 o'clock, 6 cm from the nipple
measures 9 x 3 x 6 mm, previously 9 x 4 x 6 mm.

Mass in the left breast at 6 o'clock, 2 cm from the nipple measures
10 x 3 x 5 mm, previously 9 x 3 x 6 mm.
IMPRESSION: 1.  The likely benign masses in the bilateral breasts are stable.

2.  No mammographic evidence of malignancy in the bilateral breasts.

RECOMMENDATION:
Bilateral diagnostic mammogram and bilateral ultrasound in 1 year.

I have discussed the findings and recommendations with the patient.
If applicable, a reminder letter will be sent to the patient
regarding the next appointment.

BI-RADS CATEGORY  3: Probably benign.

## 2023-12-23 ENCOUNTER — Encounter: Payer: Self-pay | Admitting: Neurology

## 2023-12-23 ENCOUNTER — Ambulatory Visit (INDEPENDENT_AMBULATORY_CARE_PROVIDER_SITE_OTHER)

## 2023-12-23 DIAGNOSIS — G43709 Chronic migraine without aura, not intractable, without status migrainosus: Secondary | ICD-10-CM

## 2023-12-23 MED ORDER — KETOROLAC TROMETHAMINE 60 MG/2ML IM SOLN
60.0000 mg | Freq: Once | INTRAMUSCULAR | Status: AC
  Administered 2023-12-23: 60 mg via INTRAMUSCULAR

## 2023-12-23 MED ORDER — DIPHENHYDRAMINE HCL 50 MG/ML IJ SOLN
50.0000 mg | Freq: Once | INTRAMUSCULAR | Status: AC
  Administered 2023-12-23: 25 mg via INTRAMUSCULAR

## 2023-12-23 MED ORDER — METOCLOPRAMIDE HCL 5 MG/ML IJ SOLN
10.0000 mg | Freq: Once | INTRAMUSCULAR | Status: AC
  Administered 2023-12-23: 10 mg via INTRAMUSCULAR

## 2023-12-23 NOTE — Progress Notes (Signed)
 Per patient she can have Toradol .   Patient tolerated well.

## 2023-12-26 ENCOUNTER — Other Ambulatory Visit: Payer: Self-pay

## 2023-12-27 ENCOUNTER — Other Ambulatory Visit: Payer: Self-pay

## 2024-01-04 DIAGNOSIS — Z01419 Encounter for gynecological examination (general) (routine) without abnormal findings: Secondary | ICD-10-CM | POA: Diagnosis not present

## 2024-01-04 DIAGNOSIS — Z8742 Personal history of other diseases of the female genital tract: Secondary | ICD-10-CM | POA: Diagnosis not present

## 2024-01-04 DIAGNOSIS — N951 Menopausal and female climacteric states: Secondary | ICD-10-CM | POA: Diagnosis not present

## 2024-01-04 DIAGNOSIS — Z130 Encounter for screening for diseases of the blood and blood-forming organs and certain disorders involving the immune mechanism: Secondary | ICD-10-CM | POA: Diagnosis not present

## 2024-01-06 ENCOUNTER — Telehealth: Payer: Self-pay

## 2024-01-06 NOTE — Telephone Encounter (Signed)
 PA Botox  expired 12/2023.

## 2024-01-10 ENCOUNTER — Telehealth: Payer: Self-pay

## 2024-01-10 ENCOUNTER — Other Ambulatory Visit (HOSPITAL_COMMUNITY): Payer: Self-pay

## 2024-01-10 NOTE — Telephone Encounter (Signed)
 Pharmacy Patient Advocate Encounter   Received notification from Pt Calls Messages that prior authorization for Botox  200UNIT solution is required/requested.   Insurance verification completed.   The patient is insured through [employer].   Prior Authorization for Botox  200UNIT solution has been APPROVED from 01-10-2024 to 01-09-2025. Ran test claim, Copay is $170.00. This test claim was processed through [facility]- copay amounts may vary at other pharmacies due to pharmacy/plan contracts, or as the patient moves through the different stages of their insurance plan.   PA #/Case ID/Reference #: [id number]   *Pharmacy Benefits

## 2024-01-10 NOTE — Telephone Encounter (Signed)
 Yes, patient can continue to use [facility].

## 2024-01-11 ENCOUNTER — Other Ambulatory Visit (HOSPITAL_COMMUNITY): Payer: Self-pay

## 2024-01-11 NOTE — Progress Notes (Signed)
 Copay card has been updated

## 2024-01-11 NOTE — Telephone Encounter (Signed)
" ° °  Call place to pt. Pt qualifies for botox  savings program. Copay card has been provided to the pharmacy and pt has been informed of reduced copay of $0.  "

## 2024-01-11 NOTE — Progress Notes (Signed)
 Specialty Pharmacy Refill Coordination Note  Mariah Ferrell is a 43 y.o. female assessed today regarding refills of clinic administered specialty medication(s) OnabotulinumtoxinA  (BOTOX )   Clinic requested Courier to Provider Office   Delivery date: 01/17/24   Verified address: [facility] [address]   Medication will be filled on: 01/16/24

## 2024-01-13 ENCOUNTER — Encounter: Payer: Self-pay | Admitting: Neurology

## 2024-01-16 ENCOUNTER — Other Ambulatory Visit: Payer: Self-pay

## 2024-01-18 ENCOUNTER — Encounter: Payer: Self-pay | Admitting: Neurology

## 2024-01-20 ENCOUNTER — Ambulatory Visit: Admitting: Neurology

## 2024-01-27 DIAGNOSIS — Z0279 Encounter for issue of other medical certificate: Secondary | ICD-10-CM

## 2024-02-08 NOTE — Telephone Encounter (Signed)
 [employer](Self) called in regaring FMLA paper work. She stated that the paper work was received, but it was blank. She is wanting to know if it can be faxed again. She also mentioned due to it not being filled out it was denied. She is requesting a call back to confirm that it was sent or a Mychart message.   Fax #: [phone]  Pts Ph: [phone]

## 2024-03-02 ENCOUNTER — Ambulatory Visit: Admitting: Neurology

## 2024-03-29 IMAGING — CR DG LUMBAR SPINE 2-3V
3 series · 3 of 3 positions shown · non-contrast
Comparison: 07/26/2016

CLINICAL DATA: Low back pain radiating into the left buttocks after
sitting in a car for a while

EXAM:
LUMBAR SPINE - 3 VIEW

[w lumbar spine ap]
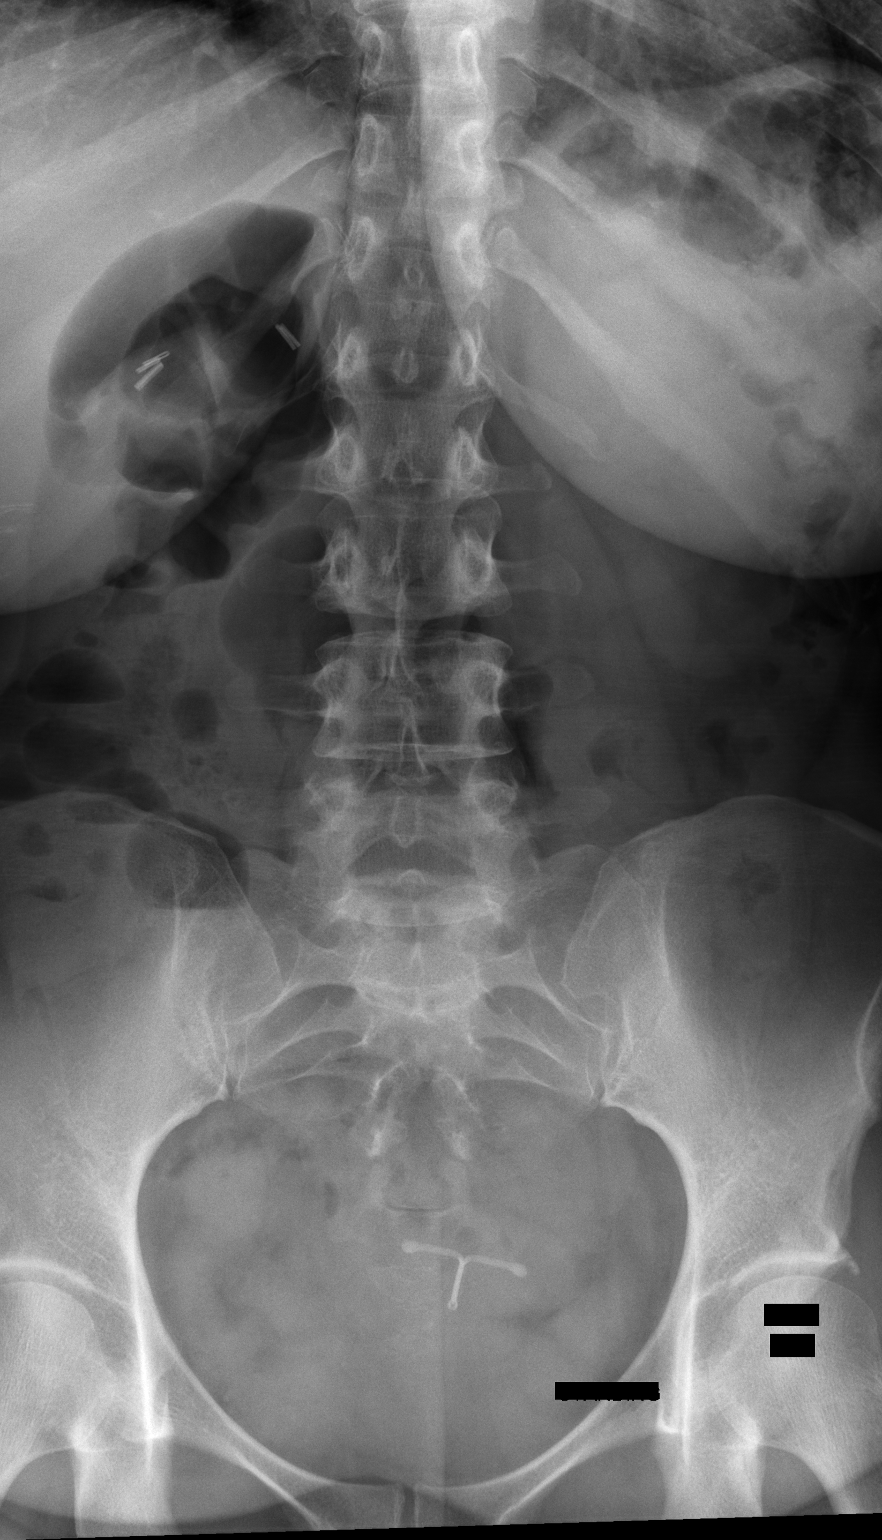

[w lumbar spine lat]
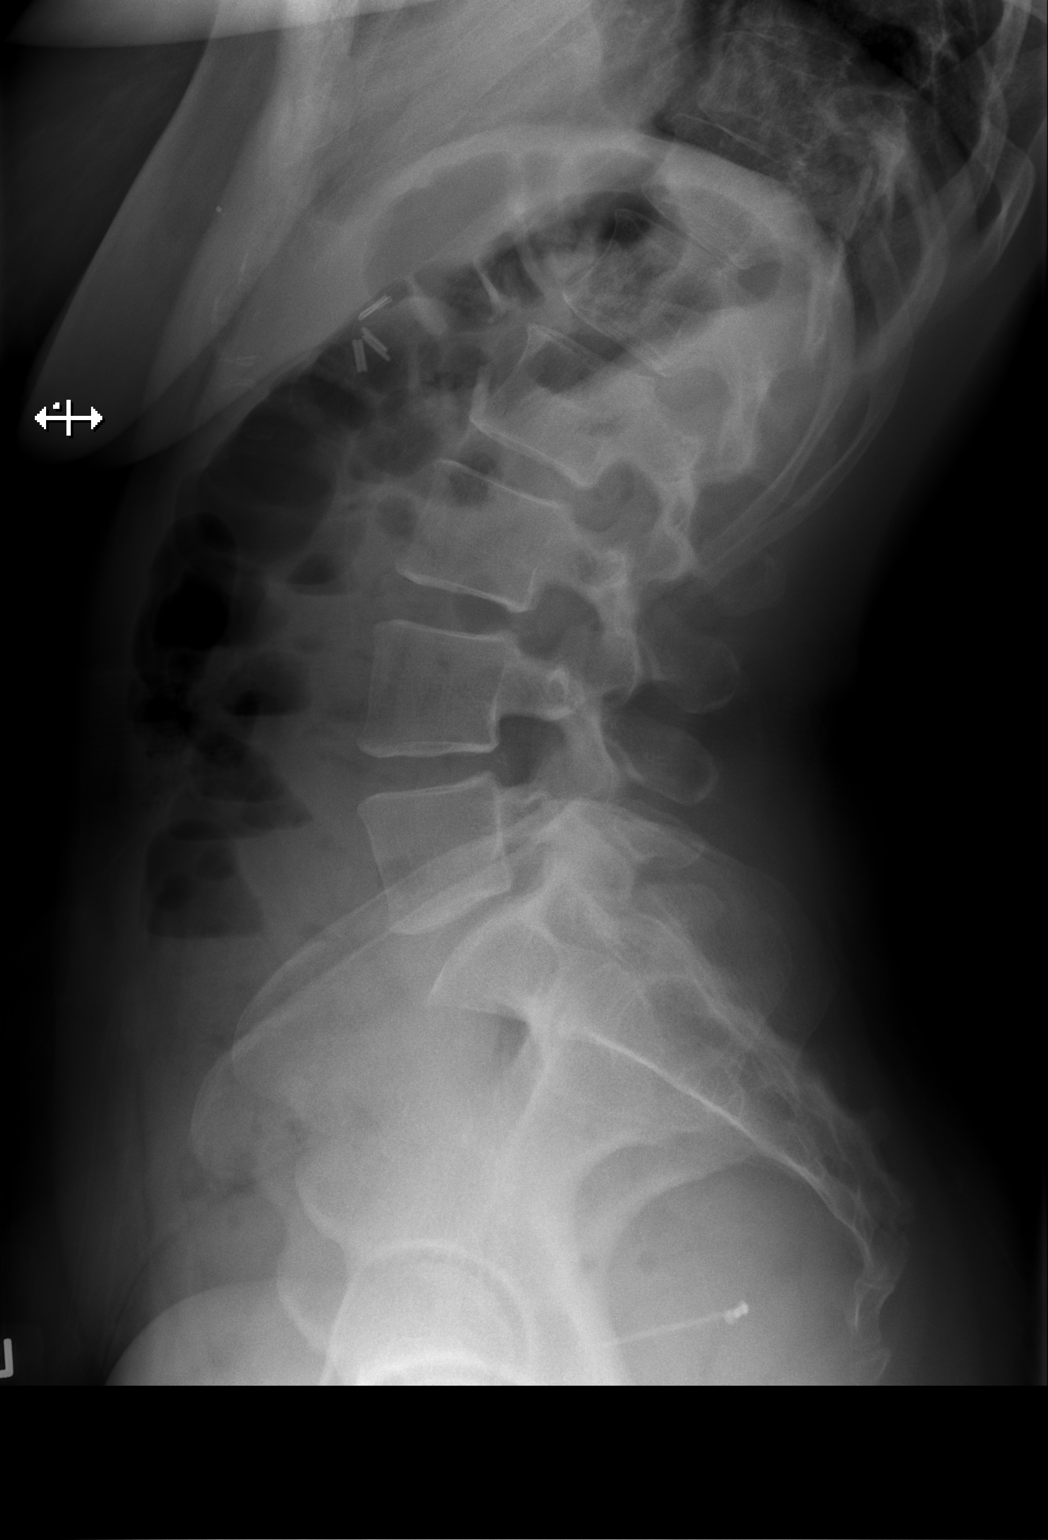

[w lumbar l-5 s-1 spot]
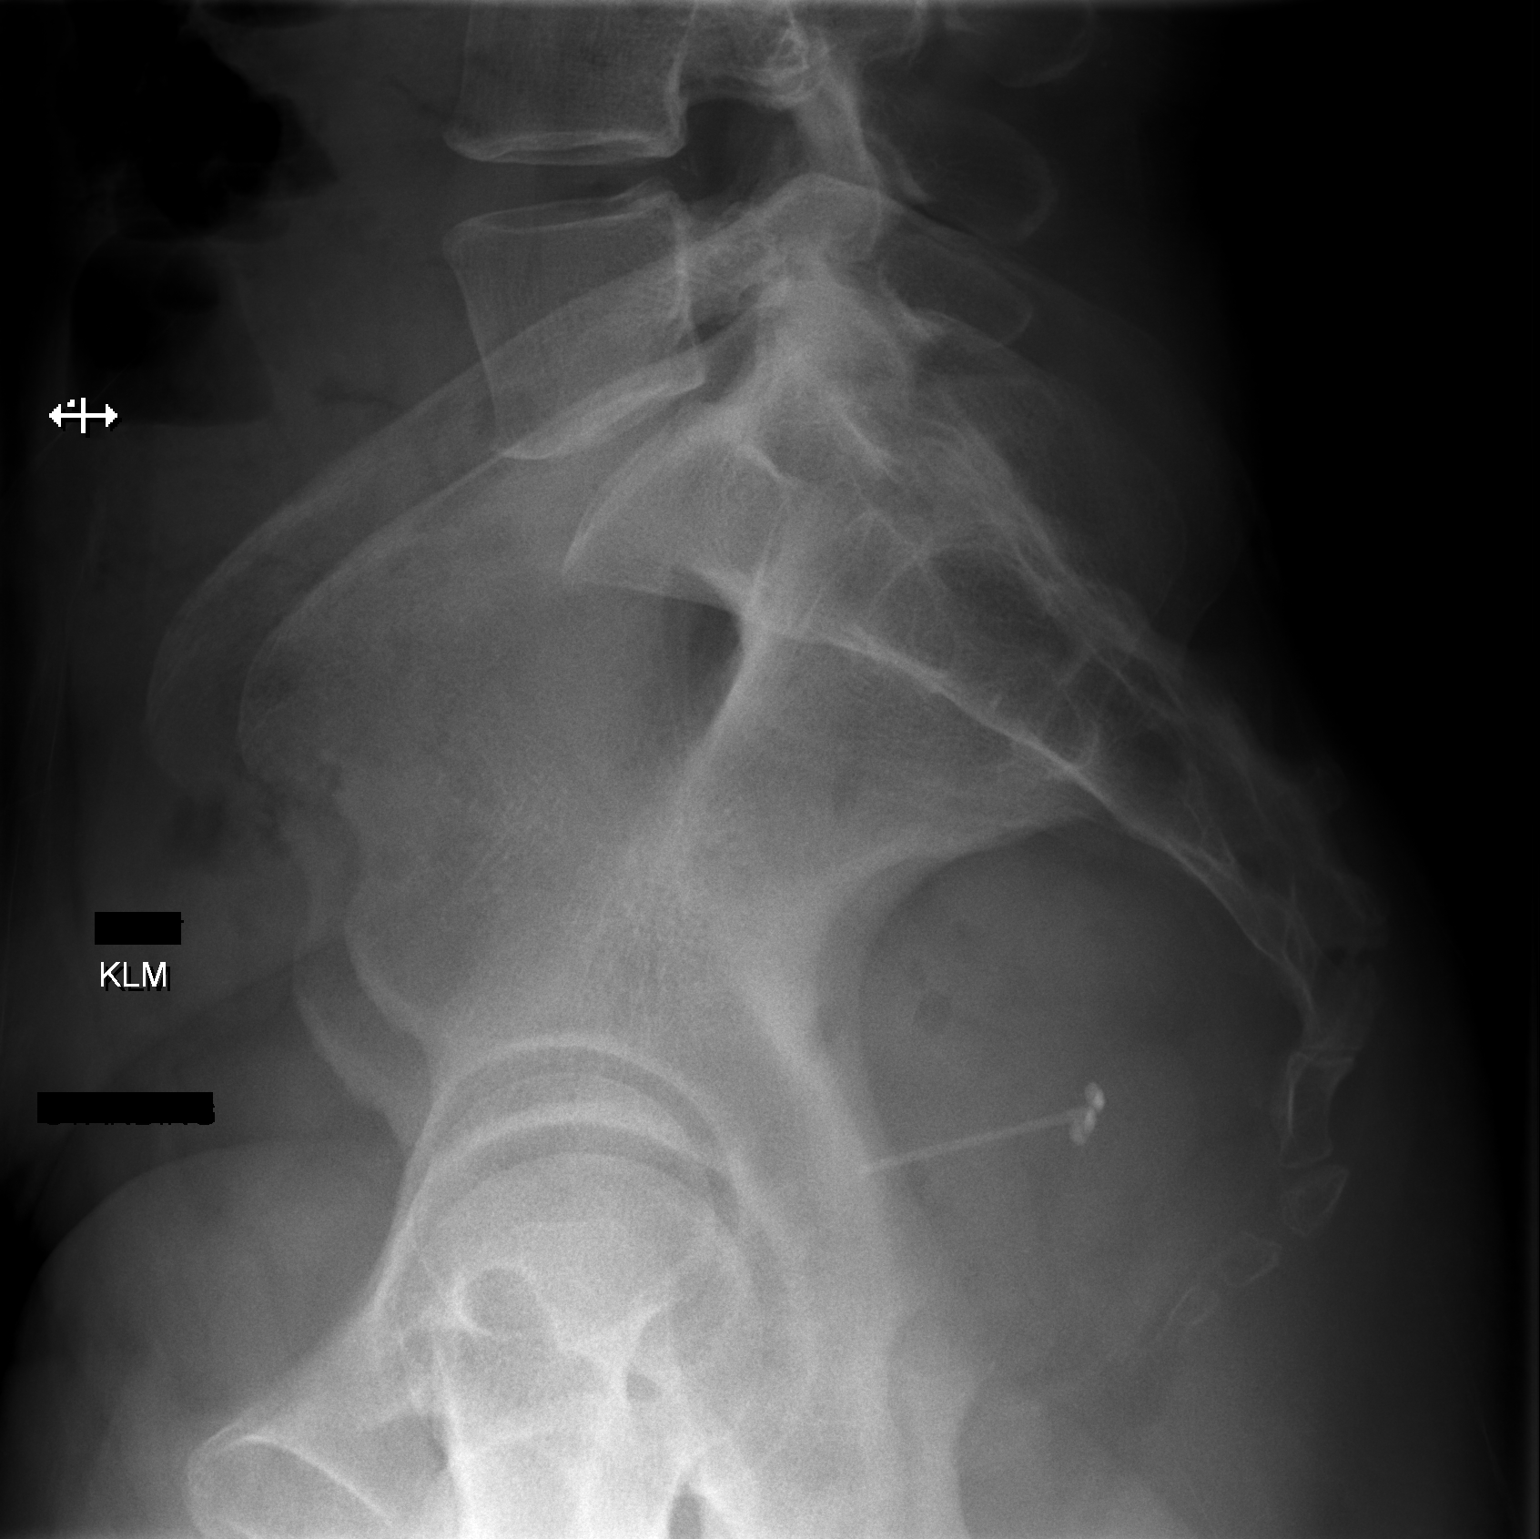

[3 of 3 positions shown; findings below may reference images not displayed]

FINDINGS: There is no evidence of lumbar spine fracture. Accentuated lumbar
lordosis which may be positional. No listhesis. Intervertebral disc
spaces are maintained.
IMPRESSION: Stable compared to 6962.  No acute or focal finding.

## 2024-04-27 ENCOUNTER — Ambulatory Visit: Payer: Self-pay | Admitting: Neurology

## 2024-06-18 ENCOUNTER — Ambulatory Visit: Admitting: Neurology
